# Patient Record
Sex: Male | Born: 1970 | Race: White | Hispanic: No | Marital: Single | Smoking: Never smoker
Health system: Southern US, Community
[De-identification: ages and names within clinical notes are randomized; demographics above are authoritative.]

## PROBLEM LIST (undated history)

## (undated) DIAGNOSIS — T7840XA Allergy, unspecified, initial encounter: Secondary | ICD-10-CM

## (undated) DIAGNOSIS — N200 Calculus of kidney: Secondary | ICD-10-CM

## (undated) DIAGNOSIS — I2510 Atherosclerotic heart disease of native coronary artery without angina pectoris: Secondary | ICD-10-CM

## (undated) DIAGNOSIS — F3341 Major depressive disorder, recurrent, in partial remission: Secondary | ICD-10-CM

## (undated) DIAGNOSIS — K219 Gastro-esophageal reflux disease without esophagitis: Secondary | ICD-10-CM

## (undated) DIAGNOSIS — E785 Hyperlipidemia, unspecified: Secondary | ICD-10-CM

## (undated) DIAGNOSIS — F32A Depression, unspecified: Secondary | ICD-10-CM

## (undated) DIAGNOSIS — Z973 Presence of spectacles and contact lenses: Secondary | ICD-10-CM

## (undated) DIAGNOSIS — I480 Paroxysmal atrial fibrillation: Secondary | ICD-10-CM

## (undated) DIAGNOSIS — M1990 Unspecified osteoarthritis, unspecified site: Secondary | ICD-10-CM

## (undated) DIAGNOSIS — G4730 Sleep apnea, unspecified: Secondary | ICD-10-CM

## (undated) DIAGNOSIS — I219 Acute myocardial infarction, unspecified: Secondary | ICD-10-CM

## (undated) DIAGNOSIS — F329 Major depressive disorder, single episode, unspecified: Secondary | ICD-10-CM

## (undated) HISTORY — DX: Hyperlipidemia, unspecified: E78.5

## (undated) HISTORY — DX: Gastro-esophageal reflux disease without esophagitis: K21.9

## (undated) HISTORY — DX: Sleep apnea, unspecified: G47.30

## (undated) HISTORY — DX: Calculus of kidney: N20.0

## (undated) HISTORY — DX: Major depressive disorder, single episode, unspecified: F32.9

## (undated) HISTORY — PX: CORONARY ANGIOPLASTY WITH STENT PLACEMENT: SHX49

## (undated) HISTORY — DX: Atherosclerotic heart disease of native coronary artery without angina pectoris: I25.10

## (undated) HISTORY — PX: OTHER SURGICAL HISTORY: SHX169

## (undated) HISTORY — DX: Allergy, unspecified, initial encounter: T78.40XA

## (undated) HISTORY — DX: Unspecified osteoarthritis, unspecified site: M19.90

## (undated) HISTORY — DX: Acute myocardial infarction, unspecified: I21.9

## (undated) HISTORY — DX: Depression, unspecified: F32.A

## (undated) HISTORY — DX: Major depressive disorder, recurrent, in partial remission: F33.41

## (undated) HISTORY — PX: TYMPANOSTOMY TUBE PLACEMENT: SHX32

## (undated) HISTORY — PX: SPINE SURGERY: SHX786

---

## 2009-07-30 ENCOUNTER — Emergency Department: Payer: Self-pay | Admitting: Emergency Medicine

## 2012-12-03 ENCOUNTER — Emergency Department: Payer: Self-pay | Admitting: Emergency Medicine

## 2014-05-19 DIAGNOSIS — I219 Acute myocardial infarction, unspecified: Secondary | ICD-10-CM

## 2014-05-19 HISTORY — DX: Acute myocardial infarction, unspecified: I21.9

## 2014-05-20 DIAGNOSIS — I4891 Unspecified atrial fibrillation: Secondary | ICD-10-CM | POA: Insufficient documentation

## 2014-06-16 DIAGNOSIS — I480 Paroxysmal atrial fibrillation: Secondary | ICD-10-CM | POA: Insufficient documentation

## 2014-06-16 DIAGNOSIS — I2510 Atherosclerotic heart disease of native coronary artery without angina pectoris: Secondary | ICD-10-CM | POA: Insufficient documentation

## 2014-06-16 DIAGNOSIS — E785 Hyperlipidemia, unspecified: Secondary | ICD-10-CM | POA: Insufficient documentation

## 2014-06-24 ENCOUNTER — Telehealth: Payer: Self-pay

## 2014-06-24 NOTE — Telephone Encounter (Signed)
Today @ 9:44am  Patient was asked what type of a reaction he was having and to what medication. Patient stated he was having some issues with a rash and that we would not have this medication on our list because we did prescribe it to him. He stated it was a generic to Plavix. After consulting with Dr. Edwena Felty, I then advise him that he should contact the doctor's office that prescribed it to him since this occurred after the input of his cardiac stents and starting this new medication. Patient said, "fine."  [place]

## 2014-07-09 ENCOUNTER — Ambulatory Visit
Admission: RE | Admit: 2014-07-09 | Discharge: 2014-07-09 | Disposition: A | Discharge status: Home / Self Care | Payer: BC Managed Care – PPO | Source: Ambulatory Visit | Attending: Family Medicine | Admitting: Family Medicine

## 2014-07-09 ENCOUNTER — Ambulatory Visit (INDEPENDENT_AMBULATORY_CARE_PROVIDER_SITE_OTHER): Payer: BC Managed Care – PPO | Admitting: Family Medicine

## 2014-07-09 ENCOUNTER — Encounter: Payer: Self-pay | Admitting: Family Medicine

## 2014-07-09 ENCOUNTER — Encounter (INDEPENDENT_AMBULATORY_CARE_PROVIDER_SITE_OTHER): Payer: Self-pay

## 2014-07-09 VITALS — BP 102/64 | HR 53 | Temp 98.7°F | Resp 14 | Ht 70.0 in | Wt 232.5 lb

## 2014-07-09 DIAGNOSIS — F988 Other specified behavioral and emotional disorders with onset usually occurring in childhood and adolescence: Secondary | ICD-10-CM | POA: Insufficient documentation

## 2014-07-09 DIAGNOSIS — M5441 Lumbago with sciatica, right side: Secondary | ICD-10-CM

## 2014-07-09 DIAGNOSIS — M1A4790 Other secondary chronic gout, unspecified ankle and foot, without tophus (tophi): Secondary | ICD-10-CM | POA: Insufficient documentation

## 2014-07-09 DIAGNOSIS — M5440 Lumbago with sciatica, unspecified side: Secondary | ICD-10-CM | POA: Diagnosis not present

## 2014-07-09 DIAGNOSIS — F3341 Major depressive disorder, recurrent, in partial remission: Secondary | ICD-10-CM

## 2014-07-09 DIAGNOSIS — G4733 Obstructive sleep apnea (adult) (pediatric): Secondary | ICD-10-CM | POA: Insufficient documentation

## 2014-07-09 DIAGNOSIS — N200 Calculus of kidney: Secondary | ICD-10-CM | POA: Insufficient documentation

## 2014-07-09 DIAGNOSIS — M489 Spondylopathy, unspecified: Secondary | ICD-10-CM | POA: Insufficient documentation

## 2014-07-09 DIAGNOSIS — R42 Dizziness and giddiness: Secondary | ICD-10-CM | POA: Diagnosis not present

## 2014-07-09 DIAGNOSIS — J45909 Unspecified asthma, uncomplicated: Secondary | ICD-10-CM | POA: Diagnosis not present

## 2014-07-09 DIAGNOSIS — E785 Hyperlipidemia, unspecified: Secondary | ICD-10-CM | POA: Insufficient documentation

## 2014-07-09 DIAGNOSIS — M545 Low back pain: Secondary | ICD-10-CM | POA: Diagnosis present

## 2014-07-09 DIAGNOSIS — I2510 Atherosclerotic heart disease of native coronary artery without angina pectoris: Secondary | ICD-10-CM | POA: Insufficient documentation

## 2014-07-09 DIAGNOSIS — IMO0001 Reserved for inherently not codable concepts without codable children: Secondary | ICD-10-CM | POA: Insufficient documentation

## 2014-07-09 DIAGNOSIS — I252 Old myocardial infarction: Secondary | ICD-10-CM | POA: Insufficient documentation

## 2014-07-09 DIAGNOSIS — R21 Rash and other nonspecific skin eruption: Secondary | ICD-10-CM | POA: Diagnosis not present

## 2014-07-09 DIAGNOSIS — G4730 Sleep apnea, unspecified: Secondary | ICD-10-CM | POA: Insufficient documentation

## 2014-07-09 DIAGNOSIS — K219 Gastro-esophageal reflux disease without esophagitis: Secondary | ICD-10-CM | POA: Insufficient documentation

## 2014-07-09 HISTORY — DX: Major depressive disorder, recurrent, in partial remission: F33.41

## 2014-07-09 IMAGING — CR DG LUMBAR SPINE COMPLETE 4+V
1 series · 5 of 5 positions shown · non-contrast
Comparison: None

CLINICAL DATA: Lower back pain for 2-3 months, asthma and playing
tennis recently, midline low back pain with RIGHT-sided sciatica

EXAM:
LUMBAR SPINE - COMPLETE 4+ VIEW

[Series 1: dg lumbar spine complete 4 +v · 0.14mm/px · 5 of 5 slices shown]
[im 1/5]
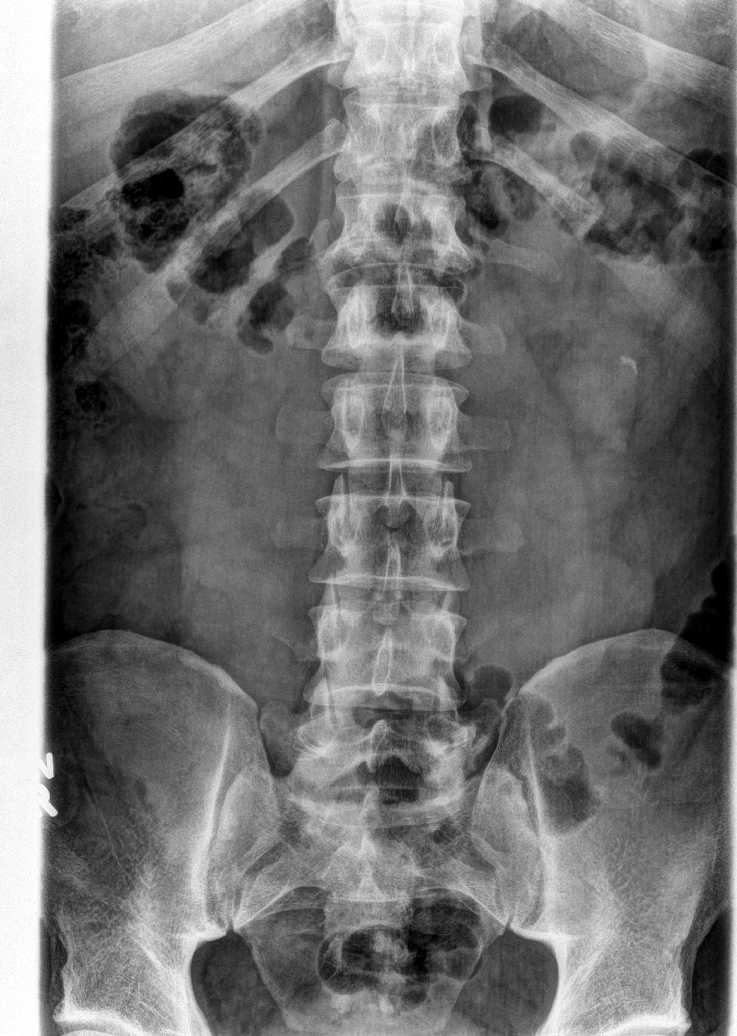
[im 2/5]
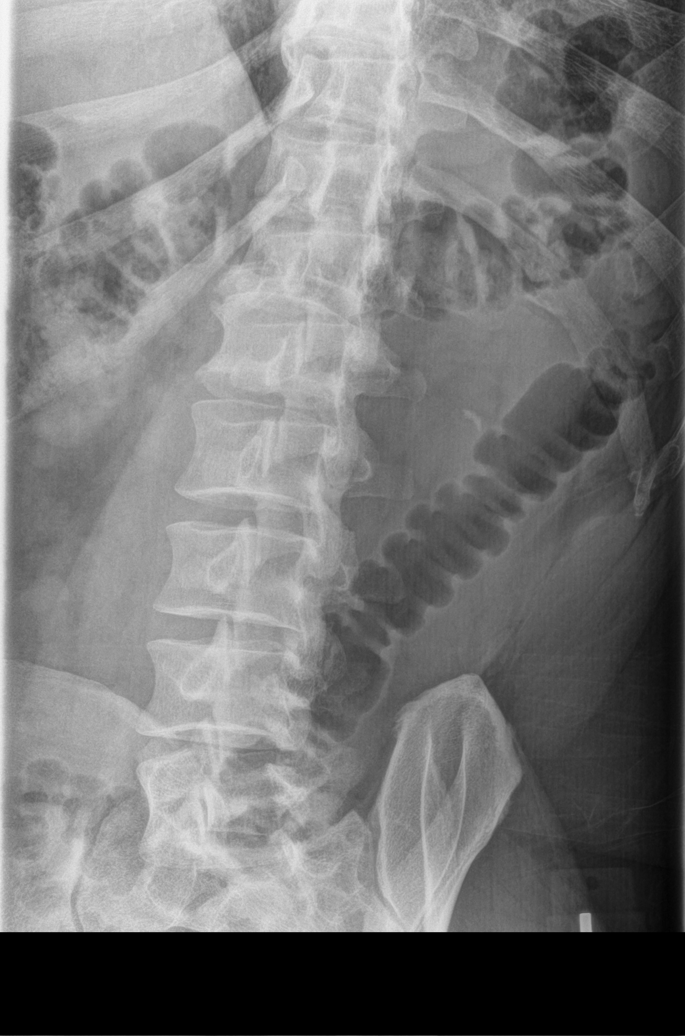
[im 3/5]
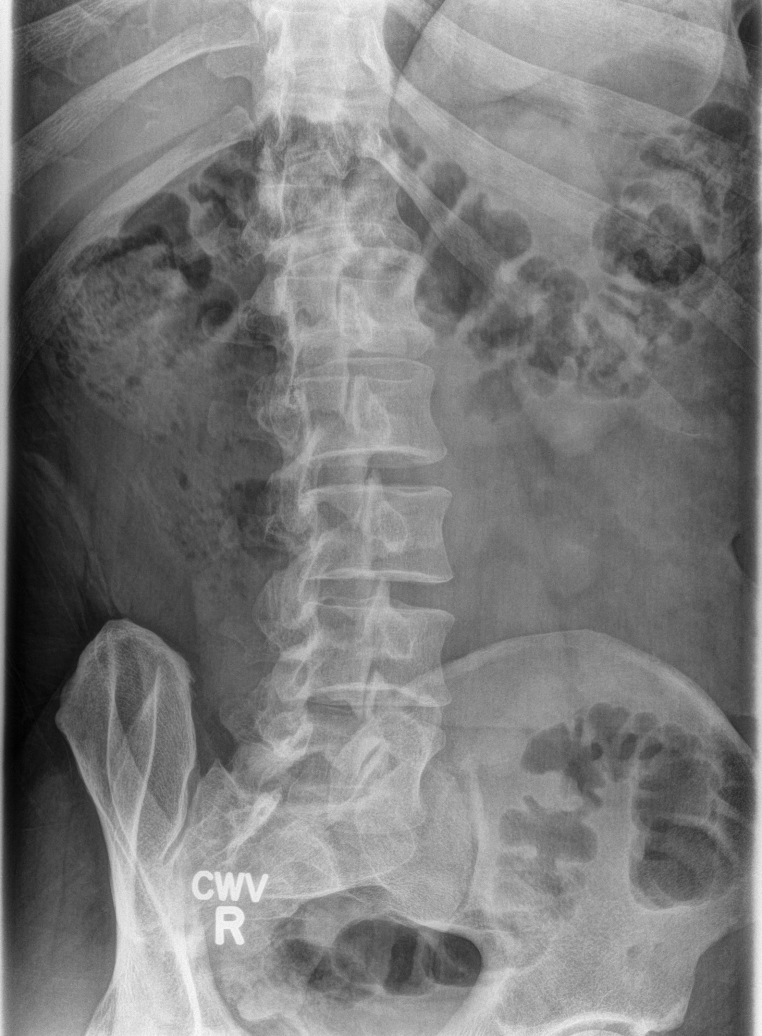
[im 4/5]
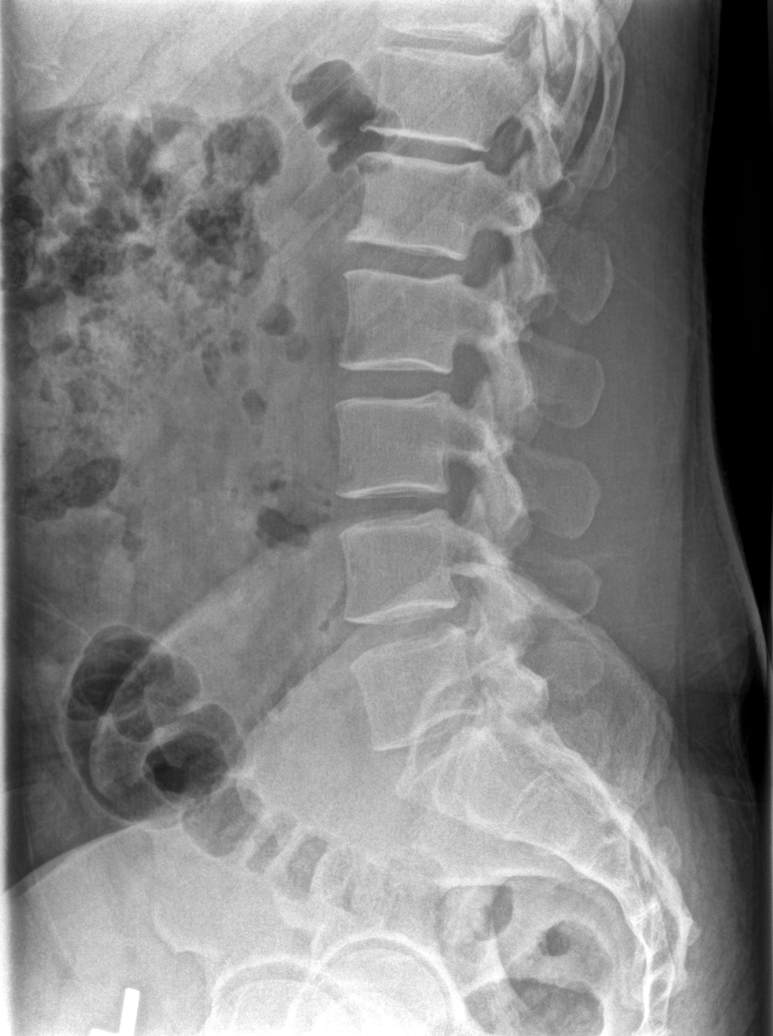
[im 5/5]
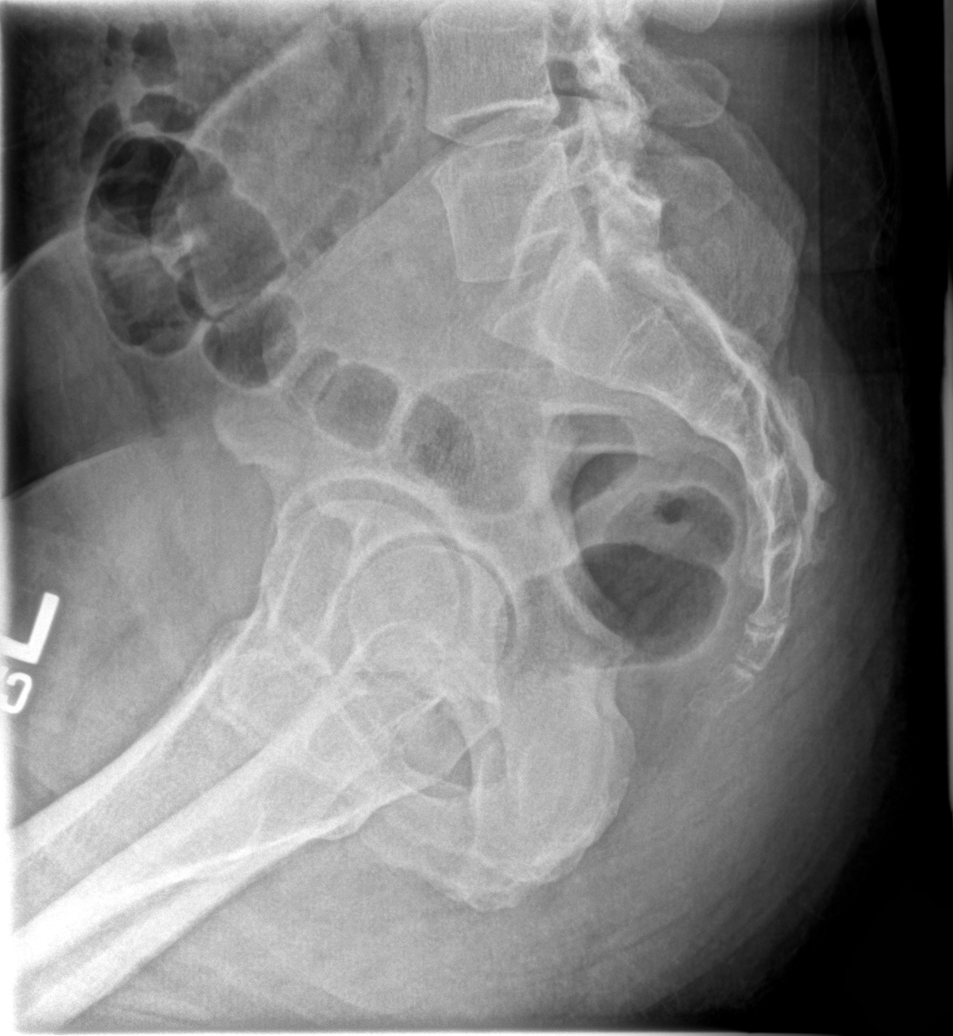

[5 of 5 positions shown; findings below may reference images not displayed]

FINDINGS: Hypoplastic last rib pair.

5 non-rib-bearing lumbar vertebra.

Vertebral body and disc space heights maintained.

Tiny inferior endplate spurs at T12 and L1.

No acute fracture, subluxation or bone destruction.

No spondylolysis.

SI joints symmetric.

10 x 3 mm calculus projects over inferior pole LEFT kidney.
IMPRESSION: No acute lumbar spine abnormalities.

10 x 3 mm LEFT renal calculus.

## 2014-07-09 NOTE — Progress Notes (Signed)
Name: Melvin Hatfield   MRN: [id number]    DOB: May 26, 1970   Date:07/09/2014       Progress Note  Subjective  Chief Complaint  Chief Complaint  Patient presents with  . Allergic Reaction    patient thinks it may be a reaction to a switch in medication.   . Dizziness    patient states he gets light-headed everytime he stands up  . Fatigue    patient states he has no energy    HPI  Rash: Patient complains of rash involving the abdomen, back, generalized, bilateral lower leg, torso and trunk. Rash started 3 weeks ago. Appearance of rash at onset: Color of lesion(s): pink. Rash has changed over time Initial distribution: less involved on bilateral arms.  Discomfort associated with rash: causes no discomfort.  Associated symptoms: none. Denies: abdominal pain, arthralgia, congestion, cough, crankiness, decrease in appetite, decrease in energy level, fever, headache, irritability, myalgia, nausea, sore throat and vomiting. Patient has had previous evaluation of rash. Patient has not had previous treatment.  Patient has not had contacts with similar rash. Patient has not identified precipitant. Patient has had new exposures: medications.  Joint/Muscle Pain: Patient complains of arthralgias for which has been present for several months. Pain is located in lower back, is described as aching, throbbing and tight band, and is intermittent .  Associated symptoms include: decreased range of motion.  The patient has planned to see Chiropracter.  Related to injury:   Playing table tennis agressively.  Patient Active Problem List   Diagnosis Date Noted  . GERD without esophagitis 07/09/2014  . Sleep apnea in adult 07/09/2014  . 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 Obesity, Class II, BMI 35-39.9, with comorbidity 07/09/2014  . Hyperlipidemia LDL goal <70 07/09/2014  . History of myocardial infarct at age less than 60 years 07/09/2014  .  Coronary artery disease involving native coronary artery without angina pectoris 07/09/2014  . 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 Depression 07/09/2014  . CAD in native artery 06/16/2014  . Dyslipidemia 06/16/2014  . A-fib 06/16/2014  . Atrial fibrillation with rapid ventricular response 05/20/2014    History  Substance Use Topics  . Smoking status: Never Smoker   . Smokeless tobacco: Not on file  . Alcohol Use: No     Current outpatient prescriptions:  .  amiodarone (PACERONE) 200 MG tablet, Take 200 mg by mouth., Disp: , Rfl:  .  aspirin 81 MG chewable tablet, Chew 81 mg by mouth., Disp: , Rfl:  .  atorvastatin (LIPITOR) 20 MG tablet, Take 20 mg by mouth., Disp: , Rfl:  .  clopidogrel (PLAVIX) 75 MG tablet, Take 75 mg by mouth., Disp: , Rfl:  .  sertraline (ZOLOFT) 100 MG tablet, Take 50 mg by mouth., Disp: , Rfl:   Past Surgical History  Procedure Laterality Date  . Coronary angioplasty with stent placement    . Spine surgery    . Reconstructed ear drum    . Tympanostomy tube placement      patient states he has had 6    Family History  Problem Relation Age of Onset  . Cancer Mother   . Depression Mother     No Known Allergies   Review of Systems  Ten systems reviewed and is negative except as mentioned in HPI.    Objective  BP 102/64 mmHg  Pulse 53  Temp(Src) 98.7 F (37.1 C) (Oral)  Resp 14  Ht  (1.778 m)  Wt 232 lb 8 oz (105.461 kg)  BMI 33.36 kg/m2  SpO2 96%  Physical Exam  Constitutional: Patient appears well-developed and well-nourished. In no distress.  Neck: Normal range of motion. Neck supple. No JVD present. No thyromegaly present.  Cardiovascular: Normal rate, regular rhythm and normal heart sounds.  No murmur heard.  Pulmonary/Chest: Effort normal and breath sounds normal. No respiratory distress. Musculoskeletal: Normal range of motion bilateral UE and LE, no joint effusions. Lumbar spine no step  off, right paraspinal muscle spasm and tenderness noted. Normal truncal movements. Peripheral vascular: Bilateral LE no edema. Neurological: CN II-XII grossly intact with no focal deficits. Alert and oriented to person, place, and time. Coordination, balance, strength, speech and gait are normal.  Skin: Skin is warm and dry. Pink fine macular rash distributed on bilateral legs, abdomen and back. No warmth, scaly patches, ulcers, herald patches.  Psychiatric: Patient has a normal mood and affect. Behavior is normal in office today. Judgment and thought content normal in office today.   Assessment & Plan  1. Rash and nonspecific skin eruption Very mild, may be due to new medications but difficult to pinpoint which of his medications it could be. I do not recommend stopping any of his medications due to recent MI history. Continue with medications and monitor his rash as it is mild and may resolve on it's own. I do not feel that there is a systemic internal involvement at this time.  2. Midline low back pain with right-sided sciatica X-ray of lumbar spine ordered.   - DG Lumbar Spine Complete; Future  3. Dizziness Likely due to bradycardia, recent MI, amiodarone. Be mindful of position changes, keep well hydrated.

## 2014-07-09 NOTE — Patient Instructions (Signed)
Back Exercises  Back exercises help treat and prevent back injuries. The goal of back exercises is to increase the strength of your abdominal and back muscles and the flexibility of your back. These exercises should be started when you no longer have back pain. Back exercises include:  · Pelvic Tilt. Lie on your back with your knees bent. Tilt your pelvis until the lower part of your back is against the floor. Hold this position 5 to 10 sec and repeat 5 to 10 times.  · Knee to Chest. Pull first 1 knee up against your chest and hold for 20 to 30 seconds, repeat this with the other knee, and then both knees. This may be done with the other leg straight or bent, whichever feels better.  · Sit-Ups or Curl-Ups. Bend your knees 90 degrees. Start with tilting your pelvis, and do a partial, slow sit-up, lifting your trunk only 30 to 45 degrees off the floor. Take at least 2 to 3 seconds for each sit-up. Do not do sit-ups with your knees out straight. If partial sit-ups are difficult, simply do the above but with only tightening your abdominal muscles and holding it as directed.  · Hip-Lift. Lie on your back with your knees flexed 90 degrees. Push down with your feet and shoulders as you raise your hips a couple inches off the floor; hold for 10 seconds, repeat 5 to 10 times.  · Back arches. Lie on your stomach, propping yourself up on bent elbows. Slowly press on your hands, causing an arch in your low back. Repeat 3 to 5 times. Any initial stiffness and discomfort should lessen with repetition over time.  · Shoulder-Lifts. Lie face down with arms beside your body. Keep hips and torso pressed to floor as you slowly lift your head and shoulders off the floor.  Do not overdo your exercises, especially in the beginning. Exercises may cause you some mild back discomfort which lasts for a few minutes; however, if the pain is more severe, or lasts for more than 15 minutes, do not continue exercises until you see your caregiver.  Improvement with exercise therapy for back problems is slow.   See your caregivers for assistance with developing a proper back exercise program.  Document Released: 02/17/2004 Document Revised: 04/03/2011 Document Reviewed: 11/10/2010  ExitCare® Patient Information ©2015 [employer]. This information is not intended to replace advice given to you by your health care provider. Make sure you discuss any questions you have with your health care provider.    Chronic Back Pain   When back pain lasts longer than 3 months, it is called chronic back pain. People with chronic back pain often go through certain periods that are more intense (flare-ups).   CAUSES  Chronic back pain can be caused by wear and tear (degeneration) on different structures in your back. These structures include:  · The bones of your spine (vertebrae) and the joints surrounding your spinal cord and nerve roots (facets).  · The strong, fibrous tissues that connect your vertebrae (ligaments).  Degeneration of these structures may result in pressure on your nerves. This can lead to constant pain.  HOME CARE INSTRUCTIONS  · Avoid bending, heavy lifting, prolonged sitting, and activities which make the problem worse.  · Take brief periods of rest throughout the day to reduce your pain. Lying down or standing usually is better than sitting while you are resting.  · Take over-the-counter or prescription medicines only as directed by your caregiver.  SEEK   IMMEDIATE MEDICAL CARE IF:   · You have weakness or numbness in one of your legs or feet.  · You have trouble controlling your bladder or bowels.  · You have nausea, vomiting, abdominal pain, shortness of breath, or fainting.  Document Released: 02/17/2004 Document Revised: 04/03/2011 Document Reviewed: 12/24/2010  ExitCare® Patient Information ©2015 [employer]. This information is not intended to replace advice given to you by your health care provider. Make sure you discuss any questions you have  with your health care provider.

## 2014-08-03 ENCOUNTER — Other Ambulatory Visit: Payer: Self-pay

## 2014-08-03 ENCOUNTER — Telehealth: Payer: Self-pay | Admitting: Family Medicine

## 2014-08-03 NOTE — Telephone Encounter (Signed)
Patient would like to know what side was the kidney stone found on because he has been having pressure for about 6 months on the left side. Please advise. It is okay to leave a detailed message if he does not answer.

## 2014-08-03 NOTE — Telephone Encounter (Signed)
Contacted patient to inform him that the renal stone was on the left side. Patient then stated that he has been having a lot of symptoms so I asked if he could come in for am office visit. Per his request patient was not scheduled until 08/17/14 at 8:15am.

## 2014-08-17 ENCOUNTER — Ambulatory Visit (INDEPENDENT_AMBULATORY_CARE_PROVIDER_SITE_OTHER): Payer: BC Managed Care – PPO | Admitting: Family Medicine

## 2014-08-17 ENCOUNTER — Encounter: Payer: Self-pay | Admitting: Family Medicine

## 2014-08-17 VITALS — BP 106/70 | HR 47 | Temp 97.5°F | Resp 14 | Ht 70.0 in | Wt 231.4 lb

## 2014-08-17 DIAGNOSIS — L270 Generalized skin eruption due to drugs and medicaments taken internally: Secondary | ICD-10-CM | POA: Diagnosis not present

## 2014-08-17 DIAGNOSIS — N200 Calculus of kidney: Secondary | ICD-10-CM | POA: Insufficient documentation

## 2014-08-17 HISTORY — DX: Calculus of kidney: N20.0

## 2014-08-17 NOTE — Patient Instructions (Signed)

## 2014-08-17 NOTE — Progress Notes (Signed)
Name: Melvin Hatfield   MRN: [id number]    DOB: [birth year]   Date:08/17/2014       Progress Note  Subjective  Chief Complaint  Chief Complaint  Patient presents with  . Nephrolithiasis    patient is here because his symptoms persists from 07/10/14, when a kidney stone was found.    HPI  Melvin Hatfield presents with ongoing back and flank pain and he is concerned it may be related to incidental calculus found on left kidney measuring 10x68mm. He notes not passing anything.  Pain is located in left sides middle to lower back, is described as aching, throbbing and tight band, and is intermittent . Associated symptoms include: decreased range of motion. The patient has planned to see Chiropracter at our last visit but decided against this as Lumbar xray was not significant for spine abnormalities. Playing table tennis aggressively as usual. No unusual urinary complaints. Symptoms are ongoing for 6months now.  Patient Active Problem List   Diagnosis Date Noted  . Left nephrolithiasis 08/17/2014  . Drug-induced skin rash 08/17/2014  . GERD without esophagitis 07/09/2014  . Sleep apnea in adult 07/09/2014  . 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 Obesity, Class II, BMI 35-39.9, with comorbidity 07/09/2014  . Hyperlipidemia LDL goal <70 07/09/2014  . History of myocardial infarct at age less than 60 years 07/09/2014  . Coronary artery disease involving native coronary artery without angina pectoris 07/09/2014  . 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 Depression 07/09/2014  . CAD in native artery 06/16/2014  . Dyslipidemia 06/16/2014  . A-fib 06/16/2014  . Atrial fibrillation with rapid ventricular response 05/20/2014    History  Substance Use Topics  . Smoking status: Never Smoker   . Smokeless tobacco: Not on file  . Alcohol Use: No     Current outpatient prescriptions:  .  amiodarone  (PACERONE) 200 MG tablet, Take 200 mg by mouth., Disp: , Rfl:  .  aspirin 81 MG chewable tablet, Chew 81 mg by mouth., Disp: , Rfl:  .  atorvastatin (LIPITOR) 20 MG tablet, Take 20 mg by mouth., Disp: , Rfl:  .  clopidogrel (PLAVIX) 75 MG tablet, , Disp: , Rfl:  .  sertraline (ZOLOFT) 100 MG tablet, Take 50 mg by mouth., Disp: , Rfl:   Past Surgical History  Procedure Laterality Date  . Coronary angioplasty with stent placement    . Spine surgery    . Reconstructed ear drum    . Tympanostomy tube placement      patient states he has had 6    Family History  Problem Relation Age of Onset  . Cancer Mother   . Depression Mother     No Known Allergies   Review of Systems  CONSTITUTIONAL: No significant weight changes, fever, chills, weakness or fatigue.  HEENT:  - Eyes: No visual changes.  - Ears: No auditory changes. No pain.  - Nose: No sneezing, congestion, runny nose. - Throat: No sore throat. No changes in swallowing. SKIN: Continued rash CARDIOVASCULAR: No chest pain, chest pressure or chest discomfort. No palpitations or edema.  RESPIRATORY: No shortness of breath, cough or sputum.  GASTROINTESTINAL: No anorexia, nausea, vomiting. No changes in bowel habits. No abdominal pain or blood.  GENITOURINARY: No dysuria. No frequency. No discharge.  NEUROLOGICAL: No headache, dizziness, syncope, paralysis, ataxia, numbness or tingling in the extremities. No memory changes. No change in bowel or bladder  control.  MUSCULOSKELETAL: Left flank pain. HEMATOLOGIC: No anemia, bleeding or bruising.  LYMPHATICS: No enlarged lymph nodes.  PSYCHIATRIC: No change in mood. No change in sleep pattern.  ENDOCRINOLOGIC: No reports of sweating, cold or heat intolerance. No polyuria or polydipsia.      Objective  BP 106/70 mmHg  Pulse 47  Temp(Src) 97.5 F (36.4 C) (Oral)  Resp 14  Ht 5\' 10"  (1.778 m)  Wt 231 lb 6.4 oz (104.962 kg)  BMI 33.20 kg/m2  SpO2 95% Body mass index is 33.2  kg/(m^2).  Physical Exam  Constitutional: Patient appears well-developed and well-nourished. In no distress.  Neck: Normal range of motion. Neck supple. No JVD present. No thyromegaly present.  Cardiovascular: Normal rate, regular rhythm and normal heart sounds. No murmur heard.  Pulmonary/Chest: Effort normal and breath sounds normal. No respiratory distress. Musculoskeletal: Normal range of motion bilateral UE and LE, no joint effusions. Lumbar spine no step off, right paraspinal muscle spasm and tenderness noted. Normal truncal movements. Some mild vague pain reproduced upon pounding over left flank. Peripheral vascular: Bilateral LE no edema. Neurological: CN II-XII grossly intact with no focal deficits. Alert and oriented to person, place, and time. Coordination, balance, strength, speech and gait are normal.  Skin: Skin is warm and dry. Pink fine macular rash distributed on bilateral legs, abdomen and back. No warmth, scaly patches, ulcers, herald patches.  Psychiatric: Patient has a normal mood and affect. Behavior is normal in office today. Judgment and thought content normal in office today.  Assessment & Plan  1. Left nephrolithiasis Refer to Urology to address 10x20mm left kidney stone.  - Ambulatory referral to Urology  2. Drug-induced skin rash Stable

## 2014-08-18 ENCOUNTER — Telehealth: Payer: Self-pay | Admitting: Family Medicine

## 2014-08-18 NOTE — Telephone Encounter (Signed)
Pt called in stating that he did not come by to check out yesterday, but wanted to know if we knew that you where referring him out. When I told him that I see that his provider had put in a order for him to go to urology, he then states that it was suppose to nephrology due to a kidney stone. He wanted me to confirm with you which specialist you are wanting him to see.

## 2014-08-18 NOTE — Telephone Encounter (Signed)
Contacted this patient to inform him that he was being referred to the appropriate specialist and that someone from that office will give him a call to set an appt.

## 2014-08-18 NOTE — Telephone Encounter (Signed)
Please clarify to Melvin Hatfield that he needs to see a UROLOGIST for kidney stones and Nephrologists are for poor kidney functioning. He has been referred to [facility] appropriately.

## 2014-09-09 ENCOUNTER — Telehealth: Payer: Self-pay | Admitting: Family Medicine

## 2014-09-09 ENCOUNTER — Other Ambulatory Visit: Payer: Self-pay | Admitting: Family Medicine

## 2014-09-09 DIAGNOSIS — N200 Calculus of kidney: Secondary | ICD-10-CM

## 2014-09-09 MED ORDER — OXYCODONE-ACETAMINOPHEN 5-325 MG PO TABS: 1.0000 | ORAL_TABLET | Freq: Four times a day (QID) | ORAL | Status: DC | PRN

## 2014-09-09 NOTE — Telephone Encounter (Signed)
Patient was informed that rx is ready for pick up.

## 2014-09-09 NOTE — Telephone Encounter (Signed)
Please let the patient know I have printed out Rx for something stronger than Ibuprofen, called Percocet 5-325 mg one every 6 hrs as needed for pain, but the prescription has to be picked up it can't be sent to the pharmacy directly.

## 2014-09-09 NOTE — Telephone Encounter (Signed)
Patient is about to pass a kidney stone and is requesting medication to help relieve the pain. He is already taking ibuprofen but would like something else. Please send to [email or web address]

## 2014-09-22 ENCOUNTER — Ambulatory Visit (INDEPENDENT_AMBULATORY_CARE_PROVIDER_SITE_OTHER): Payer: BC Managed Care – PPO | Admitting: Urology

## 2014-09-22 ENCOUNTER — Encounter: Payer: Self-pay | Admitting: Urology

## 2014-09-22 VITALS — BP 118/78 | HR 50 | Ht 70.0 in | Wt 230.3 lb

## 2014-09-22 DIAGNOSIS — N200 Calculus of kidney: Secondary | ICD-10-CM

## 2014-09-22 LAB — MICROSCOPIC EXAMINATION
Bacteria, UA: NONE SEEN
Epithelial Cells (non renal): NONE SEEN /hpf (ref 0–10)
WBC UA: NONE SEEN /HPF (ref 0–?)

## 2014-09-22 LAB — URINALYSIS, COMPLETE
BILIRUBIN UA: NEGATIVE
Glucose, UA: NEGATIVE
Ketones, UA: NEGATIVE
Leukocytes, UA: NEGATIVE
NITRITE UA: NEGATIVE
PH UA: 6 (ref 5.0–7.5)
PROTEIN UA: NEGATIVE
Specific Gravity, UA: 1.02 (ref 1.005–1.030)
UUROB: 0.2 mg/dL (ref 0.2–1.0)

## 2014-09-22 NOTE — Progress Notes (Signed)
09/22/2014 9:38 PM   Domingo Madeira 07/21/1970 [id number]  Referring provider: Edwena Felty, MD [address] [place], [place] [address]  Chief Complaint  Patient presents with  . Nephrolithiasis    New Patient    HPI:  Patient is a 44 year old white male with a history of a 10 mm left lower pole renal stone that was found incidentally on back x-rays on 07/09/2014.  He states he's been having back pain on and off for the last several months. This is what prompted the lumbar spine x-rays. Over the last 10 days, he's been experiencing pain in the left groin and radiated to the penis. He is not having any flank pain. He is having intermittent urine stream, but he denies any dysuria, gross hematuria or urinary urgency.  He does have a prior history of nephrolithiasis. He states he passed a stone spontaneously about 5 years ago.  He currently is not having any fever, chills, nausea or vomiting.   PMH: Past Medical History  Diagnosis Date  . Allergy   . Depression   . Sleep apnea   . CHF (congestive heart failure)     stents in placed  . GERD (gastroesophageal reflux disease)   . Hyperlipidemia   . Arthritis   . Left nephrolithiasis 08/17/2014    Surgical History: Past Surgical History  Procedure Laterality Date  . Coronary angioplasty with stent placement    . Spine surgery    . Reconstructed ear drum    . Tympanostomy tube placement      patient states he has had 6    Home Medications:    Medication List       This list is accurate as of: 09/22/14  9:38 PM.  Always use your most recent med list.               amiodarone 200 MG tablet  Commonly known as:  PACERONE  Take 200 mg by mouth.     aspirin 81 MG chewable tablet  Chew 81 mg by mouth.     atorvastatin 20 MG tablet  Commonly known as:  LIPITOR  Take 20 mg by mouth.     clopidogrel 75 MG tablet  Commonly known as:  PLAVIX     oxyCODONE-acetaminophen 5-325 MG per tablet  Commonly known  as:  ROXICET  Take 1 tablet by mouth every 6 (six) hours as needed for severe pain.     sertraline 100 MG tablet  Commonly known as:  ZOLOFT  Take 50 mg by mouth.        Allergies: No Known Allergies  Family History: Family History  Problem Relation Age of Onset  . Cancer Mother   . Depression Mother     Social History:  reports that he has never smoked. He does not have any smokeless tobacco history on file. He reports that he does not drink alcohol or use illicit drugs.  ROS: UROLOGY Frequent Urination?: No Hard to postpone urination?: No Burning/pain with urination?: No Get up at night to urinate?: No Leakage of urine?: No Urine stream starts and stops?: Yes Trouble starting stream?: No Do you have to strain to urinate?: No Blood in urine?: No Urinary tract infection?: No Sexually transmitted disease?: No Injury to kidneys or bladder?: No Painful intercourse?: No Weak stream?: No Erection problems?: No Penile pain?: No  Gastrointestinal Nausea?: No Vomiting?: No Indigestion/heartburn?: No Diarrhea?: No Constipation?: No  Constitutional Fever: No Night sweats?: No Weight loss?: No Fatigue?:  No  Skin Skin rash/lesions?: No Itching?: No  Eyes Blurred vision?: No Double vision?: No  Ears/Nose/Throat Sore throat?: No Sinus problems?: No  Hematologic/Lymphatic Swollen glands?: No Easy bruising?: No  Cardiovascular Leg swelling?: No Chest pain?: No  Respiratory Cough?: No Shortness of breath?: No  Endocrine Excessive thirst?: No  Musculoskeletal Back pain?: No Joint pain?: No  Neurological Headaches?: No Dizziness?: No  Psychologic Depression?: No Anxiety?: No  Physical Exam: BP 118/78 mmHg  Pulse 50  Ht 5\' 10"  (1.778 m)  Wt 230 lb 4.8 oz (104.463 kg)  BMI 33.04 kg/m2  Constitutional:  Alert and oriented, No acute distress. HEENT: [place] AT, moist mucus membranes.  Trachea midline, no masses. Cardiovascular: No clubbing,  cyanosis, or edema. Respiratory: Normal respiratory effort, no increased work of breathing. GI: Abdomen is soft, nontender, nondistended, no abdominal masses GU: No CVA tenderness. GU: Patient with circumcised phallus.  Urethral meatus is patent.  No penile discharge. No penile lesions or rashes. Scrotum without lesions, cysts, rashes and/or edema.  Testicles are located scrotally bilaterally. No masses are appreciated in the testicles. Left and right epididymis are normal. Rectal: Patient with  normal sphincter tone. Perineum without scarring or rashes. No rectal masses are appreciated. Prostate is approximately 40 grams, no nodules are appreciated. Seminal vesicles are normal. Skin: No rashes, bruises or suspicious lesions. Lymph: No cervical or inguinal adenopathy. Neurologic: Grossly intact, no focal deficits, moving all 4 extremities. Psychiatric: Normal mood and affect.  Laboratory Data: Results for orders placed or performed in visit on 09/22/14  Microscopic Examination  Result Value Ref Range   WBC, UA None seen 0 -  5 /hpf   RBC, UA 0-2 0 -  2 /hpf   Epithelial Cells (non renal) None seen 0 - 10 /hpf   Mucus, UA Present (A) Not Estab.   Bacteria, UA None seen None seen/Few  Urinalysis, Complete  Result Value Ref Range   Specific Gravity, UA 1.020 1.005 - 1.030   pH, UA 6.0 5.0 - 7.5   Color, UA Yellow Yellow   Appearance Ur Clear Clear   Leukocytes, UA Negative Negative   Protein, UA Negative Negative/Trace   Glucose, UA Negative Negative   Ketones, UA Negative Negative   RBC, UA 1+ (A) Negative   Bilirubin, UA Negative Negative   Urobilinogen, Ur 0.2 0.2 - 1.0 mg/dL   Nitrite, UA Negative Negative   Microscopic Examination See below:      Pertinent Imaging: CLINICAL DATA: Lower back pain for 2-3 months, asthma and playing tennis recently, midline low back pain with RIGHT-sided sciatica  EXAM: LUMBAR SPINE - COMPLETE 4+ VIEW  COMPARISON:  None  FINDINGS: Hypoplastic last rib pair.  5 non-rib-bearing lumbar vertebra.  Vertebral body and disc space heights maintained.  Tiny inferior endplate spurs at H96 and L1.  No acute fracture, subluxation or bone destruction.  No spondylolysis.  SI joints symmetric.  10 x 3 mm calculus projects over inferior pole LEFT kidney.  IMPRESSION: No acute lumbar spine abnormalities.  10 x 3 mm LEFT renal calculus.   Electronically Signed  By: Ulyses Southward M.D.  On: 07/10/2014 08:58  Assessment & Plan:    1. Left nephrolithiasis:   Patient with findings of a 10 x 3 mm left renal calculus now having symptomatology resembling more of a ureteral stone.  We'll obtain a renal stone protocol CT for further evaluation.  Patient is to contact the office if he should develop intractable pain or vomiting, fevers or  chills or gross hematuria.  - Urinalysis, Complete   Return for CT Urogram report.  Michiel Cowboy, PA-C  [facility] [address], [address] [place], [place] [address] [phone]

## 2014-10-02 ENCOUNTER — Ambulatory Visit
Admission: RE | Admit: 2014-10-02 | Discharge: 2014-10-02 | Disposition: A | Discharge status: Home / Self Care | Payer: BC Managed Care – PPO | Source: Ambulatory Visit | Attending: Urology | Admitting: Urology

## 2014-10-02 DIAGNOSIS — N200 Calculus of kidney: Secondary | ICD-10-CM | POA: Insufficient documentation

## 2014-10-02 IMAGING — CT CT RENAL STONE PROTOCOL
1 of 2 series · 15 of 32 positions shown, 19 images · non-contrast
Comparison: Lumbar spine radiographs [DATE]. No prior exam for
comparison otherwise.

CLINICAL DATA: Left back and left upper quadrant abdominal pain for
6 weeks. Remote history of renal calculi.

EXAM:
CT ABDOMEN AND PELVIS WITHOUT CONTRAST
TECHNIQUE: Multidetector CT imaging of the abdomen and pelvis was performed
following the standard protocol without IV contrast.

[Series 2: soft tissue · axial · 0.71mm/px · z∈[-851,-401]mm · 15 of 100 slices shown, 19 images]
[im 5/100  soft-tissue]
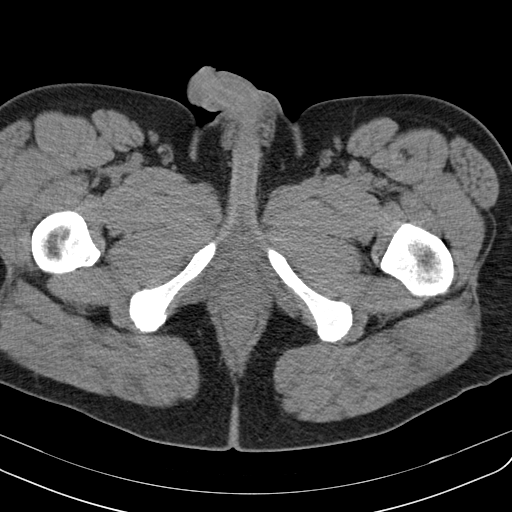
[im 5/100  bone]
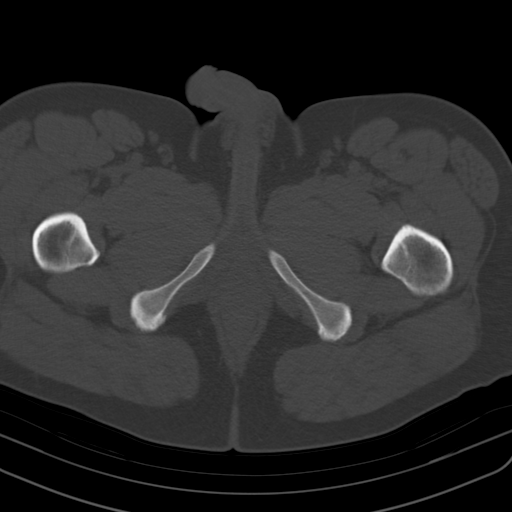
[im 13/100  soft-tissue]
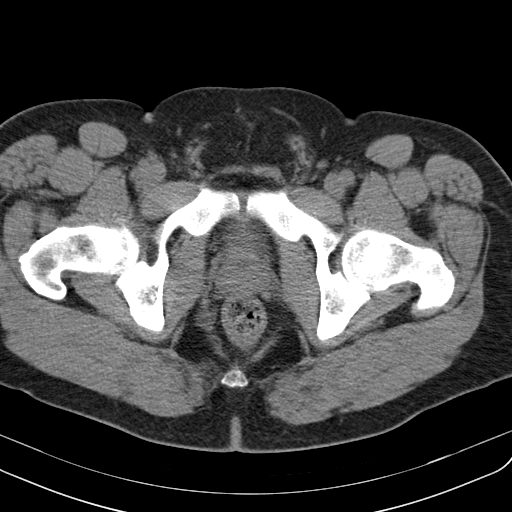
[im 21/100  soft-tissue]
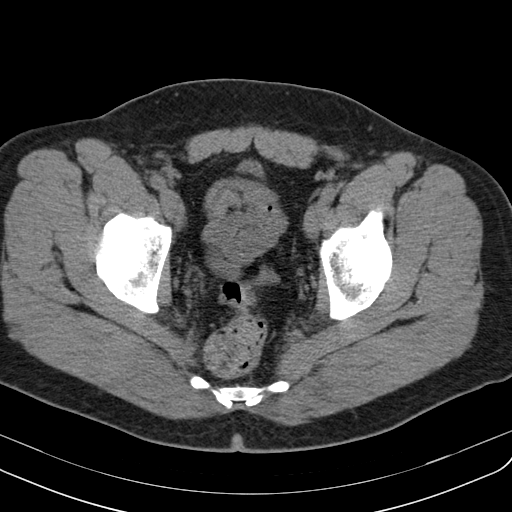
[im 29/100  soft-tissue]
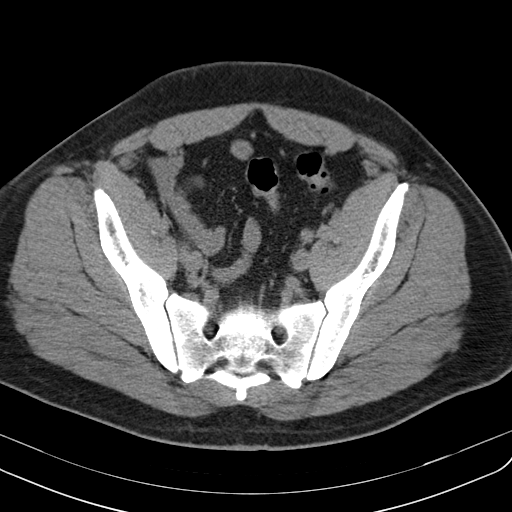
[im 34/100  soft-tissue]
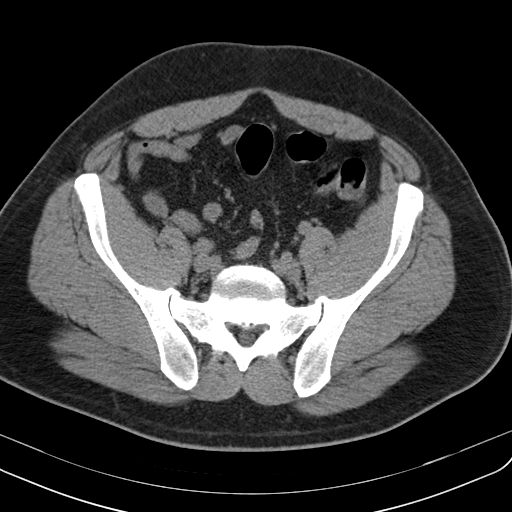
[im 42/100  soft-tissue]
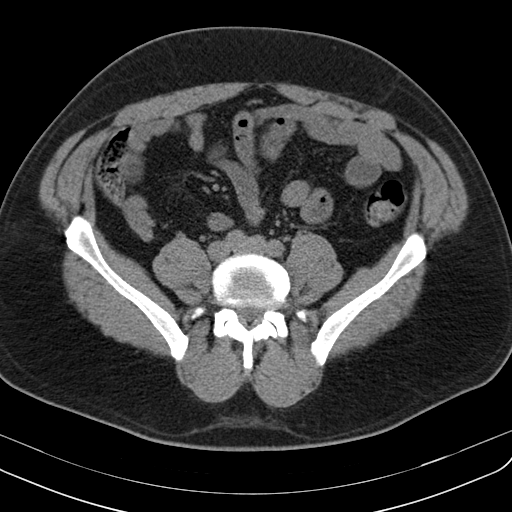
[im 50/100  soft-tissue]
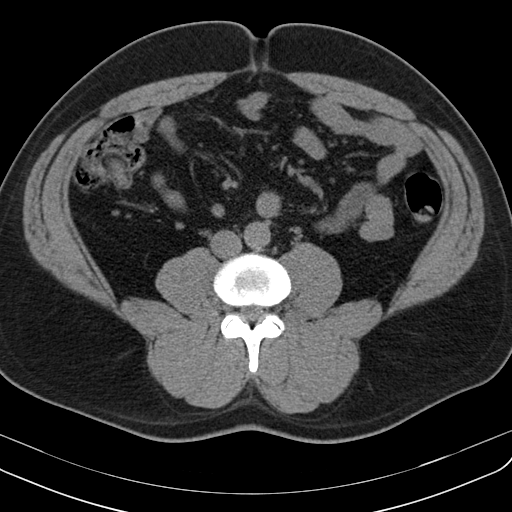
[im 58/100  soft-tissue]
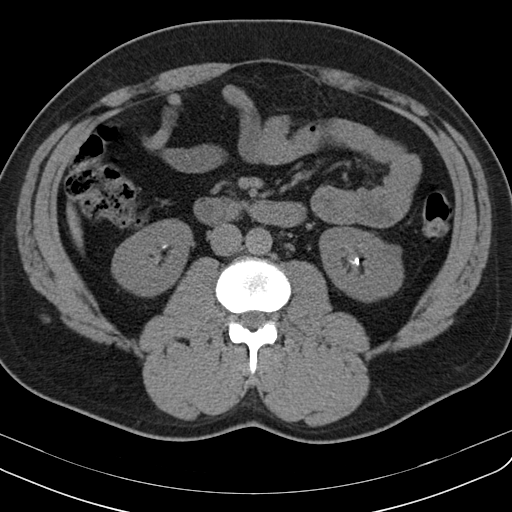
[im 67/100  soft-tissue]
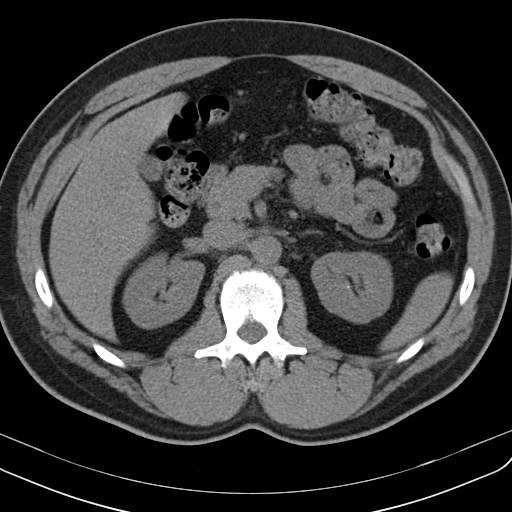
[im 67/100  bone]
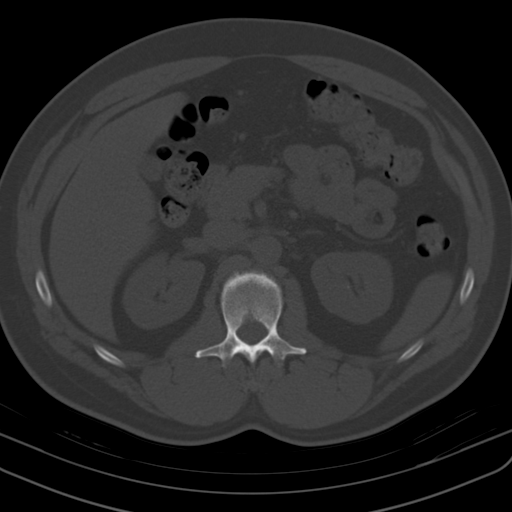
[im 71/100  soft-tissue]
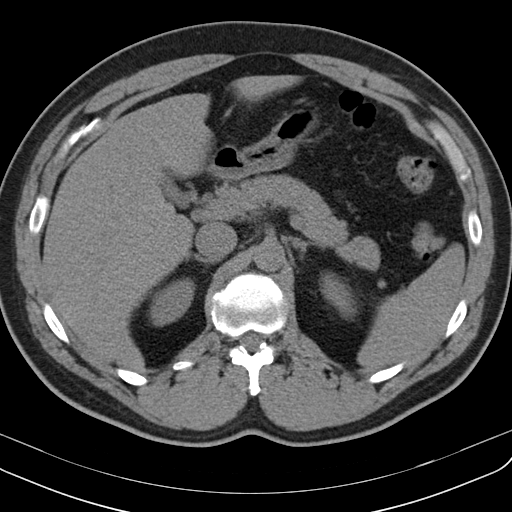
[im 79/100  soft-tissue]
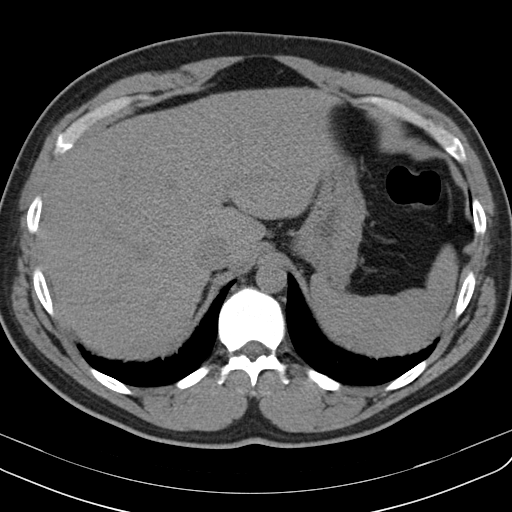
[im 83/100  lung]
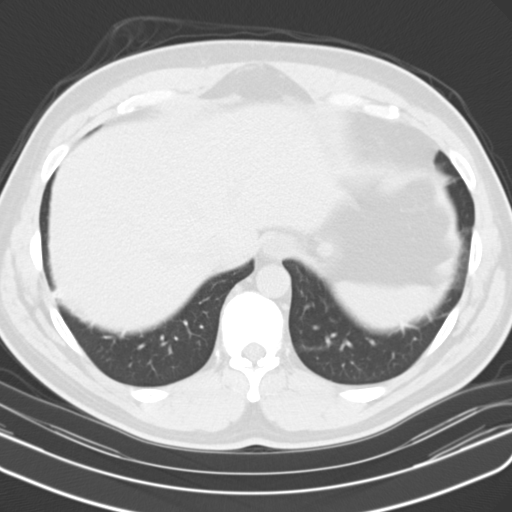
[im 87/100  soft-tissue]
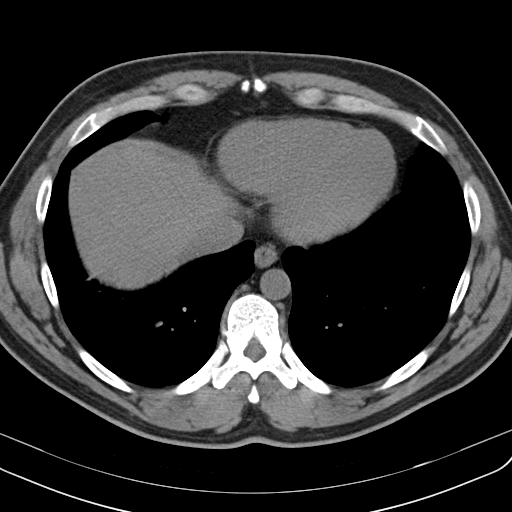
[im 87/100  lung]
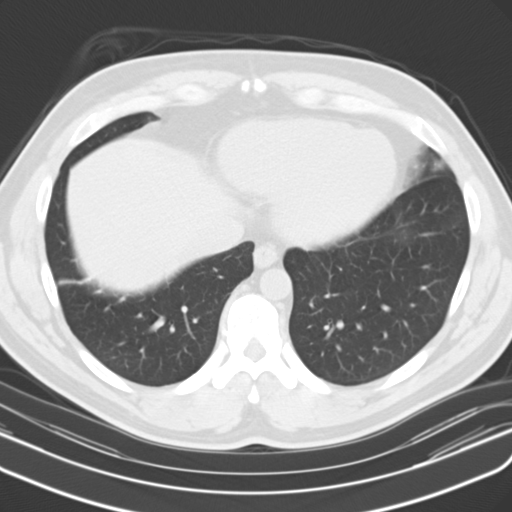
[im 91/100  lung]
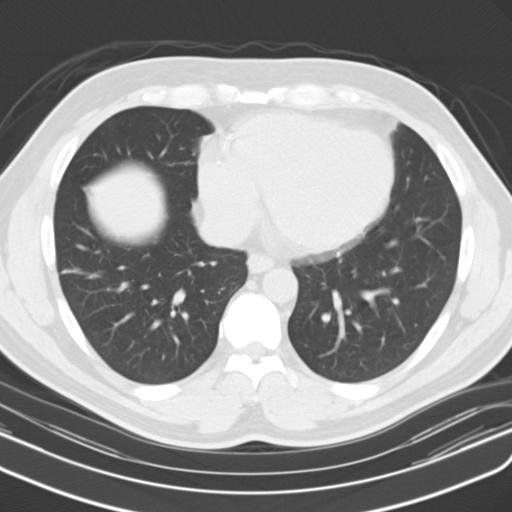
[im 95/100  soft-tissue]
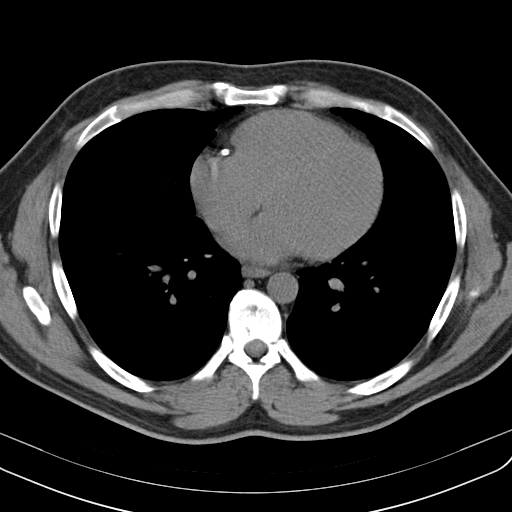
[im 95/100  lung]
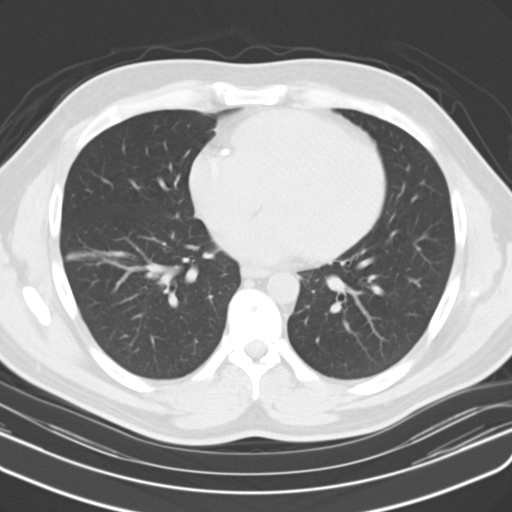

[15 of 32 positions shown; findings below may reference images not displayed]

FINDINGS: Lower chest: Curvilinear bibasilar scarring or atelectasis noted. No
nodule, mass, or consolidation. No pleural effusion. Presumed
coronary arterial stents partly visualized. Heart size normal in
visualized aspects.

Hepatobiliary: Too small to characterize 1 cm hypodense lesion at
the dome of the lateral segment left hepatic lobe image 20.
Gallbladder is normal.

Pancreas: Normal

Spleen: Normal

Adrenals/Urinary Tract: Adrenal glands appear normal. Bilateral
radiopaque renal calculi are identified, largest left lower renal
pole measuring 6 mm. No hydroureteronephrosis. 9 mm too small to
characterize left lower renal pole cortical hypodense lesion
identified image 43. No radiopaque ureteral or bladder calculus.

Stomach/Bowel: Normal appendix. No bowel wall thickening or focal
segmental dilatation is identified.

Vascular/Lymphatic: No aortic aneurysm.  No lymphadenopathy.

Other: No free air or fluid.

Musculoskeletal: No acute osseous abnormality.
IMPRESSION: No acute intra-abdominal or pelvic pathology.

Nonobstructing bilateral renal calculi.

## 2014-10-04 DIAGNOSIS — R079 Chest pain, unspecified: Secondary | ICD-10-CM | POA: Insufficient documentation

## 2014-10-06 ENCOUNTER — Encounter: Payer: Self-pay | Admitting: Urology

## 2014-10-06 ENCOUNTER — Ambulatory Visit (INDEPENDENT_AMBULATORY_CARE_PROVIDER_SITE_OTHER): Payer: BC Managed Care – PPO | Admitting: Urology

## 2014-10-06 VITALS — BP 108/66 | HR 50 | Ht 70.0 in | Wt 231.4 lb

## 2014-10-06 DIAGNOSIS — N2889 Other specified disorders of kidney and ureter: Secondary | ICD-10-CM

## 2014-10-06 DIAGNOSIS — N200 Calculus of kidney: Secondary | ICD-10-CM | POA: Insufficient documentation

## 2014-10-06 NOTE — Progress Notes (Signed)
10/06/2014 9:55 AM   Melvin Hatfield 1970-08-25 [id number]  Referring provider: Edwena Felty, MD [address] [place], [place] [address]  Chief Complaint  Patient presents with  . Results    CT for Left Nephrolithiasis    HPI: Patient is a 44 year old white male who presents today to discuss his CT renal stone study.  CT scan was performed due to the findings of an incidental 10 mm left lower pole renal stone discovered on lumbar spine x-rays.    Patient had been experiencing pain in his left groin which radiated into his penis when he presented to me 2 weeks ago. He was having associated intermittent urine stream and no flank pain. I was fearful that the left lower pole stone had left kidney and was traveling down his left ureter.  He did pass a small fragment a few days ago, but he did not capture it.  Today, he is not feeling any discomfort. He is not having any urinary symptoms. He specifically denies gross hematuria, flank pain, fever, chills, nausea and vomiting.    CT renal stone study completed on 10/02/2014 noted bilateral nephrolithiasis and a 9 mm indeterminate left renal mass. I have reviewed the films with the patient.  PMH: Past Medical History  Diagnosis Date  . Allergy   . Depression   . Sleep apnea   . CHF (congestive heart failure)     stents in placed  . GERD (gastroesophageal reflux disease)   . Hyperlipidemia   . Arthritis   . Left nephrolithiasis 08/17/2014    Surgical History: Past Surgical History  Procedure Laterality Date  . Coronary angioplasty with stent placement    . Spine surgery    . Reconstructed ear drum    . Tympanostomy tube placement      patient states he has had 6    Home Medications:    Medication List       This list is accurate as of: 10/06/14  9:55 AM.  Always use your most recent med list.               amiodarone 200 MG tablet  Commonly known as:  PACERONE  Take 200 mg by mouth.     aspirin 81 MG  chewable tablet  Chew 81 mg by mouth.     atorvastatin 20 MG tablet  Commonly known as:  LIPITOR  Take 20 mg by mouth.     BRILINTA 90 MG Tabs tablet  Generic drug:  ticagrelor  Take 90 mg by mouth.     clopidogrel 75 MG tablet  Commonly known as:  PLAVIX     oxyCODONE-acetaminophen 5-325 MG per tablet  Commonly known as:  ROXICET  Take 1 tablet by mouth every 6 (six) hours as needed for severe pain.     sertraline 100 MG tablet  Commonly known as:  ZOLOFT  Take 50 mg by mouth.        Allergies: No Known Allergies  Family History: Family History  Problem Relation Age of Onset  . Cancer Mother   . Depression Mother   . Kidney disease Neg Hx   . Prostate cancer Neg Hx     Social History:  reports that he has never smoked. He does not have any smokeless tobacco history on file. He reports that he does not drink alcohol or use illicit drugs.  ROS: UROLOGY Frequent Urination?: No Hard to postpone urination?: No Burning/pain with urination?: No Get up  at night to urinate?: No Leakage of urine?: No Urine stream starts and stops?: Yes Trouble starting stream?: No Do you have to strain to urinate?: No Blood in urine?: No Urinary tract infection?: No Sexually transmitted disease?: No Injury to kidneys or bladder?: No Painful intercourse?: No Weak stream?: No Erection problems?: No Penile pain?: No  Gastrointestinal Nausea?: No Vomiting?: No Indigestion/heartburn?: No Diarrhea?: No Constipation?: No  Constitutional Fever: No Night sweats?: No Weight loss?: No Fatigue?: No  Skin Skin rash/lesions?: No Itching?: No  Eyes Blurred vision?: No Double vision?: No  Ears/Nose/Throat Sore throat?: No Sinus problems?: No  Hematologic/Lymphatic Swollen glands?: No Easy bruising?: No  Cardiovascular Leg swelling?: No Chest pain?: No  Respiratory Cough?: No Shortness of breath?: No  Endocrine Excessive thirst?: No  Musculoskeletal Back pain?:  No Joint pain?: No  Neurological Headaches?: No Dizziness?: No  Psychologic Depression?: No Anxiety?: No  Physical Exam: Blood pressure 108/66, pulse 50, height 5\' 10"  (1.778 m), weight 231 lb 6.4 oz (104.962 kg).  Laboratory Data:  Urinalysis    Component Value Date/Time   GLUCOSEU Negative 09/22/2014 1121   BILIRUBINUR Negative 09/22/2014 1121   NITRITE Negative 09/22/2014 1121   LEUKOCYTESUR Negative 09/22/2014 1121    Pertinent Imaging: CLINICAL DATA: Left back and left upper quadrant abdominal pain for 6 weeks. Remote history of renal calculi.  EXAM: CT ABDOMEN AND PELVIS WITHOUT CONTRAST  TECHNIQUE: Multidetector CT imaging of the abdomen and pelvis was performed following the standard protocol without IV contrast.  COMPARISON: Lumbar spine radiographs 07/08/2013. No prior exam for comparison otherwise.  FINDINGS: Lower chest: Curvilinear bibasilar scarring or atelectasis noted. No nodule, mass, or consolidation. No pleural effusion. Presumed coronary arterial stents partly visualized. Heart size normal in visualized aspects.  Hepatobiliary: Too small to characterize 1 cm hypodense lesion at the dome of the lateral segment left hepatic lobe image 20. Gallbladder is normal.  Pancreas: Normal  Spleen: Normal  Adrenals/Urinary Tract: Adrenal glands appear normal. Bilateral radiopaque renal calculi are identified, largest left lower renal pole measuring 6 mm. No hydroureteronephrosis. 9 mm too small to characterize left lower renal pole cortical hypodense lesion identified image 43. No radiopaque ureteral or bladder calculus.  Stomach/Bowel: Normal appendix. No bowel wall thickening or focal segmental dilatation is identified.  Vascular/Lymphatic: No aortic aneurysm. No lymphadenopathy.  Other: No free air or fluid.  Musculoskeletal: No acute osseous abnormality.  IMPRESSION: No acute intra-abdominal or pelvic  pathology.  Nonobstructing bilateral renal calculi.   Electronically Signed  By: Christiana Pellant M.D.  On: 10/02/2014 17:17  Assessment & Plan:    1. Bilateral nephrolithiasis:   Patient has a 2.8 mm right renal stone and a 6 mm left lower renal pole stone.  We discussed URS/LL/stent placement vs ESWL vs continued observation as options for addressing his stones.  He is having financial issues at this time and would like to return in 6 months and readdress the stone at that time.  He would like to have a 24 hour urine test at this time.  I have contacted [facility] and they will contact the patient to set up the test.  He will contact us or go to the ED if he experienced intractable pain or vomiting. He will also contact us or go to the ED if he should develop fevers, chills or gross hematuria.  2. Indeterminate left renal mass:    Patient has a 9 mm too small to characterize left lower renal pole cortical hypodense lesion.  I  explained to the patient that renal masses less than 7 cm have a 20% of having a potentially aggressive histological features and that it is reasonable to follow up studies in 6 months.  We will schedule a RUS in 6 months.  He will contact the office if he develops gross hematuria in the interim.     There are no diagnoses linked to this encounter.  Return in about 6 months (around 04/05/2015) for RUS report.  Michiel Cowboy, PA-C  [facility] [address] [place], [place] [address] [phone]

## 2014-10-09 ENCOUNTER — Other Ambulatory Visit: Payer: Self-pay | Admitting: Family Medicine

## 2014-11-17 ENCOUNTER — Other Ambulatory Visit: Payer: BC Managed Care – PPO

## 2014-11-23 ENCOUNTER — Other Ambulatory Visit: Payer: Self-pay | Admitting: Urology

## 2014-12-09 ENCOUNTER — Telehealth: Payer: Self-pay | Admitting: Urology

## 2014-12-09 NOTE — Telephone Encounter (Signed)
LMOM

## 2014-12-09 NOTE — Telephone Encounter (Signed)
Patient's 24 hour study indicates that he needs to have an intake of sodium to 2300 to 3500 mg/day, calcium to [id number] mg/day from food and 0.8 to 1.3 gm/kg/day of protein a day.  Suggest to repeat the study in 6 weeks.

## 2014-12-11 NOTE — Telephone Encounter (Signed)
Information provided to the patient.  He will adjust his diet and repeat the study in 6 weeks.

## 2015-01-05 ENCOUNTER — Other Ambulatory Visit: Payer: Self-pay | Admitting: Family Medicine

## 2015-01-05 ENCOUNTER — Telehealth: Payer: Self-pay | Admitting: Family Medicine

## 2015-01-05 MED ORDER — TRAZODONE HCL 50 MG PO TABS: 25.0000 mg | ORAL_TABLET | Freq: Every evening | ORAL | Status: DC | PRN

## 2015-01-05 NOTE — Telephone Encounter (Signed)
Pt scheduled appointment for next week to see you pertaining to not sleeping well. He is requesting a prescription for Trizadone. Please send to [facility] [address]

## 2015-01-05 NOTE — Telephone Encounter (Signed)
RX sent to pharmacy  

## 2015-01-12 ENCOUNTER — Encounter: Payer: Self-pay | Admitting: Family Medicine

## 2015-01-12 ENCOUNTER — Ambulatory Visit (INDEPENDENT_AMBULATORY_CARE_PROVIDER_SITE_OTHER): Payer: BC Managed Care – PPO | Admitting: Family Medicine

## 2015-01-12 VITALS — BP 108/63 | HR 59 | Temp 98.0°F | Resp 17 | Ht 70.0 in | Wt 240.3 lb

## 2015-01-12 DIAGNOSIS — G4730 Sleep apnea, unspecified: Secondary | ICD-10-CM

## 2015-01-12 DIAGNOSIS — K2100 Gastro-esophageal reflux disease with esophagitis, without bleeding: Secondary | ICD-10-CM

## 2015-01-12 DIAGNOSIS — S90221A Contusion of right lesser toe(s) with damage to nail, initial encounter: Secondary | ICD-10-CM

## 2015-01-12 DIAGNOSIS — S90229A Contusion of unspecified lesser toe(s) with damage to nail, initial encounter: Secondary | ICD-10-CM | POA: Insufficient documentation

## 2015-01-12 DIAGNOSIS — G4700 Insomnia, unspecified: Secondary | ICD-10-CM

## 2015-01-12 DIAGNOSIS — R0781 Pleurodynia: Secondary | ICD-10-CM

## 2015-01-12 DIAGNOSIS — L989 Disorder of the skin and subcutaneous tissue, unspecified: Secondary | ICD-10-CM | POA: Diagnosis not present

## 2015-01-12 DIAGNOSIS — G4733 Obstructive sleep apnea (adult) (pediatric): Secondary | ICD-10-CM | POA: Diagnosis not present

## 2015-01-12 DIAGNOSIS — R0789 Other chest pain: Secondary | ICD-10-CM

## 2015-01-12 DIAGNOSIS — F3342 Major depressive disorder, recurrent, in full remission: Secondary | ICD-10-CM | POA: Diagnosis not present

## 2015-01-12 MED ORDER — TRAZODONE HCL 100 MG PO TABS: 100.0000 mg | ORAL_TABLET | Freq: Every evening | ORAL | Status: DC | PRN

## 2015-01-12 MED ORDER — SERTRALINE HCL 100 MG PO TABS: 100.0000 mg | ORAL_TABLET | Freq: Every day | ORAL | Status: DC

## 2015-01-12 MED ORDER — OMEPRAZOLE 20 MG PO CPDR: 20.0000 mg | DELAYED_RELEASE_CAPSULE | Freq: Every day | ORAL | Status: DC

## 2015-01-12 NOTE — Progress Notes (Signed)
Name: Melvin Hatfield   MRN: [id number]    DOB: [birth year]   Date:01/12/2015       Progress Note  Subjective  Chief Complaint  Chief Complaint  Patient presents with  . Apnea    trouble sleeping  . Hyperlipidemia    HPI  Melvin Hatfield is a 44 year old male with a history of CAD, history of coronary stent placement after MI, depression, Kidney lesion (q6 month imaging planned), Nephrolithiasis, sleep apnea here today to discuss his ongoing poor sleep. He has been started on Trazadone 50 mg, taking  knocks him out. Is on Brilinta 90 mg a day for anticoagulation, as well as atorvastatin 20 mg a day.   Has a 2nd job now which keeps him up at late certain nights of the week, [employer]. Would like to increase Trazadone to  dose to use PRN. Trying to use CPAP machine consistently. Stomach sleeper. Has had neck fusion as well which makes it hard for him to be comfortable.   Depression is well controled on Zoloft generic 100 mg one a day, needs refill.    Having re-emergence of swallowing issues. Not painful or choking. About 2 years ago had an EGD as he was having difficulty swallowing, found to have inflammation in the esophagus, placed on diet modifications as the inflammation may have been due to food allergies. Not on prescription PPI. Has bought Prilosec/Prevacid and is planning to restart. Can he use along with his blood thinner? Also would like re testing of food allergies.   Has a skin lesion on right hand, sometimes pruritic, slowly growing in size, dry.   Also has dark pigment on right foot 2nd digit near nail bed. Not painful. First noticed a few weeks ago.   He also complains of left lower rib wall pain, sort of anterolateral. Occurs occasionally with movement. When he stretches and he feels it, it is a sharper pain, otherwise it can be a burning type of sensation. Denies chest pain, radiation to left arm, jaw pain.   Asks how to know if he has cancer or not. Denies night  sweats, unexpected weight loss,   Past Medical History  Diagnosis Date  . Allergy   . Depression   . Sleep apnea   . GERD (gastroesophageal reflux disease)   . Hyperlipidemia   . Arthritis   . Left nephrolithiasis 08/17/2014  . Myocardial infarction (HCC)   . Coronary artery disease involving native coronary artery without angina pectoris     Patient Active Problem List   Diagnosis Date Noted  . Left renal mass 10/06/2014  . Nephrolithiasis 10/06/2014  . Left nephrolithiasis 08/17/2014  . Drug-induced skin rash 08/17/2014  . GERD without esophagitis 07/09/2014  . Sleep apnea in adult 07/09/2014  . 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 Obesity, Class II, BMI 35-39.9, with comorbidity (HCC) 07/09/2014  . Hyperlipidemia LDL goal <70 07/09/2014  . History of myocardial infarct at age less than 60 years 07/09/2014  . Coronary artery disease involving native coronary artery without angina pectoris 07/09/2014  . 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 Depression 07/09/2014  . CAD in native artery 06/16/2014  . Dyslipidemia 06/16/2014  . A-fib (HCC) 06/16/2014  . Atrial fibrillation with rapid ventricular response (HCC) 05/20/2014    Social History  Substance Use Topics  . Smoking status: Never Smoker   . Smokeless tobacco: Not on file  . Alcohol  Use: No     Current outpatient prescriptions:  .  amiodarone (PACERONE) 200 MG tablet, Take 200 mg by mouth., Disp: , Rfl:  .  aspirin 81 MG chewable tablet, Chew 81 mg by mouth., Disp: , Rfl:  .  atorvastatin (LIPITOR) 20 MG tablet, Take 20 mg by mouth., Disp: , Rfl:  .  sertraline (ZOLOFT) 100 MG tablet, TAKE ONE TABLET BY MOUTH ONCE DAILY, Disp: 30 tablet, Rfl: 5 .  ticagrelor (BRILINTA) 90 MG TABS tablet, Take 90 mg by mouth., Disp: , Rfl:  .  traZODone (DESYREL) 50 MG tablet, Take 0.5-1 tablets (25-50 mg total) by mouth at bedtime as  needed for sleep., Disp: 14 tablet, Rfl: 0 .  clopidogrel (PLAVIX) 75 MG tablet, Reported on 01/12/2015, Disp: , Rfl:  (DISCONTINUED)  Past Surgical History  Procedure Laterality Date  . Coronary angioplasty with stent placement    . Spine surgery    . Reconstructed ear drum    . Tympanostomy tube placement      patient states he has had 6    Family History  Problem Relation Age of Onset  . Cancer Mother   . Depression Mother   . Kidney disease Neg Hx   . Prostate cancer Neg Hx     No Known Allergies   Review of Systems  CONSTITUTIONAL: No significant weight changes, fever, chills, weakness. Chronic fatigue.  HEENT:  - Eyes: No visual changes.  - Ears: No auditory changes. No pain.  - Nose: No sneezing, congestion, runny nose. - Throat: No sore throat. Yes changes in swallowing. SKIN: Yes skin and nail lesion.  CARDIOVASCULAR: No chest pain, chest pressure or chest discomfort. No palpitations or edema.  RESPIRATORY: No shortness of breath, cough or sputum.  GASTROINTESTINAL: No anorexia, nausea, vomiting. No changes in bowel habits. No abdominal pain or blood.  GENITOURINARY: No dysuria. No frequency. No discharge. NEUROLOGICAL: No headache, dizziness, syncope, paralysis, ataxia, numbness or tingling in the extremities. No memory changes. No change in bowel or bladder control.  MUSCULOSKELETAL: yes joint pain. No muscle pain. HEMATOLOGIC: No anemia, bleeding or bruising.  LYMPHATICS: No enlarged lymph nodes.  PSYCHIATRIC: No change in mood. yes change in sleep pattern.  ENDOCRINOLOGIC: No reports of sweating, cold or heat intolerance. No polyuria or polydipsia.     Objective  BP 108/63 mmHg  Pulse 59  Temp(Src) 98 F (36.7 C) (Oral)  Resp 17  Ht  (1.778 m)  Wt 240 lb 4.8 oz (108.999 kg)  BMI 34.48 kg/m2  SpO2 95% Body mass index is 34.48 kg/(m^2).  Physical Exam  Constitutional: Patient is obese and well-nourished. In no distress.  Cardiovascular: No  reproducible chest wall pain. Normal rate, regular rhythm and normal heart sounds.  No murmur heard.  Pulmonary/Chest: Effort normal and breath sounds normal. No respiratory distress. Abdomen: Soft, non tender, no guarding, no HSM, normal bowel sounds. Musculoskeletal: Normal range of motion bilateral UE and LE, no joint effusions. Neurological: CN II-XII grossly intact with no focal deficits. Alert and oriented to person, place, and time. Coordination, balance, strength, speech and gait are normal.  Skin: Skin is warm and dry. Right hand over Metacarpal bone 4th digit raised dry skin lesion. Psychiatric: Patient has a stable mood and affect. Behavior is normal in office today. Judgment and thought content normal in office today.   Assessment & Plan  1. Skin lesion of right upper extremity  - Ambulatory referral to Dermatology  2. Contusion of toenail, right, initial  encounter Rule out melanoma.  - Ambulatory referral to Dermatology  3. Sleep apnea in adult Continue CPAP machine use, weight loss recommended.   4. Insomnia Increased Trazodone dose. Working well PRN use.  - traZODone (DESYREL) 100 MG tablet; Take 1 tablet (100 mg total) by mouth at bedtime as needed for sleep.  Dispense: 30 tablet; Refill: 5  5. Major depressive disorder, recurrent episode, in full remission (HCC) Stable. Refilled.  - sertraline (ZOLOFT) 100 MG tablet; Take 1 tablet (100 mg total) by mouth daily.  Dispense: 90 tablet; Refill: 2  6. Esophagitis, reflux Re emergence of symptoms. Instructed patient to start Pepcid/Prevacid OTC if symptoms do not improve pick up RX Omeprazole. May reduce anti-coagulant medication effectiveness, take at separate times, patient understands risks. Food allergy testing.   If symptoms persist despite all this will refer to GI for repeat EGD.   - omeprazole (PRILOSEC) 20 MG capsule; Take 1 capsule (20 mg total) by mouth daily.  Dispense: 30 capsule; Refill: 3 - Ambulatory  referral to Allergy  7. Rib pain on left side Less likely to be cardiac at this time but we did discuss this. Reassurance provided regarding cancer. Discussed all normal routine cancer screening protocols, if he continues to have pain we can certainly move forward with imaging. He does have renal scan coming up to surveillance lesion.

## 2015-03-30 ENCOUNTER — Ambulatory Visit
Admission: RE | Admit: 2015-03-30 | Discharge: 2015-03-30 | Disposition: A | Discharge status: Home / Self Care | Payer: BC Managed Care – PPO | Source: Ambulatory Visit | Attending: Urology | Admitting: Urology

## 2015-03-30 DIAGNOSIS — N200 Calculus of kidney: Secondary | ICD-10-CM | POA: Diagnosis not present

## 2015-03-30 DIAGNOSIS — N2889 Other specified disorders of kidney and ureter: Secondary | ICD-10-CM | POA: Insufficient documentation

## 2015-04-07 ENCOUNTER — Encounter: Payer: Self-pay | Admitting: Urology

## 2015-04-07 ENCOUNTER — Other Ambulatory Visit: Payer: Self-pay | Admitting: Urology

## 2015-04-07 ENCOUNTER — Telehealth: Payer: Self-pay | Admitting: Urology

## 2015-04-07 ENCOUNTER — Ambulatory Visit (INDEPENDENT_AMBULATORY_CARE_PROVIDER_SITE_OTHER): Payer: BC Managed Care – PPO | Admitting: Urology

## 2015-04-07 VITALS — BP 119/77 | HR 51 | Ht 70.0 in | Wt 223.3 lb

## 2015-04-07 DIAGNOSIS — R3129 Other microscopic hematuria: Secondary | ICD-10-CM

## 2015-04-07 DIAGNOSIS — N2889 Other specified disorders of kidney and ureter: Secondary | ICD-10-CM

## 2015-04-07 DIAGNOSIS — N200 Calculus of kidney: Secondary | ICD-10-CM | POA: Diagnosis not present

## 2015-04-07 LAB — URINALYSIS, COMPLETE
BILIRUBIN UA: NEGATIVE
GLUCOSE, UA: NEGATIVE
KETONES UA: NEGATIVE
LEUKOCYTES UA: NEGATIVE
NITRITE UA: NEGATIVE
Protein, UA: NEGATIVE
SPEC GRAV UA: 1.025 (ref 1.005–1.030)
Urobilinogen, Ur: 0.2 mg/dL (ref 0.2–1.0)
pH, UA: 6 (ref 5.0–7.5)

## 2015-04-07 LAB — MICROSCOPIC EXAMINATION: Bacteria, UA: NONE SEEN

## 2015-04-07 NOTE — Progress Notes (Signed)
8:42 AM   Melvin Hatfield [birth year] [id number]  Referring provider: Bobetta Lime, MD [address] [place], [place] [address]  Chief Complaint  Patient presents with  . Results    RUS  . Follow-up    HPI: Patient is a 45 year old Caucasian male who presents today to discuss his RUS results.  Previous history CT renal stone study completed on 10/02/2014 noted bilateral nephrolithiasis and a 9 mm indeterminate left renal mass.    Today, he is not feeling any discomfort. He is not having any urinary symptoms. He specifically denies gross hematuria, flank pain, fever, chills, nausea and vomiting.    RUS performed on 03/30/2015 noted the small exophytic left kidney lower pole lesion measures 7 mm in diameter. Because of its small size, this lesion is difficult to characterize, for example on image 56 and has similar echogenicity to the adjacent renal parenchyma, and is not definitely characterized as a simple cyst. The small size also makes it difficult to assess for enhance through transmission. The modality most likely to be definitive in assessing this type of small lesion but is renal protocol MRI with and without contrast. However, given the very small size and the statistically high likelihood that this is simply a tiny cyst, sonographic follow up to assess for change in 6 months time may be reasonable. Left nephrolithiasis. Prior tiny right kidney lower pole calculus seen on CT is not readily apparent today.  I have reviewed the films with the patient.       PMH: Past Medical History  Diagnosis Date  . Allergy   . Depression   . Sleep apnea   . GERD (gastroesophageal reflux disease)   . Hyperlipidemia   . Arthritis   . Left nephrolithiasis 08/17/2014  . Myocardial infarction ([place])   . 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 Coronary  angioplasty with stent placement    . Spine surgery    . Reconstructed ear drum    . Tympanostomy tube placement      patient states he has had 6    Home Medications:    Medication List       This list is accurate as of: 04/07/15  8:42 AM.  Always use your most recent med list.               amiodarone 200 MG tablet  Commonly known as:  PACERONE  Take 200 mg by mouth.     aspirin 81 MG chewable tablet  Chew 81 mg by mouth.     atorvastatin 20 MG tablet  Commonly known as:  LIPITOR  Take 20 mg by mouth.     BRILINTA 90 MG Tabs tablet  Generic drug:  ticagrelor  Take 90 mg by mouth.     omeprazole 20 MG capsule  Commonly known as:  PRILOSEC  Take 1 capsule (20 mg total) by mouth daily.     sertraline 100 MG tablet  Commonly known as:  ZOLOFT  Take 1 tablet (100 mg total) by mouth daily.     traZODone 100 MG tablet  Commonly known as:  DESYREL  Take 1 tablet (100 mg total) by mouth at bedtime as needed for sleep.        Allergies: No Known Allergies  Family History: Family History  Problem Relation Age of Onset  . Cancer Mother   .  Depression Mother   . Kidney disease Neg Hx   . Prostate cancer Neg Hx     Social History:  reports that he has never smoked. He does not have any smokeless tobacco history on file. He reports that he does not drink alcohol or use illicit drugs.  ROS: UROLOGY Frequent Urination?: No Hard to postpone urination?: No Burning/pain with urination?: No Get up at night to urinate?: No Leakage of urine?: No Urine stream starts and stops?: Yes Trouble starting stream?: No Do you have to strain to urinate?: No Blood in urine?: No Urinary tract infection?: No Sexually transmitted disease?: No Injury to kidneys or bladder?: No Painful intercourse?: No Weak stream?: No Erection problems?: No Penile pain?: No  Gastrointestinal Nausea?: No Vomiting?: No Indigestion/heartburn?: No Diarrhea?: No Constipation?:  No  Constitutional Fever: No Night sweats?: No Weight loss?: No Fatigue?: No  Skin Skin rash/lesions?: No Itching?: No  Eyes Blurred vision?: No Double vision?: No  Ears/Nose/Throat Sore throat?: No Sinus problems?: No  Hematologic/Lymphatic Swollen glands?: No Easy bruising?: No  Cardiovascular Leg swelling?: No Chest pain?: No  Respiratory Cough?: No Shortness of breath?: No  Endocrine Excessive thirst?: No  Musculoskeletal Back pain?: No Joint pain?: No  Neurological Headaches?: No Dizziness?: No  Psychologic Depression?: No Anxiety?: No  Physical Exam: Blood pressure 108/66, pulse 50, height '5\' 10"'  (1.778 m), weight 231 lb 6.4 oz (104.962 kg).  Laboratory Data:  Urinalysis    Component Value Date/Time   GLUCOSEU Negative 09/22/2014 1121   BILIRUBINUR Negative 09/22/2014 1121   NITRITE Negative 09/22/2014 1121   LEUKOCYTESUR Negative 09/22/2014 1121    Pertinent Imaging: CLINICAL DATA: Left renal mass seen on CT from 10/02/2014  EXAM: RENAL / URINARY TRACT ULTRASOUND COMPLETE  COMPARISON: 10/02/2014  FINDINGS: Right Kidney:  Length: 11.4 cm. Echogenicity within normal limits. No mass or hydronephrosis visualized. The tiny punctate right kidney lower pole calculus visible on prior CT scan is not readily apparent on today's ultrasound.  Left Kidney:  Length: 10.2 cm. Echogenicity within normal limits. Left kidney lower pole slightly exophytic hypoechoic lesion 0.7 cm on image 52. The small size of this lesion makes it difficult to characterize.  Clustered left kidney lower pole calculi, with the cluster measuring 1.0 by 1.1 by 0.3 cm. This is similar to the appearance shown on recent CT.  Bladder:  Appears normal for degree of bladder distention. Bilateral ureteral jets observed.  IMPRESSION: 1. The small exophytic left kidney lower pole lesion measures 7 mm in diameter. Because of its small size, this lesion  is difficult to characterize, for example on image 56 and has similar echogenicity to the adjacent renal parenchyma, and is not definitely characterized as a simple cyst. The small size also makes it difficult to assess for enhance through transmission. The modality most likely to be definitive in assessing this type of small lesion but is renal protocol MRI with and without contrast. However, given the very small size and the statistically high likelihood that this is simply a tiny cyst, sonographic follow up to assess for change in 6 months time may be reasonable. 2. Left nephrolithiasis. Prior tiny right kidney lower pole calculus seen on CT is not readily apparent today.   Electronically Signed  By: Van Clines M.D.  On: 03/30/2015 14:36  Assessment & Plan:    1. Bilateral nephrolithiasis:   Patient has a 2.8 mm right renal stone and a 6 mm left lower renal pole stone.  We discussed URS/LL/stent placement  vs ESWL vs continued observation as options for addressing his stones at his previous visit.  He has still not made his deductible.  He does not want any intervention concerning his stones at this time.  He did undergo a 24 hour urine study and has made dietary changes.  He will contact us or go to the ED if he experienced intractable pain or vomiting. He will also contact us or go to the ED if he should develop fevers, chills or gross hematuria.  2. Indeterminate left renal mass:    Patient has a 9 mm too small to characterize left lower renal pole cortical hypodense lesion on previous CT and it is still too small to characterize on the RUS.  I did offer MRI, but the patient has not met his deductible and does not want to incur a medical expense at this time.    I reminded the patient that renal masses less than 7 cm have a 20% of having a potentially aggressive histological features and that it would be reasonable to do follow up studies in 6 months.  We will schedule a RUS  in 6 months.  He will contact the office if he develops gross hematuria in the interim.    Return in about 6 months (around 10/08/2015) for RUS report and UA.  Zara Council, [facility] [address], [place], [place] [address] [phone]

## 2015-04-07 NOTE — Telephone Encounter (Signed)
done

## 2015-04-07 NOTE — Telephone Encounter (Signed)
Patient was found to have microscopic hematuria on his UA today.  His UA was not available at the time of his appointment, so I contacted him by phone.  He will need a CT Urogram report and cystoscopy scheduled with one of our doctors.  I have placed the orders for the CT Urogram.

## 2015-04-12 ENCOUNTER — Ambulatory Visit
Admission: RE | Admit: 2015-04-12 | Discharge: 2015-04-12 | Disposition: A | Discharge status: Home / Self Care | Payer: BC Managed Care – PPO | Source: Ambulatory Visit | Attending: Urology | Admitting: Urology

## 2015-04-12 ENCOUNTER — Ambulatory Visit: Payer: BC Managed Care – PPO

## 2015-04-12 DIAGNOSIS — R3129 Other microscopic hematuria: Secondary | ICD-10-CM | POA: Diagnosis present

## 2015-04-12 DIAGNOSIS — N281 Cyst of kidney, acquired: Secondary | ICD-10-CM | POA: Insufficient documentation

## 2015-04-12 DIAGNOSIS — N200 Calculus of kidney: Secondary | ICD-10-CM | POA: Insufficient documentation

## 2015-04-12 IMAGING — CT CT ABD-PEL WO/W CM
1 of 2 series · 13 of 32 positions shown, 18 images · IV contrast (APPLIED)
Comparison: Ultrasound [DATE] and CT scan [DATE]

CLINICAL DATA: History of microscopic hematuria and left lower lobe
renal lesion.

EXAM:
CT ABDOMEN AND PELVIS WITHOUT AND WITH CONTRAST
TECHNIQUE: Multidetector CT imaging of the abdomen and pelvis was performed
following the standard protocol before and following the bolus
administration of intravenous contrast.
CONTRAST:  150mL OMNIPAQUE IOHEXOL 350 MG/ML SOLN

[Series 7: axial delay · axial · delayed · 0.76mm/px · z∈[-921,-476]mm · 13 of 103 slices shown, 18 images]
[im 7/103  soft-tissue]
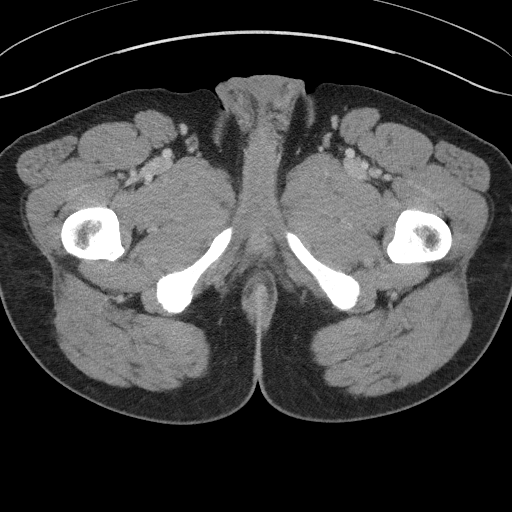
[im 7/103  bone]
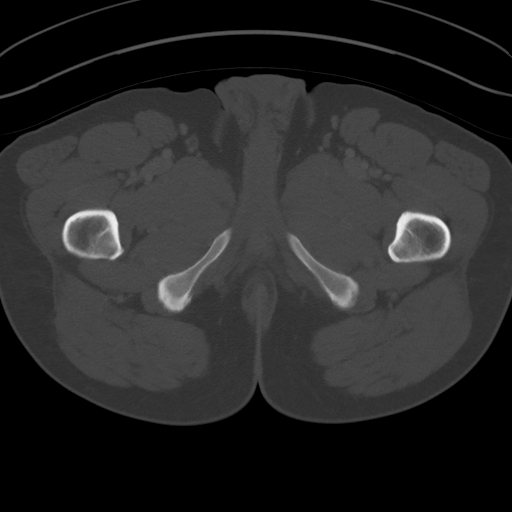
[im 14/103  soft-tissue]
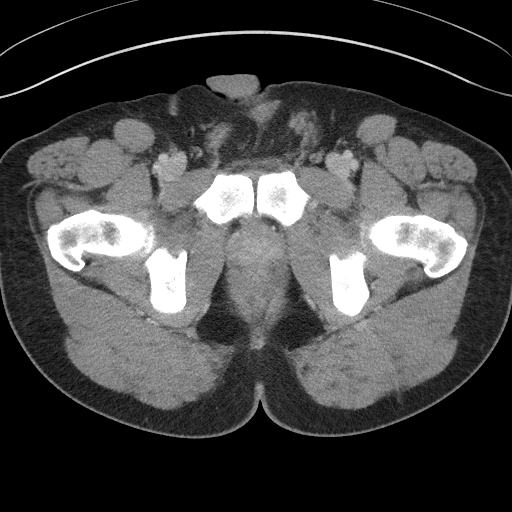
[im 21/103  soft-tissue]
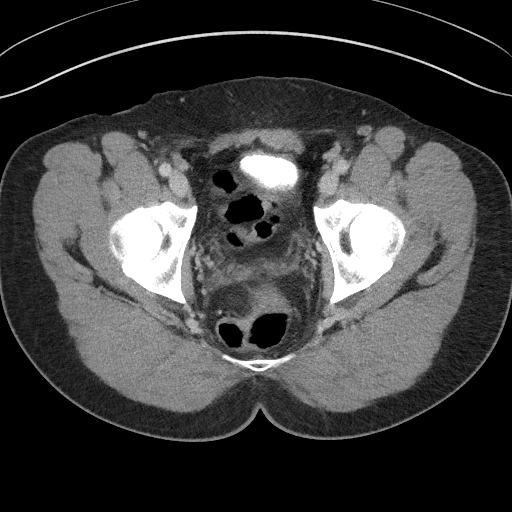
[im 35/103  soft-tissue]
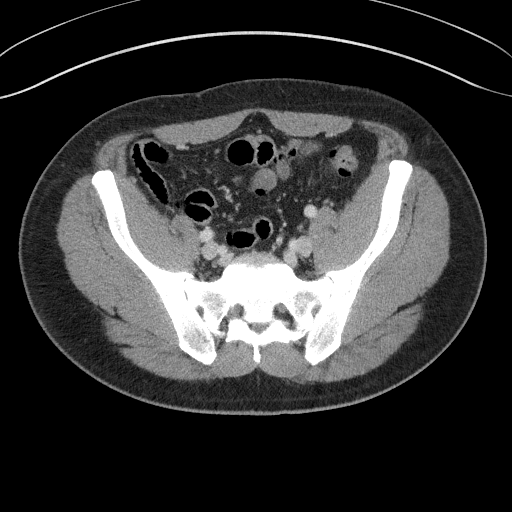
[im 41/103  soft-tissue]
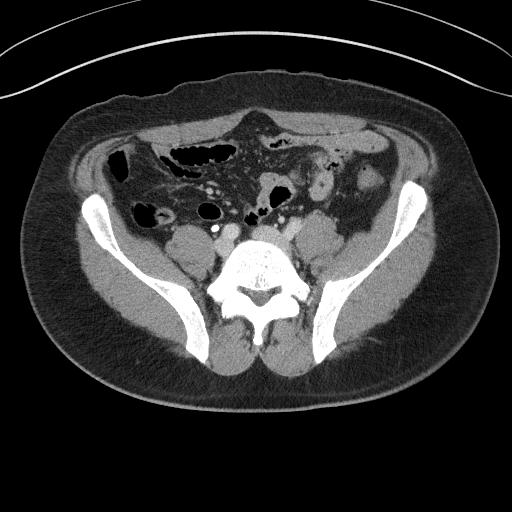
[im 48/103  soft-tissue]
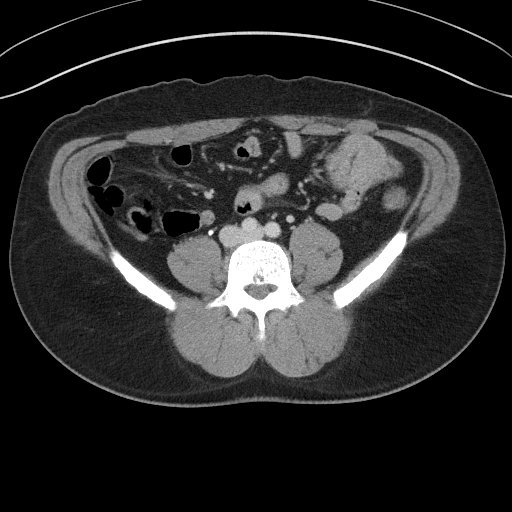
[im 55/103  soft-tissue]
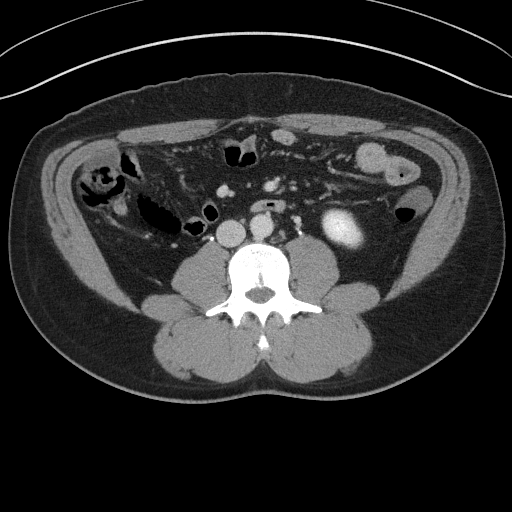
[im 62/103  soft-tissue]
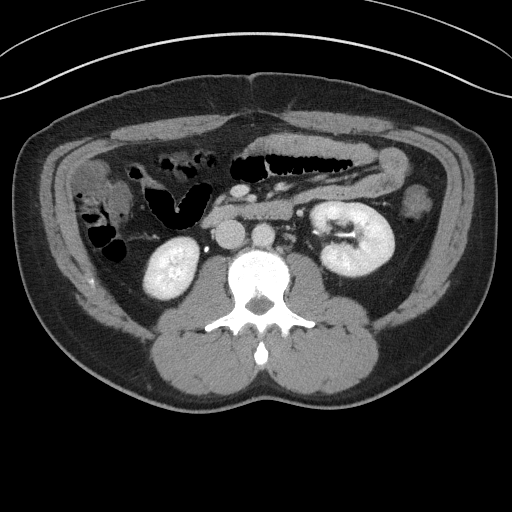
[im 69/103  soft-tissue]
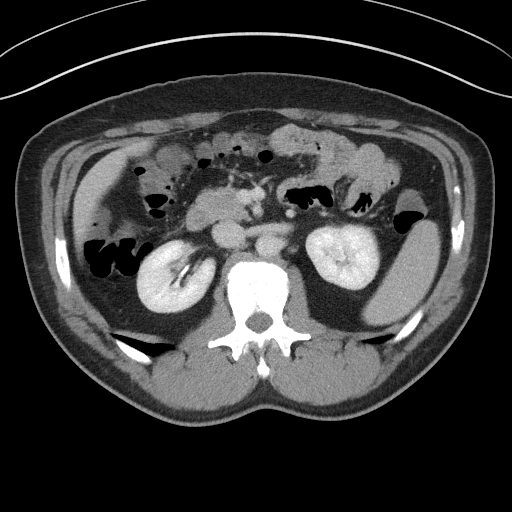
[im 69/103  bone]
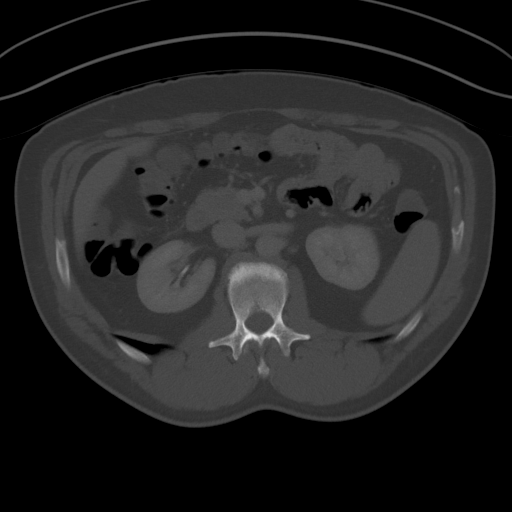
[im 75/103  lung]
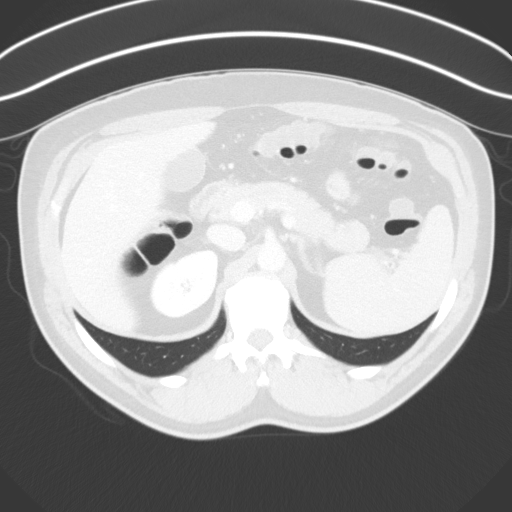
[im 82/103  soft-tissue]
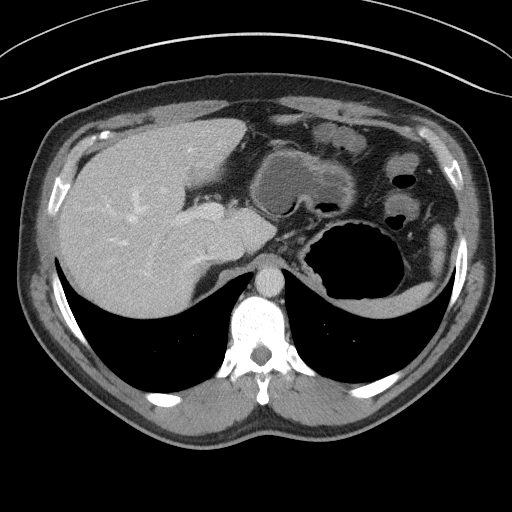
[im 82/103  lung]
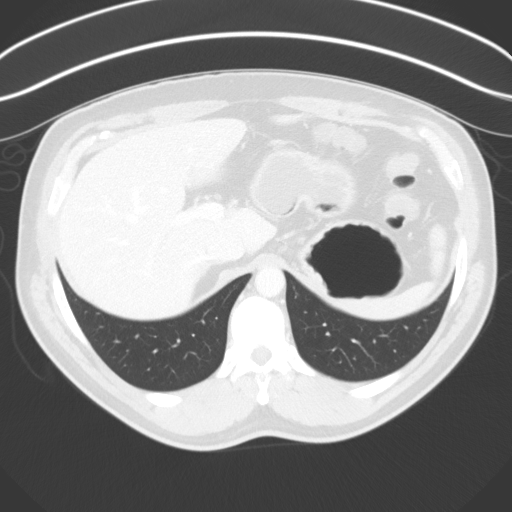
[im 89/103  soft-tissue]
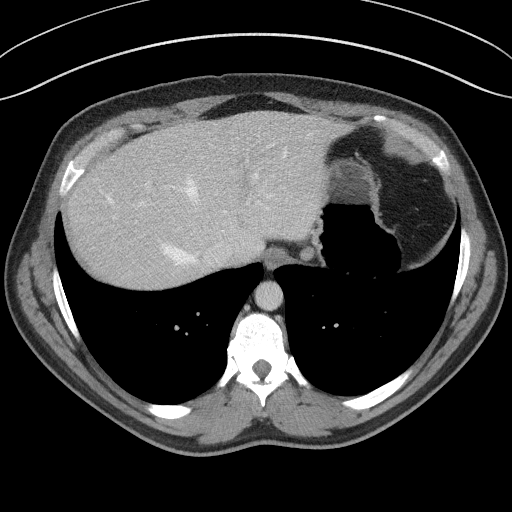
[im 89/103  lung]
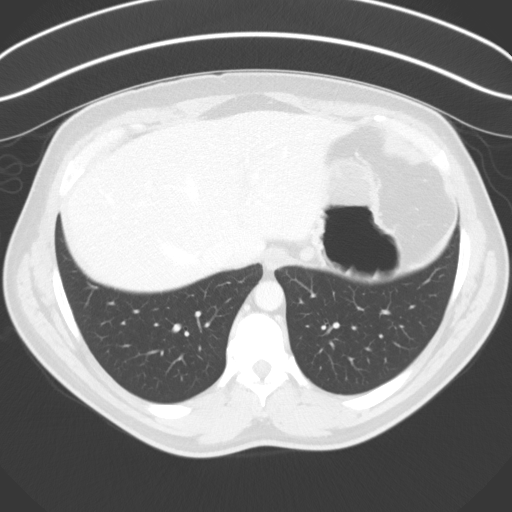
[im 96/103  soft-tissue]
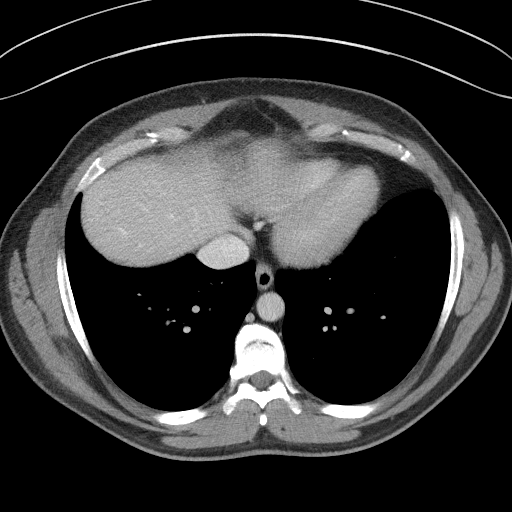
[im 96/103  lung]
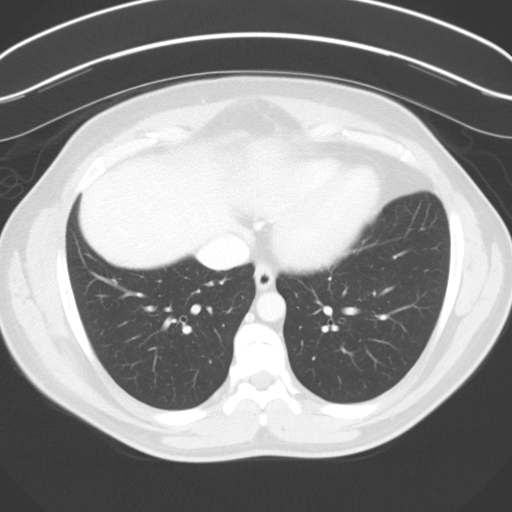

[13 of 32 positions shown; findings below may reference images not displayed]

FINDINGS: Lower chest: The lung bases are clear of acute process. No pleural
effusion or pulmonary lesions. The heart is normal in size. No
pericardial effusion. The distal esophagus and aorta are
unremarkable.

Hepatobiliary: Stable small low-attenuation liver lesion in segment
2 consistent with benign cysts. No worrisome hepatic lesions or
intrahepatic biliary dilatation. Gallbladder is normal. No common
bile duct dilatation.

Pancreas: No mass, inflammation or ductal dilatation.

Spleen: Normal size.  No focal lesions.

Adrenals/Urinary Tract: The adrenal glands are normal and stable.

There are bilateral lower pole renal calculi. This certainly could
account for patient's microhematuria. A few tiny left-sided mid pole
calculi noted also. No obstructing ureteral calculi or bladder
calculi.

Both kidneys demonstrate normal enhancement/ perfusion. There is a
small stable lower pole left renal cyst.

Stomach/Bowel: The stomach, duodenum, small bowel and colon are
grossly normal without oral contrast. No inflammatory changes, mass
lesions or obstructive findings. The terminal ileum and appendix are
normal.

Vascular/Lymphatic: The aorta is normal in caliber. No dissection.
The branch vessels are patent. The major venous structures are
patent. No mesenteric or retroperitoneal mass or adenopathy. Small
scattered lymph nodes are noted.

Reproductive: The bladder, prostate gland and seminal vesicles are
unremarkable.

Other: No ascites.  No abdominal wall hernia.  No inguinal hernia.

Musculoskeletal: No significant findings.
IMPRESSION: 1. Bilateral renal calculi likely accounting for the patient's
microhematuria. No obstructing ureteral calculi or bladder calculi.
2. Lower pole left renal cyst. No worrisome renal lesions and no
bladder abnormality.
3. No other significant abdominal/pelvic findings.

## 2015-04-12 MED ORDER — IOHEXOL 350 MG/ML SOLN
150.0000 mL | Freq: Once | INTRAVENOUS | Status: AC | PRN
  Administered 2015-04-12: 150 mL via INTRAVENOUS

## 2015-04-19 IMAGING — US US RENAL
1 series · 13 of 25 positions shown · non-contrast
Comparison: [DATE]

CLINICAL DATA: Left renal mass seen on CT from [DATE]

EXAM:
RENAL / URINARY TRACT ULTRASOUND COMPLETE

[Series 1: us renal · 0.25mm/px · 13 of 60 slices shown]
[im 1/60]
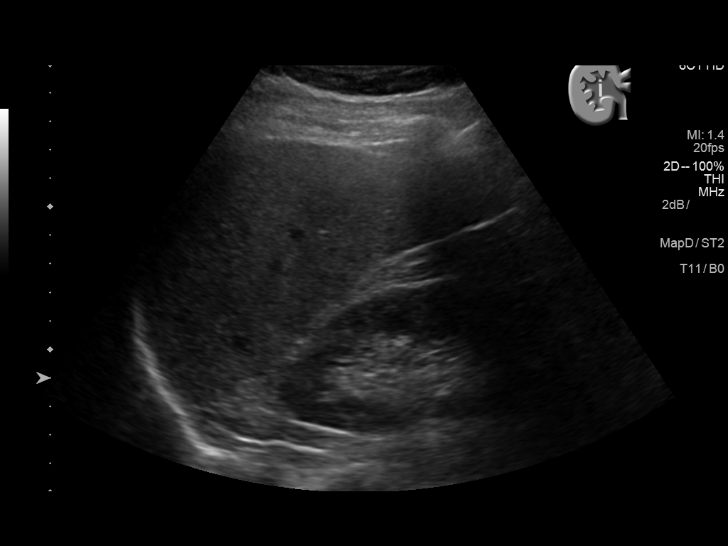
[im 5/60]
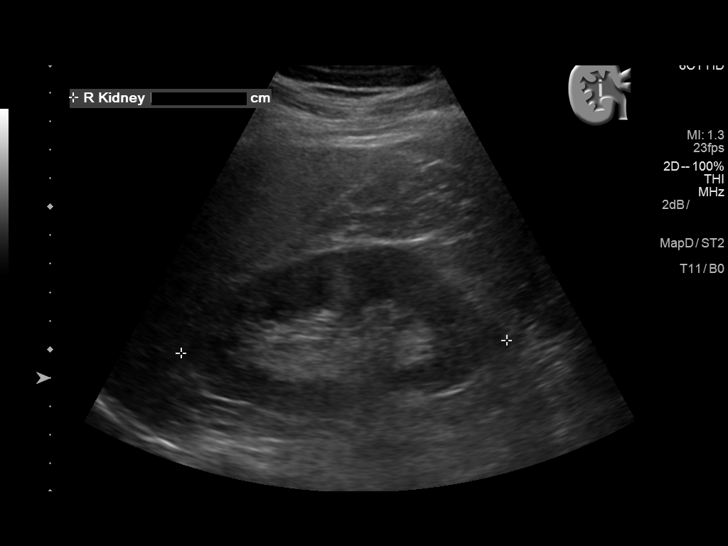
[im 10/60]
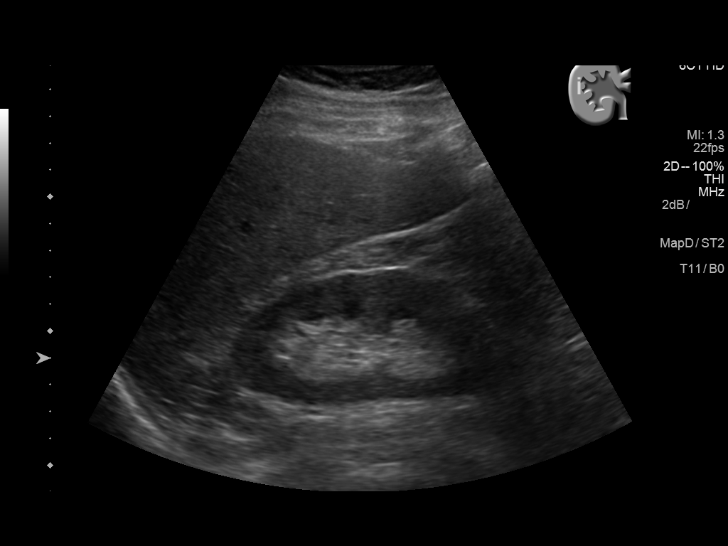
[im 15/60]
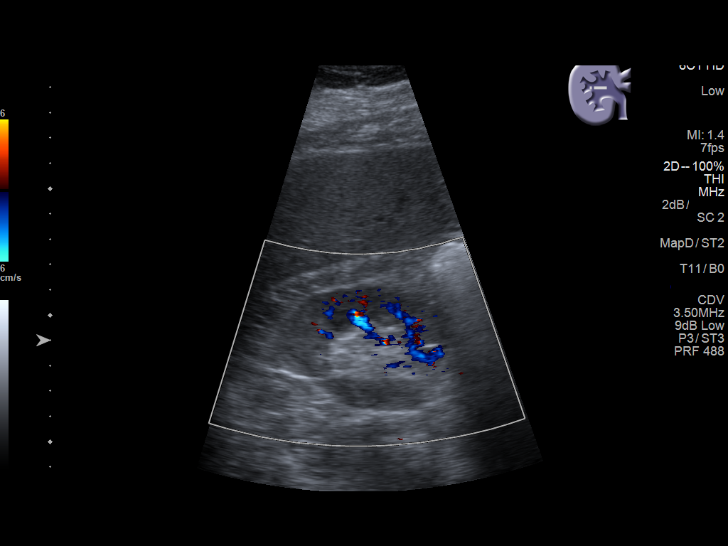
[im 20/60]
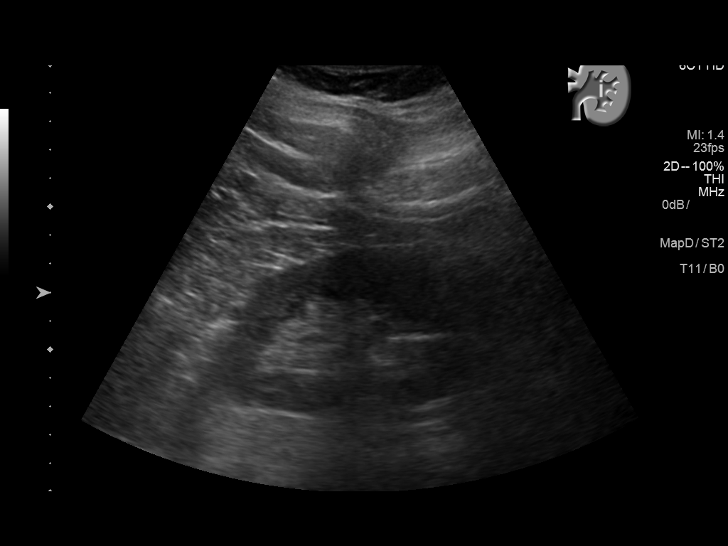
[im 25/60]
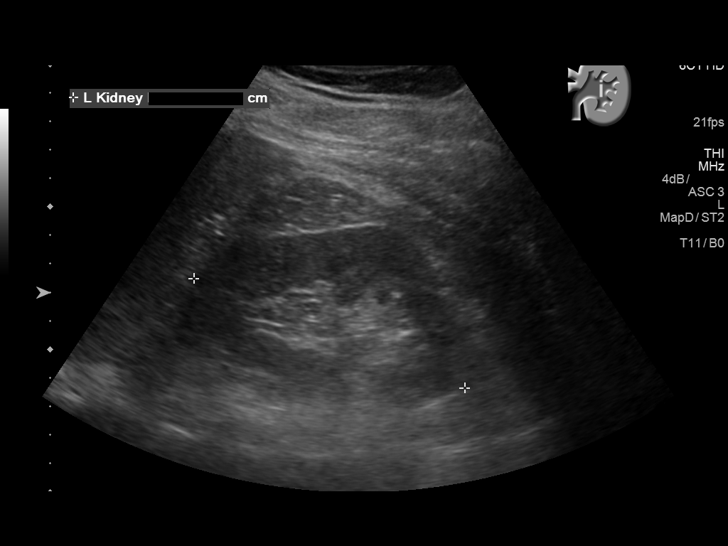
[im 30/60]
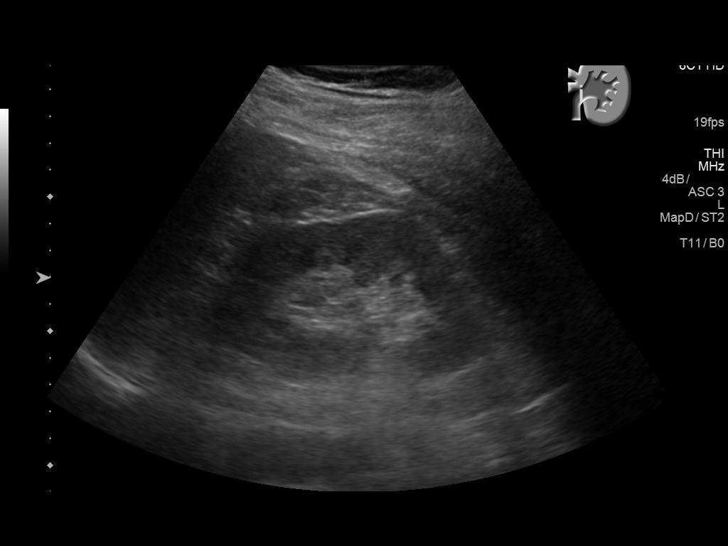
[im 35/60]
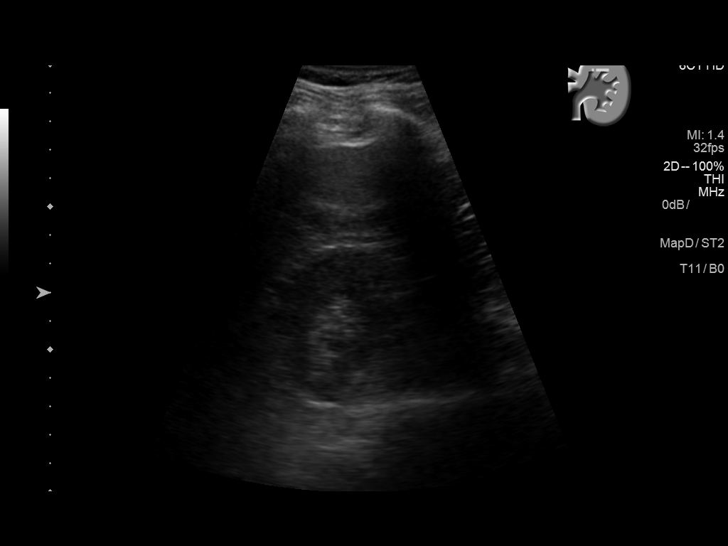
[im 40/60]
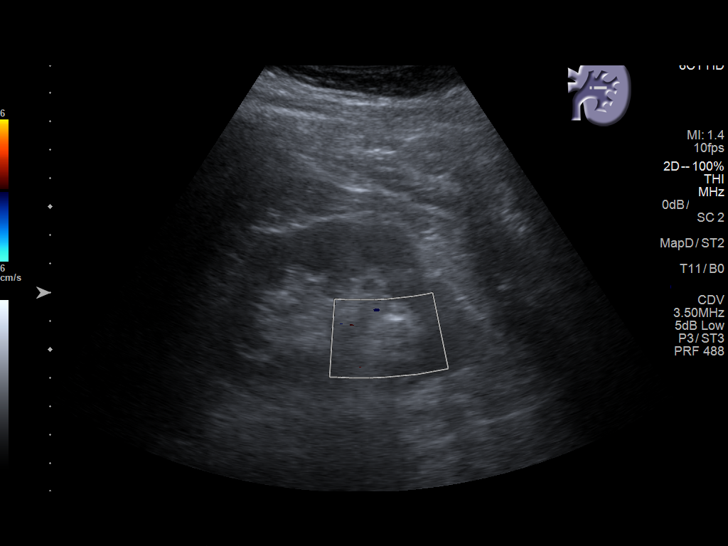
[im 45/60]
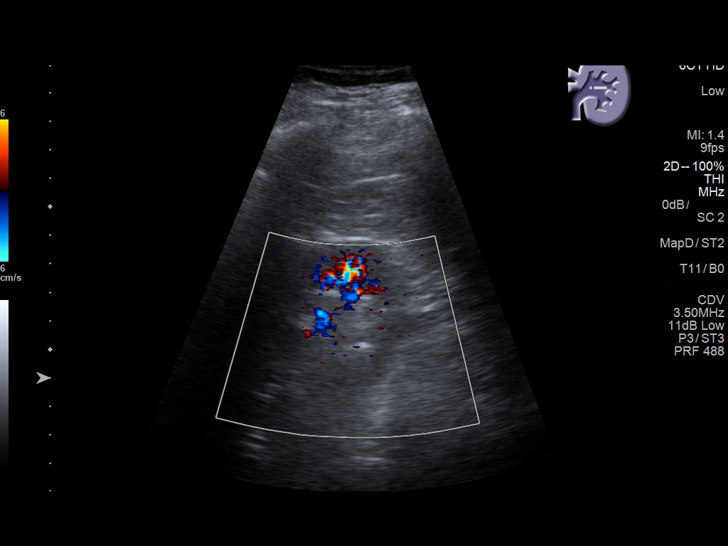
[im 50/60]
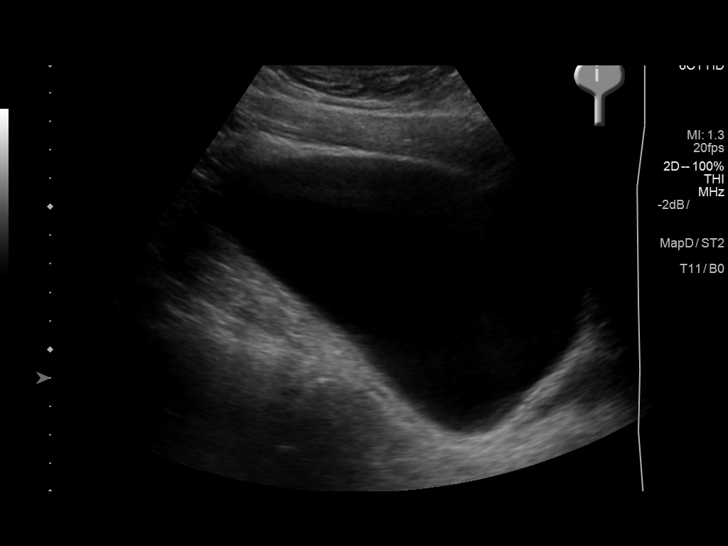
[im 55/60]
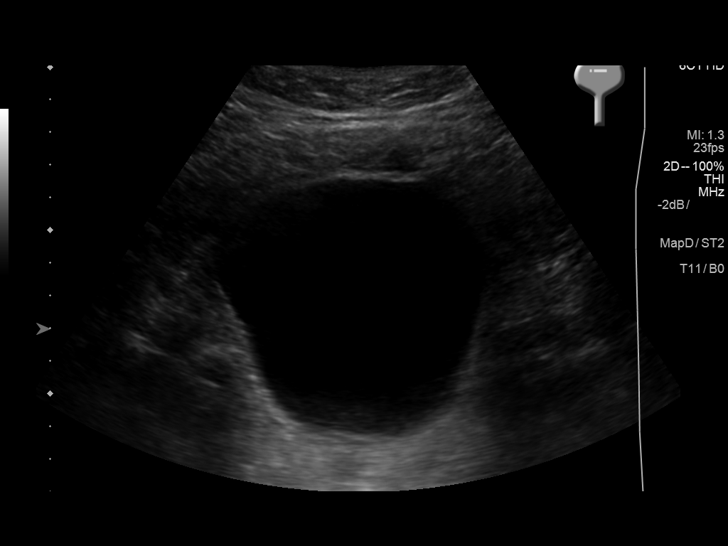
[im 60/60]
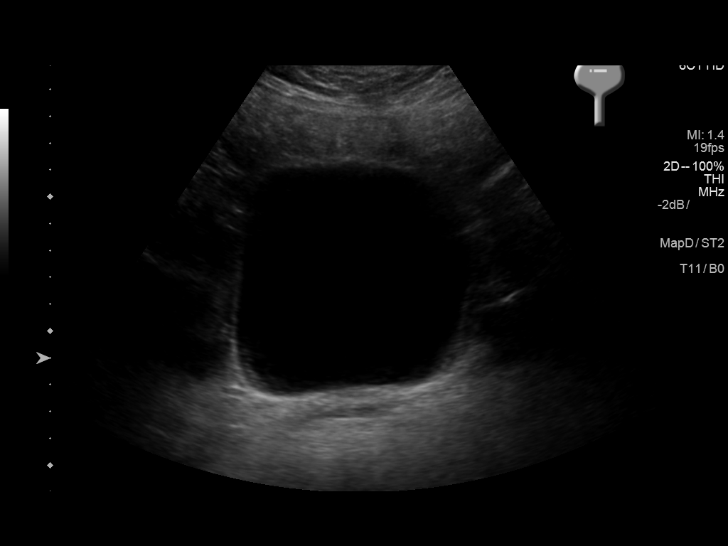

[13 of 25 positions shown; findings below may reference images not displayed]

FINDINGS: Right Kidney:

Length: 11.4 cm. Echogenicity within normal limits. No mass or
hydronephrosis visualized. The tiny punctate right kidney lower pole
calculus visible on prior CT scan is not readily apparent on today's
ultrasound.

Left Kidney:

Length: 10.2 cm. Echogenicity within normal limits. Left kidney
lower pole slightly exophytic hypoechoic lesion 0.7 cm on image 52.
The small size of this lesion makes it difficult to characterize.

Clustered left kidney lower pole calculi, with the cluster measuring
1.0 by 1.1 by 0.3 cm. This is similar to the appearance shown on
recent CT.

Bladder:

Appears normal for degree of bladder distention. Bilateral ureteral
jets observed.
IMPRESSION: 1. The small exophytic left kidney lower pole lesion measures 7 mm
in diameter. Because of its small size, this lesion is difficult to
characterize, for example on image 56 and has similar echogenicity
to the adjacent renal parenchyma, and is not definitely
characterized as a simple cyst. The small size also makes it
difficult to assess for enhance through transmission. The modality
most likely to be definitive in assessing this type of small lesion
but is renal protocol MRI with and without contrast. However, given
the very small size and the statistically high likelihood that this
is simply a tiny cyst, sonographic follow up to assess for change in
6 months time may be reasonable.
2. Left nephrolithiasis. Prior tiny right kidney lower pole calculus
seen on CT is not readily apparent today.

## 2015-04-23 ENCOUNTER — Encounter: Payer: Self-pay | Admitting: Urology

## 2015-04-23 ENCOUNTER — Ambulatory Visit (INDEPENDENT_AMBULATORY_CARE_PROVIDER_SITE_OTHER): Payer: BC Managed Care – PPO | Admitting: Urology

## 2015-04-23 VITALS — BP 141/77 | HR 49 | Ht 70.0 in | Wt 221.2 lb

## 2015-04-23 DIAGNOSIS — Q6100 Congenital renal cyst, unspecified: Secondary | ICD-10-CM

## 2015-04-23 DIAGNOSIS — N281 Cyst of kidney, acquired: Secondary | ICD-10-CM

## 2015-04-23 DIAGNOSIS — N200 Calculus of kidney: Secondary | ICD-10-CM | POA: Diagnosis not present

## 2015-04-23 DIAGNOSIS — Z125 Encounter for screening for malignant neoplasm of prostate: Secondary | ICD-10-CM

## 2015-04-23 DIAGNOSIS — R3129 Other microscopic hematuria: Secondary | ICD-10-CM | POA: Diagnosis not present

## 2015-04-23 LAB — MICROSCOPIC EXAMINATION

## 2015-04-23 LAB — URINALYSIS, COMPLETE
BILIRUBIN UA: NEGATIVE
GLUCOSE, UA: NEGATIVE
KETONES UA: NEGATIVE
Leukocytes, UA: NEGATIVE
NITRITE UA: NEGATIVE
PROTEIN UA: NEGATIVE
SPEC GRAV UA: 1.015 (ref 1.005–1.030)
UUROB: 0.2 mg/dL (ref 0.2–1.0)
pH, UA: 8.5 — ABNORMAL HIGH (ref 5.0–7.5)

## 2015-04-23 MED ORDER — LIDOCAINE HCL 2 % EX GEL
1.0000 "application " | Freq: Once | CUTANEOUS | Status: AC
  Administered 2015-04-23: 1 via URETHRAL

## 2015-04-23 MED ORDER — CIPROFLOXACIN HCL 500 MG PO TABS
500.0000 mg | ORAL_TABLET | Freq: Once | ORAL | Status: AC
  Administered 2015-04-23: 500 mg via ORAL

## 2015-04-23 NOTE — Progress Notes (Signed)
8:49 AM  04/23/15  Melvin Hatfield 03-21-1970 [id number]  Referring provider: Edwena Felty, MD No address on file  Chief Complaint  Patient presents with  . Cysto    HPI: 45 year old male with history of nephrolithiasis and back scope hematuria who presents today to complete his hematuria workup.  Microscopic hematuria Incidental and UA. No gross hematuria or flank pain.  CT urogram on 04/12/2015 reviewed personally. This reveals bilateral small nonobstructing calculi. He does also have a known left lower pole renal cyst which was previously incompletely characterized, but CT urogram shows definitively that this is not a worrisome or enhancing lesion rather benign cyst.  Left lower pole renal cyst  As above.   PMH: Past Medical History  Diagnosis Date  . Allergy   . Depression   . Sleep apnea   . GERD (gastroesophageal reflux disease)   . Hyperlipidemia   . Arthritis   . Left nephrolithiasis 08/17/2014  . Myocardial infarction (HCC)   . 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 Coronary angioplasty with stent placement    . Spine surgery    . Reconstructed ear drum    . Tympanostomy tube placement      patient states he has had 6    Home Medications:    Medication List       This list is accurate as of: 04/23/15 11:59 PM.  Always use your most recent med list.               amiodarone 200 MG tablet  Commonly known as:  PACERONE  Take 200 mg by mouth.     aspirin 81 MG chewable tablet  Chew 81 mg by mouth.     atorvastatin 20 MG tablet  Commonly known as:  LIPITOR  Take 20 mg by mouth.     BRILINTA 90 MG Tabs tablet  Generic drug:  ticagrelor  Take 90 mg by mouth.     omeprazole 20 MG capsule  Commonly known as:  PRILOSEC  Take 1 capsule (20 mg total) by mouth daily.     sertraline 100 MG tablet  Commonly known as:  ZOLOFT    Take 1 tablet (100 mg total) by mouth daily.     traZODone 100 MG tablet  Commonly known as:  DESYREL  Take 1 tablet (100 mg total) by mouth at bedtime as needed for sleep.        Allergies: No Known Allergies  Family History: Family History  Problem Relation Age of Onset  . Cancer Mother   . Depression Mother   . Kidney disease Neg Hx   . Prostate cancer Neg Hx     Social History:  reports that he has never smoked. He does not have any smokeless tobacco history on file. He reports that he does not drink alcohol or use illicit drugs.  Physical Exam: Blood pressure 108/66, pulse 50, height 5\' 10"  (1.778 m), weight 231 lb 6.4 oz (104.962 kg). NAD.  A&o x 3.  Abd soft, NT GU normal circumcised phallus, orthotopic meatus. Bilateral descended testicles.  Laboratory Data:  Urinalysis Results for orders placed or performed in visit on 04/23/15  Microscopic Examination  Result Value Ref Range   WBC, UA 0-5 0 -  5 /hpf   RBC, UA 3-10 (A) 0 -  2 /hpf   Epithelial Cells (non renal) 0-10 0 - 10 /  hpf   Mucus, UA Present (A) Not Estab.   Bacteria, UA Few (A) None seen/Few  Urinalysis, Complete  Result Value Ref Range   Specific Gravity, UA 1.015 1.005 - 1.030   pH, UA 8.5 (H) 5.0 - 7.5   Color, UA Yellow Yellow   Appearance Ur Clear Clear   Leukocytes, UA Negative Negative   Protein, UA Negative Negative/Trace   Glucose, UA Negative Negative   Ketones, UA Negative Negative   RBC, UA Trace (A) Negative   Bilirubin, UA Negative Negative   Urobilinogen, Ur 0.2 0.2 - 1.0 mg/dL   Nitrite, UA Negative Negative   Microscopic Examination See below:     Pertinent Imaging: Study Result     CLINICAL DATA: History of microscopic hematuria and left lower lobe renal lesion.  EXAM: CT ABDOMEN AND PELVIS WITHOUT AND WITH CONTRAST  TECHNIQUE: Multidetector CT imaging of the abdomen and pelvis was performed following the standard protocol before and following the  bolus administration of intravenous contrast.  CONTRAST: OMNIPAQUE IOHEXOL 350 MG/ML SOLN  COMPARISON: Ultrasound 03/30/2015 and CT scan 10/02/2014  FINDINGS: Lower chest: The lung bases are clear of acute process. No pleural effusion or pulmonary lesions. The heart is normal in size. No pericardial effusion. The distal esophagus and aorta are unremarkable.  Hepatobiliary: Stable small low-attenuation liver lesion in segment 2 consistent with benign cysts. No worrisome hepatic lesions or intrahepatic biliary dilatation. Gallbladder is normal. No common bile duct dilatation.  Pancreas: No mass, inflammation or ductal dilatation.  Spleen: Normal size. No focal lesions.  Adrenals/Urinary Tract: The adrenal glands are normal and stable.  There are bilateral lower pole renal calculi. This certainly could account for patient's microhematuria. A few tiny left-sided mid pole calculi noted also. No obstructing ureteral calculi or bladder calculi.  Both kidneys demonstrate normal enhancement/ perfusion. There is a small stable lower pole left renal cyst.  Stomach/Bowel: The stomach, duodenum, small bowel and colon are grossly normal without oral contrast. No inflammatory changes, mass lesions or obstructive findings. The terminal ileum and appendix are normal.  Vascular/Lymphatic: The aorta is normal in caliber. No dissection. The branch vessels are patent. The major venous structures are patent. No mesenteric or retroperitoneal mass or adenopathy. Small scattered lymph nodes are noted.  Reproductive: The bladder, prostate gland and seminal vesicles are unremarkable.  Other: No ascites. No abdominal wall hernia. No inguinal hernia.  Musculoskeletal: No significant findings.  IMPRESSION: 1. Bilateral renal calculi likely accounting for the patient's microhematuria. No obstructing ureteral calculi or bladder calculi. 2. Lower pole left renal cyst. No  worrisome renal lesions and no bladder abnormality. 3. No other significant abdominal/pelvic findings.   Electronically Signed  By: Rudie Meyer M.D.  On: 04/12/2015 16:06       Cystoscopy Procedure Note  Patient identification was confirmed, informed consent was obtained, and patient was prepped using Betadine solution.  Lidocaine jelly was administered per urethral meatus.    Preoperative abx where received prior to procedure.     Pre-Procedure: - Inspection reveals a normal caliber ureteral meatus.  Procedure: The flexible cystoscope was introduced without difficulty - No urethral strictures/lesions are present. - Enlarged prostate (4.5 cm length) - Elevated bladder neck (very mild) - Bilateral ureteral orifices identified - Bladder mucosa  reveals no ulcers, tumors, or lesions - No bladder stones - Mild trabeculation  Retroflexion shows Slight mass effect of the prostate to the bladder, subtle   Post-Procedure: - Patient tolerated the procedure well  Assessment & Plan:    1. Microscopic hematuria Status post CT urogram and cystoscopy Slightly enlarged prostate noted with mild trabeculation of bladder, otherwise asymptomatic CT urogram as above - Urinalysis, Complete - ciprofloxacin (CIPRO) tablet 500 mg; Take 1 tablet (500 mg total) by mouth once. - lidocaine (XYLOCAINE) 2 % jelly 1 application; Place 1 application into the urethra once. - DG Abd 1 View; Future  2. Nephrolithiasis Bilateral nonobstructing calculi. Patient is not interested in intervention at this time. Plan follow-up in 6 months KUB.  3. Renal cyst, left The patient be consistent with benign cysts. No further workup  or surveillance recommended.  4. Screening PSA (prostate specific antigen) Patient expressed his interested in initiating PSA screening. Family history unknown. He would like to begin surveillance, we can draw PSA at the next appointment.   Return in about 6  months (around 10/23/2015) for KUB (shannon).  Vanna Scotland, MD  [facility] [address] [place], [place] [address] [phone]

## 2015-04-26 ENCOUNTER — Encounter: Payer: Self-pay | Admitting: Urology

## 2015-05-20 HISTORY — PX: CORONARY ANGIOPLASTY WITH STENT PLACEMENT: SHX49

## 2015-09-09 DIAGNOSIS — R42 Dizziness and giddiness: Secondary | ICD-10-CM | POA: Insufficient documentation

## 2015-10-01 ENCOUNTER — Other Ambulatory Visit: Payer: Self-pay

## 2015-10-01 DIAGNOSIS — Z125 Encounter for screening for malignant neoplasm of prostate: Secondary | ICD-10-CM

## 2015-10-06 ENCOUNTER — Other Ambulatory Visit: Payer: BC Managed Care – PPO

## 2015-10-06 DIAGNOSIS — Z125 Encounter for screening for malignant neoplasm of prostate: Secondary | ICD-10-CM

## 2015-10-07 LAB — PSA: PROSTATE SPECIFIC AG, SERUM: 0.7 ng/mL (ref 0.0–4.0)

## 2015-10-08 ENCOUNTER — Ambulatory Visit
Admission: RE | Admit: 2015-10-08 | Discharge: 2015-10-08 | Disposition: A | Discharge status: Home / Self Care | Payer: BC Managed Care – PPO | Source: Ambulatory Visit | Attending: Urology | Admitting: Urology

## 2015-10-08 ENCOUNTER — Ambulatory Visit (INDEPENDENT_AMBULATORY_CARE_PROVIDER_SITE_OTHER): Payer: BC Managed Care – PPO | Admitting: Urology

## 2015-10-08 VITALS — BP 129/82 | HR 54 | Ht 70.0 in | Wt 236.2 lb

## 2015-10-08 DIAGNOSIS — R3129 Other microscopic hematuria: Secondary | ICD-10-CM

## 2015-10-08 DIAGNOSIS — N200 Calculus of kidney: Secondary | ICD-10-CM

## 2015-10-08 DIAGNOSIS — N138 Other obstructive and reflux uropathy: Secondary | ICD-10-CM

## 2015-10-08 DIAGNOSIS — N401 Enlarged prostate with lower urinary tract symptoms: Secondary | ICD-10-CM

## 2015-10-08 DIAGNOSIS — I878 Other specified disorders of veins: Secondary | ICD-10-CM | POA: Insufficient documentation

## 2015-10-08 LAB — URINALYSIS, COMPLETE
BILIRUBIN UA: NEGATIVE
GLUCOSE, UA: NEGATIVE
Ketones, UA: NEGATIVE
LEUKOCYTES UA: NEGATIVE
NITRITE UA: NEGATIVE
Protein, UA: NEGATIVE
SPEC GRAV UA: 1.02 (ref 1.005–1.030)
Urobilinogen, Ur: 0.2 mg/dL (ref 0.2–1.0)
pH, UA: 6 (ref 5.0–7.5)

## 2015-10-08 LAB — MICROSCOPIC EXAMINATION
BACTERIA UA: NONE SEEN
EPITHELIAL CELLS (NON RENAL): NONE SEEN /HPF (ref 0–10)

## 2015-10-08 LAB — BLADDER SCAN AMB NON-IMAGING: SCAN RESULT: 54

## 2015-10-08 NOTE — Progress Notes (Signed)
9:44 AM   Domingo Madeira Aug 25, 1970 [id number]  Referring provider: Edwena Felty, MD [address] [place], [place] [address]  Chief Complaint  Patient presents with  . Nephrolithiasis    follow up    HPI: Patient is a 45 year old Caucasian male who presents today for a 6 month follow up for BPH and LUTS, nephrolithiasis and a history of hematuria.    BPH WITH LUTS His IPSS score today is 11, which is moderate lower urinary tract symptomatology. He is mixed with his quality life due to his urinary symptoms. His PVR is 54 mL.  His major complaint today is urinary frequency, urgency and intermittency.  He has had these symptoms for over one year.  He denies any dysuria, hematuria or suprapubic pain.   He also denies any recent fevers, chills, nausea or vomiting.  He does not have a family history of PCa.      IPSS    Row Name 10/08/15 0800         International Prostate Symptom Score   How often have you had the sensation of not emptying your bladder? Less than 1 in 5     How often have you had to urinate less than every two hours? About half the time     How often have you found you stopped and started again several times when you urinated? More than half the time     How often have you found it difficult to postpone urination? Less than half the time     How often have you had a weak urinary stream? Less than 1 in 5 times     How often have you had to strain to start urination? Not at All     How many times did you typically get up at night to urinate? None     Total IPSS Score 11       Quality of Life due to urinary symptoms   If you were to spend the rest of your life with your urinary condition just the way it is now how would you feel about that? Mixed        Score:  1-7 Mild 8-19 Moderate 20-35 Severe  Bilateral nephrolithiasis Patient's KUB taken today noted a left lower pole nephrolithiasis. The previous punctate right lower pole stone was unable  to be visualized.  He has not had any flank pain or gross hematuria.  History of hematuria Incidental on UA. No gross hematuria or flank pain.  CT urogram on 04/12/2015 reviewed personally. This reveals bilateral small nonobstructing calculi. He does also have a known left lower pole renal cyst which was previously incompletely characterized, but CT urogram shows definitively that this is not a worrisome or enhancing lesion rather benign cyst.  Cystoscopy performed on 04/23/2015 with Dr. Apolinar Junes noted mild trabeculation of the bladder with a slightly enlarged prostate.   UA today is positive for 3-10 RBCs.   PMH: Past Medical History:  Diagnosis Date  . Allergy   . Arthritis   . Coronary artery disease involving native coronary artery without angina pectoris   . Depression   . GERD (gastroesophageal reflux disease)   . Hyperlipidemia   . Left nephrolithiasis 08/17/2014  . Myocardial infarction (HCC)   . Sleep apnea     Surgical History: Past Surgical History:  Procedure Laterality Date  . CORONARY ANGIOPLASTY WITH STENT PLACEMENT    . reconstructed ear drum    . SPINE SURGERY    .  TYMPANOSTOMY TUBE PLACEMENT     patient states he has had 6    Home Medications:    Medication List       Accurate as of 10/08/15  9:44 AM. Always use your most recent med list.          amiodarone 200 MG tablet Commonly known as:  PACERONE Take 200 mg by mouth.   atorvastatin 20 MG tablet Commonly known as:  LIPITOR Take 20 mg by mouth.   BRILINTA 90 MG Tabs tablet Generic drug:  ticagrelor Take 90 mg by mouth.   omeprazole 20 MG capsule Commonly known as:  PRILOSEC Take 1 capsule (20 mg total) by mouth daily.   sertraline 100 MG tablet Commonly known as:  ZOLOFT Take 1 tablet (100 mg total) by mouth daily.   traZODone 100 MG tablet Commonly known as:  DESYREL Take 1 tablet (100 mg total) by mouth at bedtime as needed for sleep.       Allergies: No Known Allergies  Family  History: Family History  Problem Relation Age of Onset  . Cancer Mother   . Depression Mother   . Kidney disease Neg Hx   . Prostate cancer Neg Hx     Social History:  reports that he has never smoked. He does not have any smokeless tobacco history on file. He reports that he does not drink alcohol or use drugs.  ROS: UROLOGY Frequent Urination?: No Hard to postpone urination?: No Burning/pain with urination?: No Get up at night to urinate?: No Leakage of urine?: No Urine stream starts and stops?: Yes Trouble starting stream?: No Do you have to strain to urinate?: No Blood in urine?: No Urinary tract infection?: No Sexually transmitted disease?: No Injury to kidneys or bladder?: No Painful intercourse?: No Weak stream?: No Erection problems?: No Penile pain?: No  Gastrointestinal Nausea?: No Vomiting?: No Indigestion/heartburn?: No Diarrhea?: No Constipation?: No  Constitutional Fever: No Night sweats?: No Weight loss?: No Fatigue?: No  Skin Skin rash/lesions?: No Itching?: No  Eyes Blurred vision?: No Double vision?: No  Ears/Nose/Throat Sore throat?: No Sinus problems?: No  Hematologic/Lymphatic Swollen glands?: No Easy bruising?: No  Cardiovascular Leg swelling?: No Chest pain?: No  Respiratory Cough?: No Shortness of breath?: No  Endocrine Excessive thirst?: No  Musculoskeletal Back pain?: No Joint pain?: No  Neurological Headaches?: No Dizziness?: No  Psychologic Depression?: No Anxiety?: No  Physical Exam: Blood pressure 129/82, pulse (!) 54, height 5\' 10"  (1.778 m), weight 236 lb 3.2 oz (107.1 kg). Constitutional: Well nourished. Alert and oriented, No acute distress. HEENT: [place] AT, moist mucus membranes. Trachea midline, no masses. Cardiovascular: No clubbing, cyanosis, or edema. Respiratory: Normal respiratory effort, no increased work of breathing. GI: Abdomen is soft, non tender, non distended, no abdominal masses.  Liver and spleen not palpable.  No hernias appreciated.  Stool sample for occult testing is not indicated.   GU: No CVA tenderness.  No bladder fullness or masses.  Patient with circumcised phallus.  Urethral meatus is patent.  No penile discharge. No penile lesions or rashes. Scrotum without lesions, cysts, rashes and/or edema.  Testicles are located scrotally bilaterally. No masses are appreciated in the testicles. Left and right epididymis are normal. Rectal: Patient with  normal sphincter tone. Anus and perineum without scarring or rashes. No rectal masses are appreciated. Prostate is approximately 55 grams, no nodules are appreciated. Seminal vesicles are normal. Skin: No rashes, bruises or suspicious lesions. Lymph: No cervical or inguinal adenopathy. Neurologic:  Grossly intact, no focal deficits, moving all 4 extremities. Psychiatric: Normal mood and affect.   Laboratory Data: PSA 0.7 ng/mL on 10/07/2015  Urinalysis  Significant for 3-10RBC's/hpf.  See EPIC.  Pertinent Imaging: CLINICAL DATA: Left renal mass seen on CT from 10/02/2014  EXAM: RENAL / URINARY TRACT ULTRASOUND COMPLETE  COMPARISON: 10/02/2014  FINDINGS: Right Kidney:  Length: 11.4 cm. Echogenicity within normal limits. No mass or hydronephrosis visualized. The tiny punctate right kidney lower pole calculus visible on prior CT scan is not readily apparent on today's ultrasound.  Left Kidney:  Length: 10.2 cm. Echogenicity within normal limits. Left kidney lower pole slightly exophytic hypoechoic lesion 0.7 cm on image 52. The small size of this lesion makes it difficult to characterize.  Clustered left kidney lower pole calculi, with the cluster measuring 1.0 by 1.1 by 0.3 cm. This is similar to the appearance shown on recent CT.  Bladder:  Appears normal for degree of bladder distention. Bilateral ureteral jets observed.  IMPRESSION: 1. The small exophytic left kidney lower pole lesion  measures 7 mm in diameter. Because of its small size, this lesion is difficult to characterize, for example on image 56 and has similar echogenicity to the adjacent renal parenchyma, and is not definitely characterized as a simple cyst. The small size also makes it difficult to assess for enhance through transmission. The modality most likely to be definitive in assessing this type of small lesion but is renal protocol MRI with and without contrast. However, given the very small size and the statistically high likelihood that this is simply a tiny cyst, sonographic follow up to assess for change in 6 months time may be reasonable. 2. Left nephrolithiasis. Prior tiny right kidney lower pole calculus seen on CT is not readily apparent today.   Electronically Signed  By: Gaylyn Rong M.D.  On: 03/30/2015 14:36  Assessment & Plan:    1. BPH with LUTS  - IPSS score is 11/3  - Continue conservative management, avoiding bladder irritants and timed voiding's  - RTC in 12 months for IPSS, PSA and exam   2. Microscopic hematuria  - UA with 3-10RBC's/hpf  - completed a hematuria work up in 03/2015  - RTC in one year for UA  - will report any gross hematuria  3. Bilateral nephrolithiasis  - RTC in one year for KUB  - not interested in intervention at this time  - Advised to contact our office or seek treatment in the ED if becomes febrile or pain/ vomiting are difficult control in order to arrange for emergent/urgent intervention  Return in about 1 year (around 10/07/2016) for KUB, UA, PSA, IPSS and exam.  Michiel Cowboy, [facility]  [facility] [address], [address] [place], [place] [address] [phone]

## 2015-10-13 ENCOUNTER — Encounter: Payer: Self-pay | Admitting: Urology

## 2015-10-28 IMAGING — CR DG ABDOMEN 1V
1 series · 1 of 1 positions shown · non-contrast
Comparison: [DATE]

CLINICAL DATA: Microscopic hematuria.

EXAM:
ABDOMEN - 1 VIEW

[dg abd 1 view]
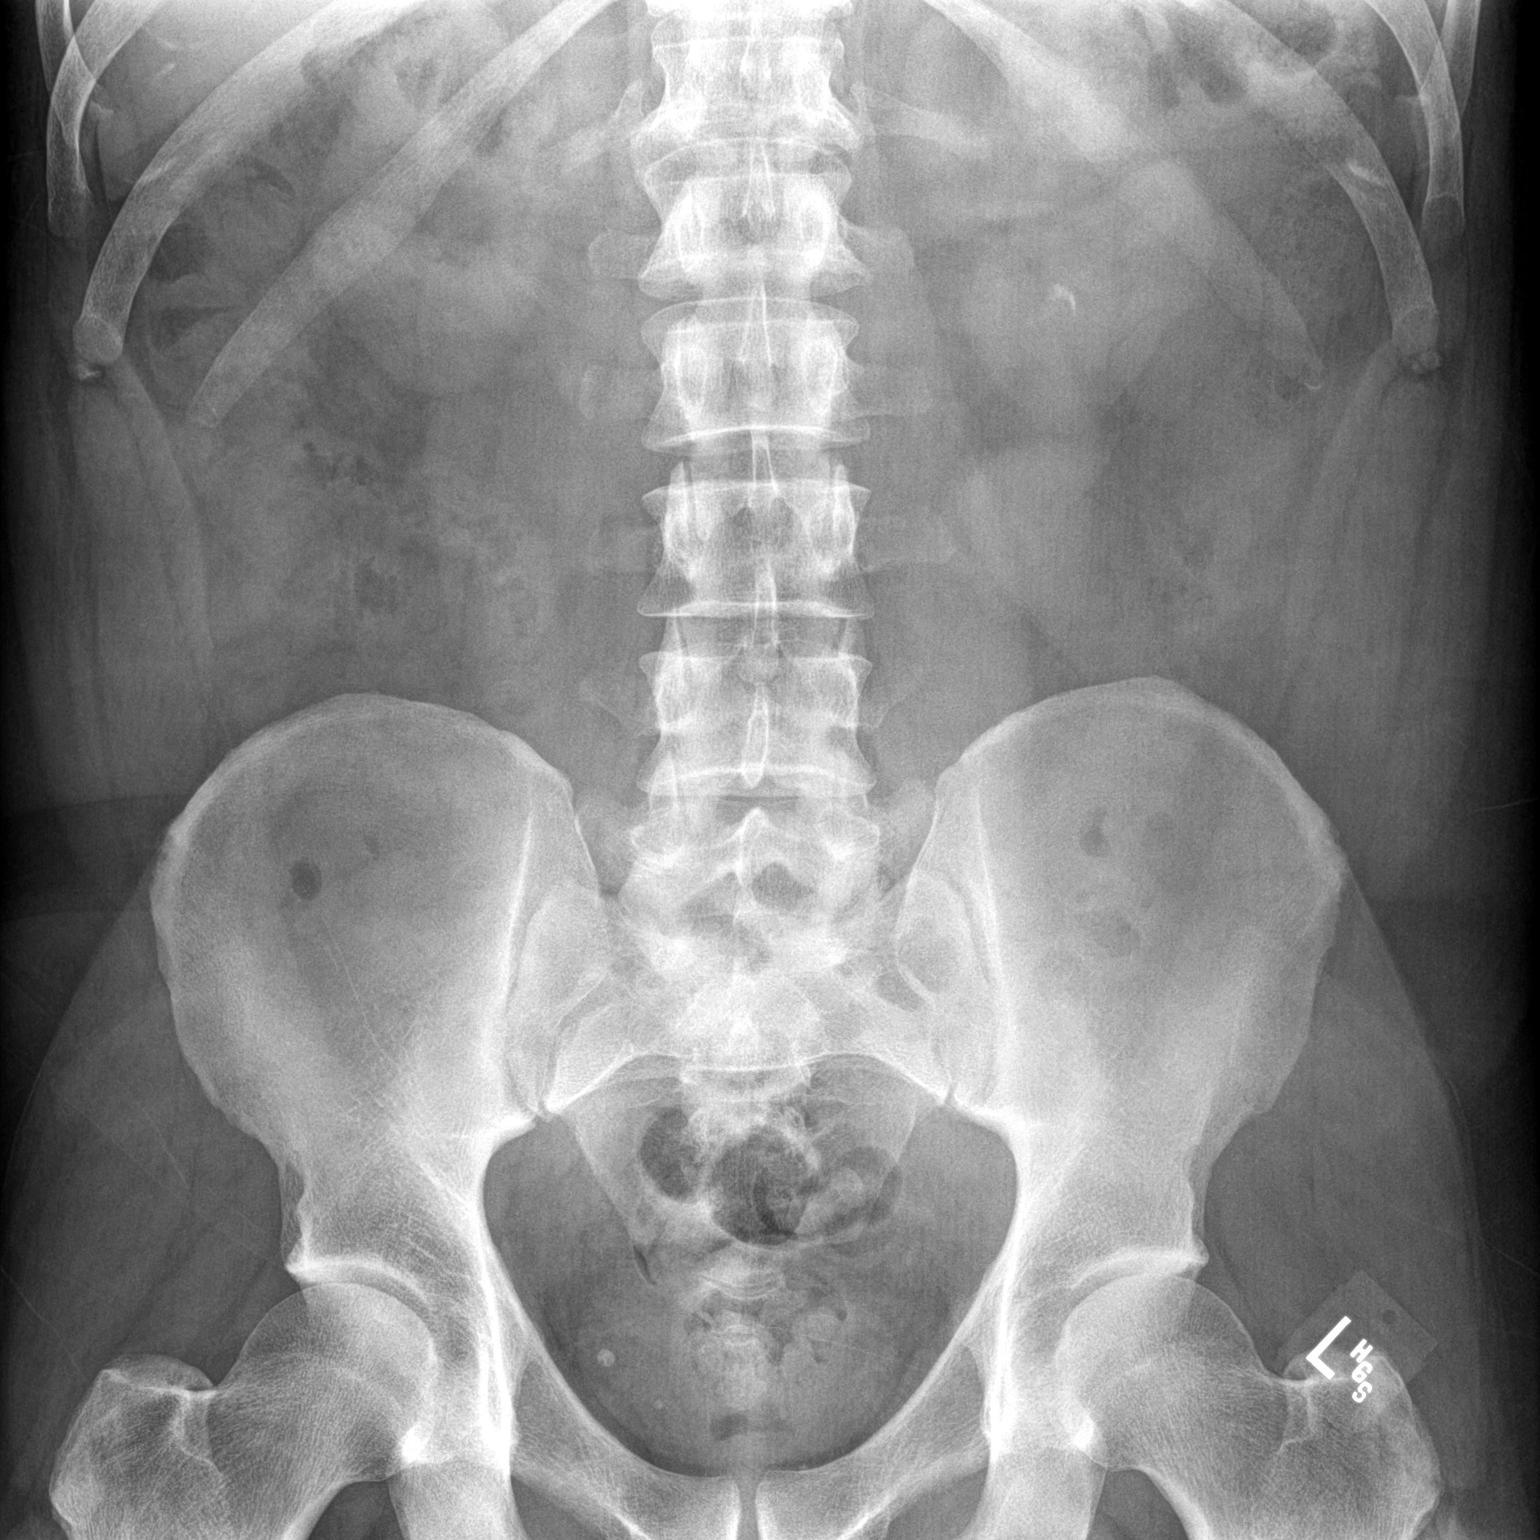

[1 of 1 positions shown; findings below may reference images not displayed]

FINDINGS: Stable left lower pole renal stone. Calcified phlebolith in the
right pelvis. No visible calcification over the right kidney. The
previously seen punctate right lower pole renal stone likely not
visible by plain film. No organomegaly. Normal bowel gas pattern.
IMPRESSION: Left lower pole nephrolithiasis. Unable to visualize the previously
seen punctate right lower pole stone.

## 2015-12-28 ENCOUNTER — Telehealth: Payer: Self-pay

## 2015-12-28 DIAGNOSIS — F3342 Major depressive disorder, recurrent, in full remission: Secondary | ICD-10-CM

## 2015-12-28 MED ORDER — SERTRALINE HCL 100 MG PO TABS: 100.0000 mg | ORAL_TABLET | Freq: Every day | ORAL | 0 refills | Status: DC

## 2015-12-28 NOTE — Telephone Encounter (Signed)
Please call patient; ask him to schedule an appointment I have never met him and his last visit was almost a year ago Please invite him for an office visit I've sent a month of sertraline so he doesn't run out Thank you

## 2015-12-28 NOTE — Telephone Encounter (Signed)
Patient informed and scheduled appointment for 01/20/16

## 2016-01-20 ENCOUNTER — Encounter: Payer: Self-pay | Admitting: Family Medicine

## 2016-01-20 ENCOUNTER — Ambulatory Visit (INDEPENDENT_AMBULATORY_CARE_PROVIDER_SITE_OTHER): Payer: BC Managed Care – PPO | Admitting: Family Medicine

## 2016-01-20 VITALS — BP 130/80 | HR 50 | Temp 97.9°F | Resp 16 | Wt 245.3 lb

## 2016-01-20 DIAGNOSIS — F3341 Major depressive disorder, recurrent, in partial remission: Secondary | ICD-10-CM | POA: Diagnosis not present

## 2016-01-20 DIAGNOSIS — F3342 Major depressive disorder, recurrent, in full remission: Secondary | ICD-10-CM | POA: Diagnosis not present

## 2016-01-20 DIAGNOSIS — I2510 Atherosclerotic heart disease of native coronary artery without angina pectoris: Secondary | ICD-10-CM | POA: Diagnosis not present

## 2016-01-20 DIAGNOSIS — E785 Hyperlipidemia, unspecified: Secondary | ICD-10-CM | POA: Diagnosis not present

## 2016-01-20 MED ORDER — SERTRALINE HCL 100 MG PO TABS: 100.0000 mg | ORAL_TABLET | Freq: Every day | ORAL | 3 refills | Status: DC

## 2016-01-20 NOTE — Patient Instructions (Addendum)
If you develop cramps, just come in for labs only and we'll check potassium and magnesium  Request last labs from cardiologist

## 2016-01-20 NOTE — Progress Notes (Signed)
BP 130/80 (BP Location: Left Arm, Patient Position: Sitting, Cuff Size: Normal)   Pulse (!) 50   Temp 97.9 F (36.6 C) (Oral)   Resp 16   Wt 245 lb 5 oz (111.3 kg)   SpO2 97%   BMI 35.20 kg/m    Subjective:    Patient ID: Melvin Hatfield, male    DOB: 02/19/1970, 45 y.o.   MRN: [id number]  HPI: Melvin Hatfield is a 45 y.o. male  Chief Complaint  Patient presents with  . Medication Refill   Patient is here for medicine refills He has had issues with depression; since age 45 Feels good on medicine, feels stable and wishes to continue same dose Maternal grandmother committed suicide; strong Regular exercise, table tennis player  He is overdue for tetanus and declines shot No flu shot desired  He was taking trazodone and it really didn't help; not taking any more  He has coronary artery disease; [place]. Sherryll BurgerShah in [place]; playing table tennis at age 45; arrhythmia, pulse 162; trop rising next morning, 75 and 85% blockages; no family hx of similar issues; he was a terrible eater; father's side missing from family hx; room for improvement with eating; taking statin; he's going to do juice diet in 2018, fresh vegetables juicing; tried it before and felt amazing, had trouble with cramping in the past; they said his potassium and magnesium were low with the stent  [facility] 01/20/2016 01/12/2015 07/09/2014  Decreased Interest 0 0 1  Down, Depressed, Hopeless 0 0 1  PHQ - 2 Score 0 0 2  Altered sleeping - - 2  Tired, decreased energy - - 3  Change in appetite - - 0  Feeling bad or failure about yourself  - - 2  Trouble concentrating - - 2  Moving slowly or fidgety/restless - - 0  Suicidal thoughts - - 0  PHQ-9 Score - - 11  Difficult doing work/chores - - Somewhat difficult   Relevant past medical, surgical, family and social history reviewed Past Medical History:  Diagnosis Date  . Allergy   . Arthritis   . Coronary artery disease involving native coronary artery  without angina pectoris   . Depression   . GERD (gastroesophageal reflux disease)   . Hyperlipidemia   . Left nephrolithiasis 08/17/2014  . Major depressive disorder, recurrent episode, in partial remission (HCC) 07/09/2014   Doing well on current dosage of Sertraline 100mg .   . Myocardial infarction   . Sleep apnea    Past Surgical History:  Procedure Laterality Date  . CORONARY ANGIOPLASTY WITH STENT PLACEMENT    . reconstructed ear drum    . SPINE SURGERY    . TYMPANOSTOMY TUBE PLACEMENT     patient states he has had 6   Family History  Problem Relation Age of Onset  . Cancer Mother   . Depression Mother   . Kidney disease Neg Hx   . Prostate cancer Neg Hx    Social History  Substance Use Topics  . Smoking status: Never Smoker  . Smokeless tobacco: Not on file  . Alcohol use No   Interim medical history since last visit reviewed. Allergies and medications reviewed  Review of Systems Per HPI unless specifically indicated above     Objective:    BP 130/80 (BP Location: Left Arm, Patient Position: Sitting, Cuff Size: Normal)   Pulse (!) 50   Temp 97.9 F (36.6 C) (Oral)   Resp 16   Wt 245  lb 5 oz (111.3 kg)   SpO2 97%   BMI 35.20 kg/m   Wt Readings from Last 3 Encounters:  01/20/16 245 lb 5 oz (111.3 kg)  10/08/15 236 lb 3.2 oz (107.1 kg)  04/23/15 221 lb 3.2 oz (100.3 kg)    Physical Exam  Constitutional: He appears well-developed and well-nourished. No distress.  obese  Eyes: No scleral icterus.  Cardiovascular: Normal rate and regular rhythm.   Pulmonary/Chest: Effort normal and breath sounds normal.  Neurological: He is alert.  Skin: No pallor.  Psychiatric: He has a normal mood and affect. His mood appears not anxious. His affect is not blunt. Cognition and memory are not impaired. He does not express impulsivity. He does not exhibit a depressed mood. He expresses no homicidal and no suicidal ideation.  Good eye contact with examiner   Results for  orders placed or performed in visit on 10/08/15  Microscopic Examination  Result Value Ref Range   WBC, UA 0-5 0 - 5 /hpf   RBC, UA 3-10 (A) 0 - 2 /hpf   Epithelial Cells (non renal) None seen 0 - 10 /hpf   Bacteria, UA None seen None seen/Few  Urinalysis, Complete  Result Value Ref Range   Specific Gravity, UA 1.020 1.005 - 1.030   pH, UA 6.0 5.0 - 7.5   Color, UA Yellow Yellow   Appearance Ur Clear Clear   Leukocytes, UA Negative Negative   Protein, UA Negative Negative/Trace   Glucose, UA Negative Negative   Ketones, UA Negative Negative   RBC, UA Trace (A) Negative   Bilirubin, UA Negative Negative   Urobilinogen, Ur 0.2 0.2 - 1.0 mg/dL   Nitrite, UA Negative Negative   Microscopic Examination See below:   Bladder Scan (Post Void Residual) in office  Result Value Ref Range   Scan Result 54       Assessment & Plan:   Problem List Items Addressed This Visit      Cardiovascular and Mediastinum   Coronary artery disease involving native coronary artery without angina pectoris (Chronic)    Managed by cardiologist; s/p cardiac cath with Xience stents, RCA x2 and Cx      Relevant Medications   aspirin 81 MG chewable tablet   atorvastatin (LIPITOR) 20 MG tablet     Other   Major depressive disorder, recurrent episode, in partial remission (HCC)    Doing well on current regimen; continue same; seek help if mood worsens      Relevant Medications   sertraline (ZOLOFT) 100 MG tablet   Hyperlipidemia LDL goal <70 (Chronic)    Managed by cardiologist      Relevant Medications   aspirin 81 MG chewable tablet   atorvastatin (LIPITOR) 20 MG tablet       Follow up plan: Return in about 1 year (around 01/19/2017) for follow-up.  An after-visit summary was printed and given to the patient at check-out.  Please see the patient instructions which may contain other information and recommendations beyond what is mentioned above in the assessment and plan.  Meds ordered this  encounter  Medications  . aspirin 81 MG chewable tablet    Sig: Chew 81 mg by mouth.  Marland Kitchen atorvastatin (LIPITOR) 20 MG tablet    Sig: Take 20 mg by mouth.  . sertraline (ZOLOFT) 100 MG tablet    Sig: Take 1 tablet (100 mg total) by mouth daily.    Dispense:  90 tablet    Refill:  3

## 2016-01-29 ENCOUNTER — Encounter: Payer: Self-pay | Admitting: Family Medicine

## 2016-01-29 NOTE — Assessment & Plan Note (Signed)
Managed by cardiologist; s/p cardiac cath with Xience stents, RCA x2 and Cx

## 2016-01-29 NOTE — Assessment & Plan Note (Signed)
Managed by cardiologist 

## 2016-01-29 NOTE — Assessment & Plan Note (Signed)
Doing well on current regimen; continue same; seek help if mood worsens

## 2016-01-31 ENCOUNTER — Encounter: Payer: Self-pay | Admitting: Family Medicine

## 2016-01-31 ENCOUNTER — Encounter: Payer: Self-pay | Admitting: Urology

## 2016-03-01 ENCOUNTER — Encounter: Payer: Self-pay | Admitting: Family Medicine

## 2016-03-01 DIAGNOSIS — R252 Cramp and spasm: Secondary | ICD-10-CM

## 2016-03-01 NOTE — Assessment & Plan Note (Signed)
Check labs 

## 2016-03-02 ENCOUNTER — Other Ambulatory Visit: Payer: Self-pay

## 2016-03-02 DIAGNOSIS — R252 Cramp and spasm: Secondary | ICD-10-CM

## 2016-03-03 LAB — MAGNESIUM: Magnesium: 2.1 mg/dL (ref 1.6–2.3)

## 2016-03-03 LAB — BASIC METABOLIC PANEL
BUN/Creatinine Ratio: 14 (ref 9–20)
BUN: 13 mg/dL (ref 6–24)
CALCIUM: 9.4 mg/dL (ref 8.7–10.2)
CO2: 23 mmol/L (ref 18–29)
CREATININE: 0.93 mg/dL (ref 0.76–1.27)
Chloride: 101 mmol/L (ref 96–106)
GFR, EST AFRICAN AMERICAN: 114 mL/min/{1.73_m2} (ref >59)
GFR, EST NON AFRICAN AMERICAN: 99 mL/min/{1.73_m2} (ref >59)
Glucose: 98 mg/dL (ref 65–99)
POTASSIUM: 4.5 mmol/L (ref 3.5–5.2)
Sodium: 142 mmol/L (ref 134–144)

## 2016-03-24 ENCOUNTER — Encounter: Payer: Self-pay | Admitting: Urology

## 2016-08-17 ENCOUNTER — Ambulatory Visit (INDEPENDENT_AMBULATORY_CARE_PROVIDER_SITE_OTHER): Payer: BC Managed Care – PPO | Admitting: Family Medicine

## 2016-08-17 ENCOUNTER — Encounter: Payer: Self-pay | Admitting: Family Medicine

## 2016-08-17 DIAGNOSIS — Z5181 Encounter for therapeutic drug level monitoring: Secondary | ICD-10-CM | POA: Diagnosis not present

## 2016-08-17 DIAGNOSIS — F3341 Major depressive disorder, recurrent, in partial remission: Secondary | ICD-10-CM

## 2016-08-17 DIAGNOSIS — F3342 Major depressive disorder, recurrent, in full remission: Secondary | ICD-10-CM

## 2016-08-17 DIAGNOSIS — N200 Calculus of kidney: Secondary | ICD-10-CM

## 2016-08-17 DIAGNOSIS — I480 Paroxysmal atrial fibrillation: Secondary | ICD-10-CM | POA: Diagnosis not present

## 2016-08-17 DIAGNOSIS — E785 Hyperlipidemia, unspecified: Secondary | ICD-10-CM

## 2016-08-17 DIAGNOSIS — R253 Fasciculation: Secondary | ICD-10-CM

## 2016-08-17 DIAGNOSIS — G4730 Sleep apnea, unspecified: Secondary | ICD-10-CM

## 2016-08-17 DIAGNOSIS — M489 Spondylopathy, unspecified: Secondary | ICD-10-CM | POA: Diagnosis not present

## 2016-08-17 DIAGNOSIS — I2510 Atherosclerotic heart disease of native coronary artery without angina pectoris: Secondary | ICD-10-CM

## 2016-08-17 LAB — COMPLETE METABOLIC PANEL WITH GFR
ALT: 23 U/L (ref 9–46)
AST: 22 U/L (ref 10–40)
Albumin: 4.4 g/dL (ref 3.6–5.1)
Alkaline Phosphatase: 70 U/L (ref 40–115)
BUN: 15 mg/dL (ref 7–25)
CALCIUM: 9.7 mg/dL (ref 8.6–10.3)
CHLORIDE: 104 mmol/L (ref 98–110)
CO2: 29 mmol/L (ref 20–31)
Creat: 1.23 mg/dL (ref 0.60–1.35)
GFR, EST AFRICAN AMERICAN: 81 mL/min (ref >=60)
GFR, EST NON AFRICAN AMERICAN: 70 mL/min (ref >=60)
Glucose, Bld: 110 mg/dL — ABNORMAL HIGH (ref 65–99)
Potassium: 5 mmol/L (ref 3.5–5.3)
Sodium: 141 mmol/L (ref 135–146)
Total Bilirubin: 0.8 mg/dL (ref 0.2–1.2)
Total Protein: 7 g/dL (ref 6.1–8.1)

## 2016-08-17 LAB — LIPID PANEL
CHOLESTEROL: 215 mg/dL — AB (ref <200)
HDL: 45 mg/dL (ref >40)
LDL Cholesterol: 122 mg/dL — ABNORMAL HIGH (ref <100)
TRIGLYCERIDES: 239 mg/dL — AB (ref <150)
Total CHOL/HDL Ratio: 4.8 Ratio (ref <5.0)
VLDL: 48 mg/dL — ABNORMAL HIGH (ref <30)

## 2016-08-17 LAB — HEMOGLOBIN: Hemoglobin: 15.5 g/dL (ref 13.2–17.1)

## 2016-08-17 MED ORDER — SERTRALINE HCL 100 MG PO TABS: 100.0000 mg | ORAL_TABLET | Freq: Every day | ORAL | 3 refills | Status: DC

## 2016-08-17 NOTE — Assessment & Plan Note (Signed)
Try to drink more water 

## 2016-08-17 NOTE — Patient Instructions (Addendum)
Caution: prolonged use of proton pump inhibitors like omeprazole (Prilosec), pantoprazole (Protonix), esomeprazole (Nexium), and others like Dexilant and Aciphex may increase your risk of pneumonia, Clostridium difficile colitis, osteoporosis, anemia and other health complications Try to limit or avoid triggers like coffee, caffeinated beverages, onions, chocolate, spicy foods, peppermint, acid foods like pizza, spaghetti sauce, and orange juice Lose weight if you are overweight or obese Try elevating the head of your bed by placing a small wedge between your mattress and box springs to keep acid in the stomach at night instead of coming up into your esophagus We'll have you see the neurologist Do try to drink 64 ounces of water a day We'll get labs If you have not heard anything from my staff in a week about any orders/referrals/studies from today, please contact us here to follow-up [phone]

## 2016-08-17 NOTE — Progress Notes (Signed)
BP 130/78   Pulse (!) 52   Temp 98 F (36.7 C) (Oral)   Resp 14   Wt 241 lb 14.4 oz (109.7 kg)   SpO2 95%   BMI 34.71 kg/m    Subjective:    Patient ID: Melvin Hatfield, male    DOB: 1970-11-14, 46 y.o.   MRN: [id number]  HPI: Melvin Hatfield is a 46 y.o. male  Chief Complaint  Patient presents with  . Muscle Pain    cramsp spasms all over    HPI Patient is here for f/u Muscle aches and can see muscles twitching Started about a year ago Stopped the atorvastatin Has some trouble swallowing Improved by the acid medicine Not losing strength No change in balance No eyelid heaviness in the afternoon; tested for MG in his 20s Had C6-C7 fusion and had some spasming on the right, triceps, muscles deteriorated, then fusion was done and  OSA, using mask No pain in the neck  Had been on cholesterol medicine for a while  Not taking trazodone, did not really help Was on effexor for years, may have contributed to gout and kidney stones  Cardiologist is Dr. Crecencio Mc in [place]; no chest pain; seeing doctor once a year; eats what he wants; eats some fast food On ticagrelor; easy bruising, no worrisome bleeding from nose, gums, urine, stool; he did have blood in urine and saw urologist last fall; had the scope  IMPRESSION: 1. Bilateral renal calculi likely accounting for the patient's microhematuria. No obstructing ureteral calculi or bladder calculi. 2. Lower pole left renal cyst. No worrisome renal lesions and no bladder abnormality. 3. No other significant abdominal/pelvic findings.   Electronically Signed   By: Rudie Meyer M.D.   On: 04/12/2015 16:06  High cholesterol Has not been checked in a while Goal LDL is less than 70  On sertraline, working well, wishes to continue; no dark thoughts  GERD; on PPI; keep taking it   Depression screen [facility] 2/9 08/17/2016 01/20/2016 01/12/2015 07/09/2014  Decreased Interest 0 0 0 1  Down, Depressed, Hopeless 0 0 0 1  PHQ - 2 Score 0 0  0 2  Altered sleeping - - - 2  Tired, decreased energy - - - 3  Change in appetite - - - 0  Feeling bad or failure about yourself  - - - 2  Trouble concentrating - - - 2  Moving slowly or fidgety/restless - - - 0  Suicidal thoughts - - - 0  PHQ-9 Score - - - 11  Difficult doing work/chores - - - Somewhat difficult    Relevant past medical, surgical, family and social history reviewed Past Medical History:  Diagnosis Date  . Allergy   . Arthritis   . Coronary artery disease involving native coronary artery without angina pectoris   . Depression   . GERD (gastroesophageal reflux disease)   . Hyperlipidemia   . Left nephrolithiasis 08/17/2014  . Major depressive disorder, recurrent episode, in partial remission (HCC) 07/09/2014   Doing well on current dosage of Sertraline 100mg .   . Myocardial infarction (HCC)   . Sleep apnea    Past Surgical History:  Procedure Laterality Date  . CORONARY ANGIOPLASTY WITH STENT PLACEMENT    . reconstructed ear drum    . SPINE SURGERY    . TYMPANOSTOMY TUBE PLACEMENT     patient states he has had 6   Family History  Problem Relation Age of Onset  . Cancer Mother   .  Depression Mother   . Stroke Maternal Grandmother   . Stroke Maternal Grandfather   . Kidney disease Neg Hx   . Prostate cancer Neg Hx    Social History   Social History  . Marital status: Married    Spouse name: N/A  . Number of children: N/A  . Years of education: N/A   Occupational History  . Not on file.   Social History Main Topics  . Smoking status: Never Smoker  . Smokeless tobacco: Never Used  . Alcohol use No  . Drug use: No  . Sexual activity: No   Other Topics Concern  . Not on file   Social History Narrative  . No narrative on file    Interim medical history since last visit reviewed. Allergies and medications reviewed  Review of Systems Per HPI unless specifically indicated above     Objective:    BP 130/78   Pulse (!) 52   Temp 98 F  (36.7 C) (Oral)   Resp 14   Wt 241 lb 14.4 oz (109.7 kg)   SpO2 95%   BMI 34.71 kg/m   Wt Readings from Last 3 Encounters:  08/17/16 241 lb 14.4 oz (109.7 kg)  01/20/16 245 lb 5 oz (111.3 kg)  10/08/15 236 lb 3.2 oz (107.1 kg)    Physical Exam  Constitutional: He appears well-developed and well-nourished. No distress.  HENT:  Head: Normocephalic and atraumatic.  Eyes: EOM are normal. No scleral icterus.  Neck: No thyromegaly present.  Cardiovascular: Regular rhythm.   No extrasystoles are present. Bradycardia present.   Pulmonary/Chest: Effort normal and breath sounds normal.  Abdominal: Soft. Bowel sounds are normal. He exhibits no distension.  Musculoskeletal: He exhibits no edema.  Neurological: Coordination normal.  Reflex Scores:      Bicep reflexes are 1+ on the right side and 1+ on the left side.      Patellar reflexes are 2+ on the right side and 2+ on the left side. No visible muscle fasciculations noted on arms  Skin: Skin is warm and dry. No pallor.  Psychiatric: He has a normal mood and affect. His behavior is normal. Judgment and thought content normal. His mood appears not anxious. He does not exhibit a depressed mood. He expresses no homicidal and no suicidal ideation.  Good eye contact with examiner      Assessment & Plan:   Problem List Items Addressed This Visit      Cardiovascular and Mediastinum   PAF (paroxysmal atrial fibrillation) (HCC)    Sinus brady today      Coronary artery disease involving native coronary artery without angina pectoris (Chronic)    Chest pain free, seeing cardiologist; goal LDL is less than 70      Relevant Orders   Lipid panel     Respiratory   Sleep apnea in adult    Using CPAP        Musculoskeletal and Integument   Cervical spine disease    Hx of fusion at C6-C7; wondering if related to muscle symptoms; refer to neurologist        Genitourinary   Left nephrolithiasis    Try to drink more water         Other   Muscle fasciculation    Not related to electrolyte abnormalities; off of statin; will refer to neurologist      Relevant Orders   Ambulatory referral to Neurology   Medication monitoring encounter    Check labs today  Relevant Orders   COMPLETE METABOLIC PANEL WITH GFR   Hemoglobin   Major depressive disorder, recurrent episode, in partial remission (HCC)    Continue medicine      Relevant Medications   sertraline (ZOLOFT) 100 MG tablet   Hyperlipidemia LDL goal <70 (Chronic)    Goal LDL elss than 70, check today      Relevant Orders   Lipid panel    Other Visit Diagnoses    Major depressive disorder, recurrent episode, in full remission (HCC)       Relevant Medications   sertraline (ZOLOFT) 100 MG tablet       Follow up plan: Return in about 6 months (around 02/17/2017) for twenty minute follow-up with fasting labs.  An after-visit summary was printed and given to the patient at check-out.  Please see the patient instructions which may contain other information and recommendations beyond what is mentioned above in the assessment and plan.  Meds ordered this encounter  Medications  . sertraline (ZOLOFT) 100 MG tablet    Sig: Take 1 tablet (100 mg total) by mouth daily.    Dispense:  90 tablet    Refill:  3    He has a few refills left; leave on file please; thank you    Orders Placed This Encounter  Procedures  . COMPLETE METABOLIC PANEL WITH GFR  . Lipid panel  . Hemoglobin  . Ambulatory referral to Neurology

## 2016-08-17 NOTE — Assessment & Plan Note (Signed)
Check labs today.

## 2016-08-17 NOTE — Assessment & Plan Note (Signed)
Chest pain free, seeing cardiologist; goal LDL is less than 70

## 2016-08-17 NOTE — Assessment & Plan Note (Signed)
Using CPAP 

## 2016-08-17 NOTE — Assessment & Plan Note (Signed)
Hx of fusion at C6-C7; wondering if related to muscle symptoms; refer to neurologist

## 2016-08-17 NOTE — Assessment & Plan Note (Signed)
Sinus brady today

## 2016-08-17 NOTE — Assessment & Plan Note (Signed)
Goal LDL elss than 70, check today

## 2016-08-17 NOTE — Assessment & Plan Note (Signed)
Not related to electrolyte abnormalities; off of statin; will refer to neurologist

## 2016-08-17 NOTE — Assessment & Plan Note (Signed)
Continue medicine 

## 2016-09-12 ENCOUNTER — Other Ambulatory Visit: Payer: Self-pay | Admitting: Family Medicine

## 2016-09-12 NOTE — Progress Notes (Signed)
Cardiology note reviewed; crestor 5 mg and fish oil started; he wants lipids rechecked in 2 months

## 2016-09-28 ENCOUNTER — Other Ambulatory Visit: Payer: Self-pay

## 2016-09-28 ENCOUNTER — Other Ambulatory Visit: Payer: BC Managed Care – PPO

## 2016-09-28 ENCOUNTER — Encounter: Payer: Self-pay | Admitting: Urology

## 2016-09-28 DIAGNOSIS — N401 Enlarged prostate with lower urinary tract symptoms: Secondary | ICD-10-CM

## 2016-10-05 ENCOUNTER — Ambulatory Visit: Payer: BC Managed Care – PPO | Admitting: Urology

## 2016-11-06 ENCOUNTER — Other Ambulatory Visit: Payer: BC Managed Care – PPO

## 2016-11-06 DIAGNOSIS — N401 Enlarged prostate with lower urinary tract symptoms: Secondary | ICD-10-CM

## 2016-11-07 LAB — PSA: Prostate Specific Ag, Serum: 0.9 ng/mL (ref 0.0–4.0)

## 2016-11-10 NOTE — Progress Notes (Signed)
9:42 AM   Domingo Madeira 05/31/1970 [id number]  Referring provider: Edwena Felty, MD [address] [place], [place] [address]  Chief Complaint  Patient presents with  . Benign Prostatic Hypertrophy    1 year follow up   . Nephrolithiasis  . Hematuria    HPI: Patient is a 46 year old Caucasian male who presents today for a 6 month follow up for BPH and LUTS, nephrolithiasis and a history of hematuria.    BPH WITH LUTS His IPSS score today is 8, which is moderate lower urinary tract symptomatology.  He is pleased with his quality life due to his urinary symptoms.   His I PSS score 11/3.   His PVR was 54 mL.  His major complaint today is leakage of urine and intermittency.  He has had these symptoms for over one year.  He denies any dysuria, hematuria or suprapubic pain.   He also denies any recent fevers, chills, nausea or vomiting.  He does not have a family history of PCa.      IPSS    Row Name 11/13/16 0900         International Prostate Symptom Score   How often have you had the sensation of not emptying your bladder? Not at All     How often have you had to urinate less than every two hours? About half the time     How often have you found you stopped and started again several times when you urinated? Less than 1 in 5 times     How often have you found it difficult to postpone urination? Not at All     How often have you had a weak urinary stream? About half the time     How often have you had to strain to start urination? Not at All     How many times did you typically get up at night to urinate? 1 Time     Total IPSS Score 8       Quality of Life due to urinary symptoms   If you were to spend the rest of your life with your urinary condition just the way it is now how would you feel about that? Pleased        Score:  1-7 Mild 8-19 Moderate 20-35 Severe  Bilateral nephrolithiasis Patient's KUB taken today noted a left lower pole 11 mm  nephrolithiasis which is stable.  The previous punctate right lower pole stone was unable to be visualized.  He has not had any flank pain or gross hematuria.    History of hematuria Incidental on UA. No gross hematuria or flank pain.  CT urogram on 04/12/2015 reviewed personally. This reveals bilateral small nonobstructing calculi. He does also have a known left lower pole renal cyst which was previously incompletely characterized, but CT urogram shows definitively that this is not a worrisome or enhancing lesion rather benign cyst.  Cystoscopy performed on 04/23/2015 with Dr. Apolinar Junes noted mild trabeculation of the bladder with a slightly enlarged prostate.   UA today is negative.   PMH: Past Medical History:  Diagnosis Date  . Allergy   . Arthritis   . Coronary artery disease involving native coronary artery without angina pectoris   . Depression   . GERD (gastroesophageal reflux disease)   . Hyperlipidemia   . Left nephrolithiasis 08/17/2014  . Major depressive disorder, recurrent episode, in partial remission (HCC) 07/09/2014   Doing well on current dosage of Sertraline  100mg .   . Myocardial infarction (HCC)   . Sleep apnea     Surgical History: Past Surgical History:  Procedure Laterality Date  . CORONARY ANGIOPLASTY WITH STENT PLACEMENT    . reconstructed ear drum    . SPINE SURGERY    . TYMPANOSTOMY TUBE PLACEMENT     patient states he has had 6    Home Medications:  Allergies as of 11/13/2016   No Known Allergies     Medication List       Accurate as of 11/13/16  9:42 AM. Always use your most recent med list.          aspirin 81 MG chewable tablet Chew 81 mg by mouth.   BRILINTA 90 MG Tabs tablet Generic drug:  ticagrelor Take 90 mg by mouth.   CRESTOR 5 MG tablet Generic drug:  rosuvastatin Take 1 tablet (5 mg total) by mouth at bedtime.   omeprazole 20 MG capsule Commonly known as:  PRILOSEC Take 1 capsule (20 mg total) by mouth daily.     sertraline 100 MG tablet Commonly known as:  ZOLOFT Take 1 tablet (100 mg total) by mouth daily.       Allergies: No Known Allergies  Family History: Family History  Problem Relation Age of Onset  . Cancer Mother   . Depression Mother   . Stroke Maternal Grandmother   . Stroke Maternal Grandfather   . Kidney disease Neg Hx   . Prostate cancer Neg Hx     Social History:  reports that he has never smoked. He has never used smokeless tobacco. He reports that he does not drink alcohol or use drugs.  ROS: UROLOGY Frequent Urination?: No Hard to postpone urination?: No Burning/pain with urination?: No Get up at night to urinate?: No Leakage of urine?: No Urine stream starts and stops?: Yes Trouble starting stream?: No Do you have to strain to urinate?: No Blood in urine?: No Urinary tract infection?: No Sexually transmitted disease?: No Injury to kidneys or bladder?: No Painful intercourse?: No Weak stream?: No Erection problems?: No Penile pain?: No  Gastrointestinal Nausea?: No Vomiting?: No Indigestion/heartburn?: No Diarrhea?: No Constipation?: No  Constitutional Fever: No Night sweats?: No Weight loss?: No Fatigue?: No  Skin Skin rash/lesions?: No Itching?: No  Eyes Blurred vision?: No Double vision?: No  Ears/Nose/Throat Sore throat?: No Sinus problems?: No  Hematologic/Lymphatic Swollen glands?: No Easy bruising?: No  Cardiovascular Leg swelling?: No Chest pain?: No  Respiratory Cough?: No Shortness of breath?: No  Endocrine Excessive thirst?: No  Musculoskeletal Back pain?: No Joint pain?: No  Neurological Headaches?: No Dizziness?: No  Psychologic Depression?: No Anxiety?: No  Physical Exam: Blood pressure 126/75, pulse (!) 49, height 5\' 10"  (1.778 m), weight 242 lb 9.6 oz (110 kg). Constitutional: Well nourished. Alert and oriented, No acute distress. HEENT: [place] AT, moist mucus membranes. Trachea midline, no  masses. Cardiovascular: No clubbing, cyanosis, or edema. Respiratory: Normal respiratory effort, no increased work of breathing. GI: Abdomen is soft, non tender, non distended, no abdominal masses. Liver and spleen not palpable.  No hernias appreciated.  Stool sample for occult testing is not indicated.   GU: No CVA tenderness.  No bladder fullness or masses.  Patient with circumcised phallus.  Urethral meatus is patent.  No penile discharge. No penile lesions or rashes. Scrotum without lesions, cysts, rashes and/or edema.  Testicles are located scrotally bilaterally. No masses are appreciated in the testicles. Left and right epididymis are normal. Rectal: Patient  with  normal sphincter tone. Anus and perineum without scarring or rashes. No rectal masses are appreciated. Prostate is approximately 55 grams, no nodules are appreciated. Seminal vesicles are normal. Skin: No rashes, bruises or suspicious lesions. Lymph: No cervical or inguinal adenopathy. Neurologic: Grossly intact, no focal deficits, moving all 4 extremities. Psychiatric: Normal mood and affect.   Laboratory Data: PSA 0.7 ng/mL on 10/07/2015 PSA 0.9 ng/mL on 11/06/2016  Urinalysis  Negative.  See EPIC.    Pertinent Imaging: CLINICAL DATA:  History of kidney stones.  EXAM: ABDOMEN - 1 VIEW  COMPARISON:  Radiographs dated 10/08/2015 and 07/09/2014 and CT scan dated 04/12/2015  FINDINGS: There is a crescentic 11 mm stone in the lower pole of the left kidney, unchanged. No appreciable right renal calculi.  The gas pattern is normal. Phleboliths in the pelvis. Bones are normal.  IMPRESSION: Stable stone in the lower pole of the left kidney. Otherwise benign appearing abdomen.   Electronically Signed   By: Francene Boyers M.D.   On: 11/13/2016 09:10  I have independently reviewed the films  Assessment & Plan:    1. BPH with LUTS  - IPSS score is 8/1, it is improving  - Continue conservative management,  avoiding bladder irritants and timed voiding's  - RTC in 12 months for IPSS, PSA and exam   2. History of hematuria  - UA is negative  - completed a hematuria work up in 03/2015  - RTC in one year for UA  - will report any gross hematuria  3. Bilateral nephrolithiasis  - stable left renal stone - no right renal stones seen on KUB  - RTC in one year for KUB  - not interested in intervention at this time  - Advised to contact our office or seek treatment in the ED if becomes febrile or pain/ vomiting are difficult control in order to arrange for emergent/urgent intervention  Return in about 1 year (around 11/13/2017) for KUB, UA, PSA, exam and I PSS.  Michiel Cowboy, PA-C  [facility] [address], [address] [place], [place] [address] [phone]

## 2016-11-13 ENCOUNTER — Encounter: Payer: Self-pay | Admitting: Urology

## 2016-11-13 ENCOUNTER — Other Ambulatory Visit: Payer: Self-pay | Admitting: Urology

## 2016-11-13 ENCOUNTER — Ambulatory Visit
Admission: RE | Admit: 2016-11-13 | Discharge: 2016-11-13 | Disposition: A | Discharge status: Home / Self Care | Payer: BC Managed Care – PPO | Source: Ambulatory Visit | Attending: Urology | Admitting: Urology

## 2016-11-13 ENCOUNTER — Ambulatory Visit (INDEPENDENT_AMBULATORY_CARE_PROVIDER_SITE_OTHER): Payer: BC Managed Care – PPO | Admitting: Urology

## 2016-11-13 VITALS — BP 126/75 | HR 49 | Ht 70.0 in | Wt 242.6 lb

## 2016-11-13 DIAGNOSIS — Z87442 Personal history of urinary calculi: Secondary | ICD-10-CM

## 2016-11-13 DIAGNOSIS — Z87448 Personal history of other diseases of urinary system: Secondary | ICD-10-CM

## 2016-11-13 DIAGNOSIS — N138 Other obstructive and reflux uropathy: Secondary | ICD-10-CM | POA: Diagnosis not present

## 2016-11-13 DIAGNOSIS — N200 Calculus of kidney: Secondary | ICD-10-CM | POA: Insufficient documentation

## 2016-11-13 DIAGNOSIS — N401 Enlarged prostate with lower urinary tract symptoms: Secondary | ICD-10-CM | POA: Diagnosis not present

## 2016-11-13 LAB — URINALYSIS, COMPLETE
BILIRUBIN UA: NEGATIVE
GLUCOSE, UA: NEGATIVE
Ketones, UA: NEGATIVE
LEUKOCYTES UA: NEGATIVE
Nitrite, UA: NEGATIVE
PH UA: 5.5 (ref 5.0–7.5)
PROTEIN UA: NEGATIVE
SPEC GRAV UA: 1.02 (ref 1.005–1.030)
Urobilinogen, Ur: 0.2 mg/dL (ref 0.2–1.0)

## 2016-11-13 IMAGING — CR DG ABDOMEN 1V
1 series · 2 of 2 positions shown · non-contrast
Comparison: Radiographs dated [DATE] and [DATE] and CT scan
dated [DATE]

CLINICAL DATA: History of kidney stones.

EXAM:
ABDOMEN - 1 VIEW

[Series 1: dg abd 1 view · 0.14mm/px · 2 of 2 slices shown]
[im 1/2]
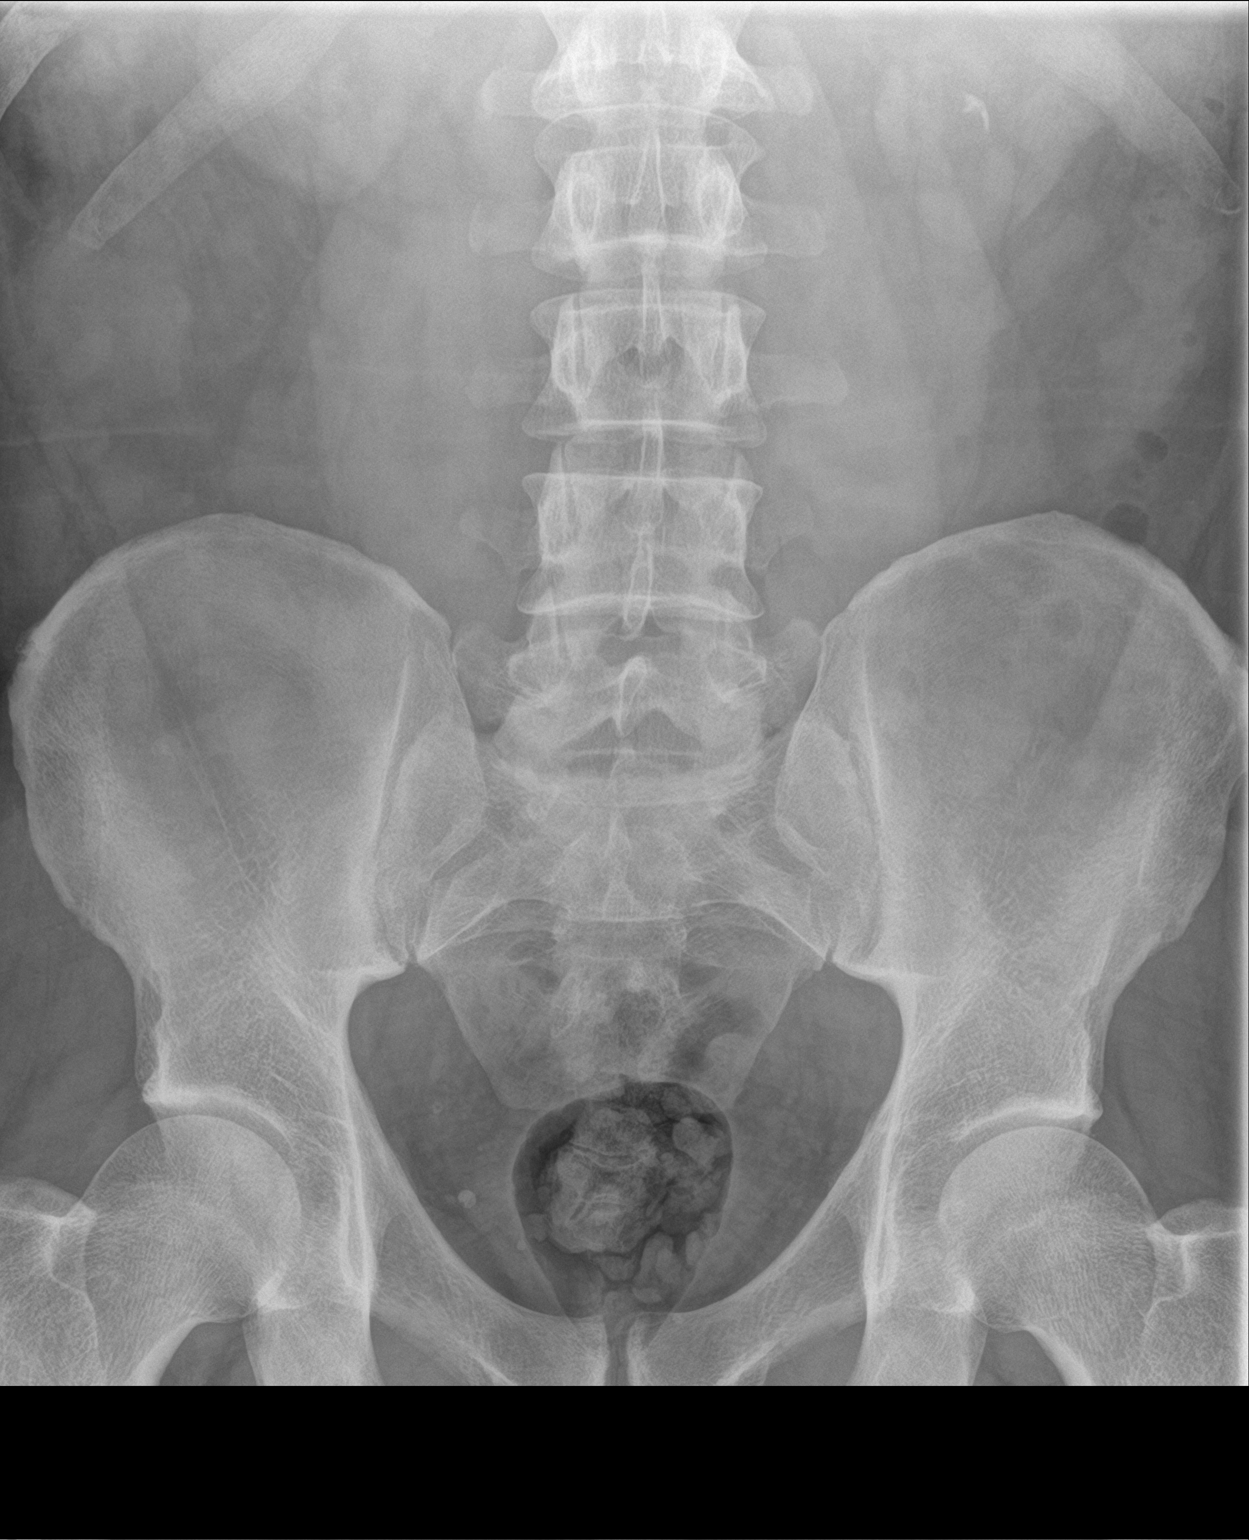
[im 2/2]
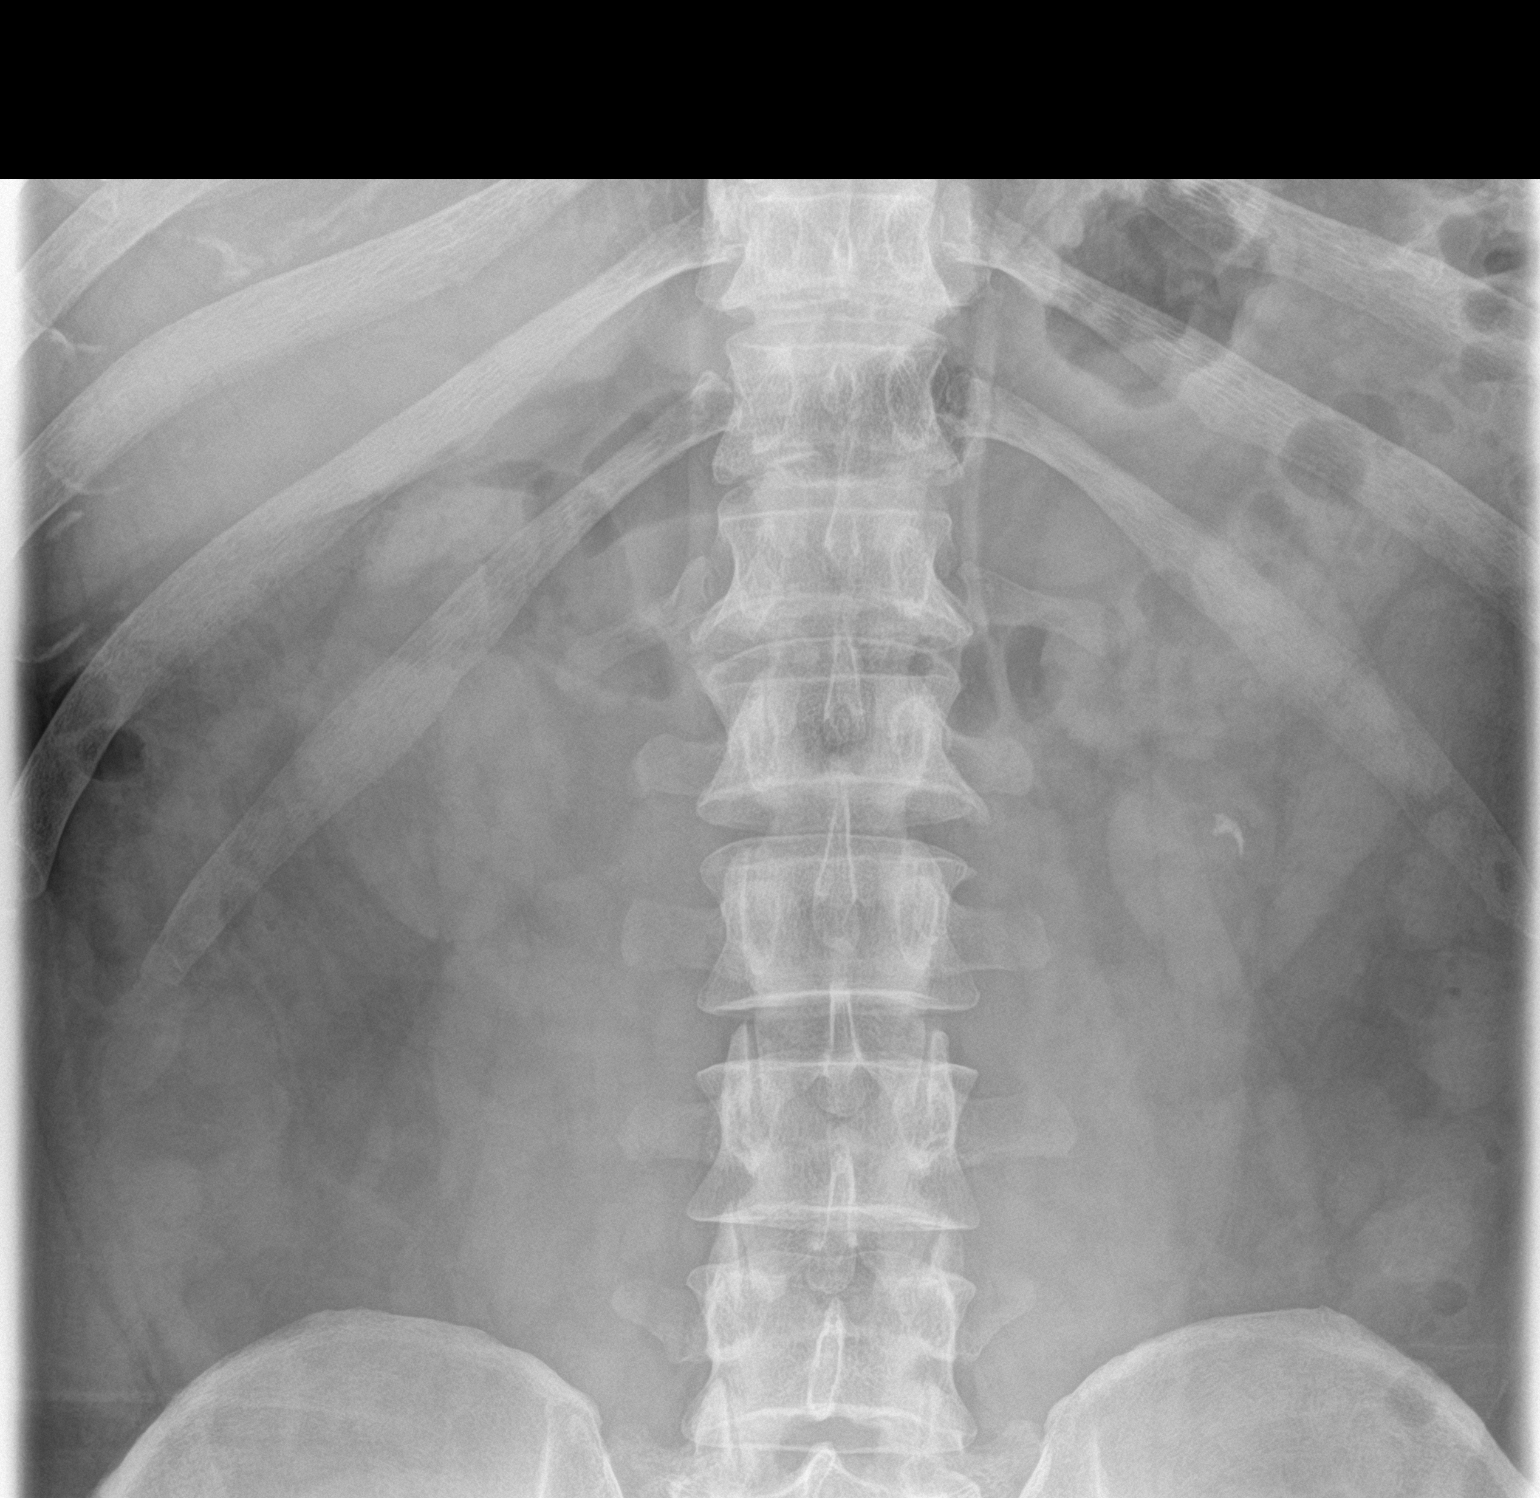

[2 of 2 positions shown; findings below may reference images not displayed]

FINDINGS: There is a crescentic 11 mm stone in the lower pole of the left
kidney, unchanged. No appreciable right renal calculi.

The gas pattern is normal. Phleboliths in the pelvis. Bones are
normal.
IMPRESSION: Stable stone in the lower pole of the left kidney. Otherwise benign
appearing abdomen.

## 2016-11-13 NOTE — Progress Notes (Signed)
Order for KUB is in 

## 2017-01-19 ENCOUNTER — Ambulatory Visit: Payer: BC Managed Care – PPO | Admitting: Family Medicine

## 2017-01-24 ENCOUNTER — Encounter: Payer: Self-pay | Admitting: Family Medicine

## 2017-01-24 DIAGNOSIS — F3342 Major depressive disorder, recurrent, in full remission: Secondary | ICD-10-CM

## 2017-02-19 ENCOUNTER — Ambulatory Visit: Payer: BC Managed Care – PPO | Admitting: Family Medicine

## 2017-02-20 ENCOUNTER — Encounter: Payer: Self-pay | Admitting: Urology

## 2017-02-21 NOTE — Telephone Encounter (Signed)
Pt was last seen in clinic in Oct. 2018. Appt made. Pt aware & voices understanding. No further questions at this time.

## 2017-03-11 NOTE — Progress Notes (Signed)
Closing out lab/order note open since:  Feb 2018 

## 2017-03-11 NOTE — Telephone Encounter (Signed)
No further documentation, but note is over a year old and I see the lab results; purpose of note met; will close it out

## 2017-03-12 ENCOUNTER — Encounter: Payer: Self-pay | Admitting: Urology

## 2017-03-12 ENCOUNTER — Telehealth: Payer: Self-pay | Admitting: Urology

## 2017-03-12 ENCOUNTER — Ambulatory Visit: Payer: BC Managed Care – PPO | Admitting: Urology

## 2017-03-12 VITALS — BP 151/79 | HR 54 | Ht 70.0 in | Wt 245.1 lb

## 2017-03-12 DIAGNOSIS — N200 Calculus of kidney: Secondary | ICD-10-CM

## 2017-03-12 DIAGNOSIS — Z87448 Personal history of other diseases of urinary system: Secondary | ICD-10-CM | POA: Diagnosis not present

## 2017-03-12 NOTE — Patient Instructions (Addendum)
Percutaneous Nephrolithotomy Percutaneous nephrolithotomy is a procedure to remove kidney stones. Kidney stones are deposits that form inside your kidneys and can cause pain. You may need this procedure if:  You have large kidney stones. Kidney stones that are bigger than 2 cm (0.78 in) wide may require this procedure.  Your kidney stones are oddly shaped.  Other treatments have not been successful in helping the kidney stones to pass.  You have developed an infection due to the kidney stones.  Tell a health care provider about:  Any allergies you have.  All medicines you are taking, including vitamins, herbs, eye drops, creams, and over-the-counter medicines.  Any problems you or family members have had with anesthetic medicines.  Any blood disorders you have.  Any surgeries you have had.  Any medical conditions you have.  Whether you are pregnant or may be pregnant.  Whether you use any tobacco products, including cigarettes, chewing tobacco, or e-cigarettes. What are the risks? Generally, this is a safe procedure. However, problems may occur, including:  Infection.  Bleeding. This may include blood in your urine.  Allergic reactions to medicines.  Damage to other structures or organs.  Kidney damage.  Holes in the kidney. These often heal on their own.  Numbness or tingling in the affected area.  Sometimes, not all of the kidney stones are able to be removed with this procedure, so you may need a different procedure to remove them. What happens before the procedure?  Follow instructions from your health care provider about eating or drinking restrictions.  Ask your health care provider about: ? Changing or stopping your regular medicines. This is especially important if you are taking diabetes medicines or blood thinners. ? Taking medicines such as aspirin and ibuprofen. These medicines can thin your blood. Do not take these medicines before your procedure if  your health care provider instructs you not to.  Plan to have someone take you home after the procedure.  If you go home right after the procedure, plan to have someone with you for 24 hours.  You may have tests, including: ? Blood tests. ? Urine tests. ? Tests to check how your heart is working.  Ask your health care provider how your surgical site will be marked or identified.  You may be given antibiotic medicine to help prevent infection. What happens during the procedure?  To reduce your risk of infection: ? Your health care team will wash or sanitize their hands. ? Your skin will be washed with soap.  An IV tube will be inserted into one of your veins.  You will be given one or more of the following: ? A medicine to help you relax (sedative). ? A medicine to numb the area (local anesthetic). ? A medicine to make you fall asleep (general anesthetic). ? A medicine that is injected into your spine to numb the area below and slightly above the injection site (spinal anesthetic). ? A medicine that is injected into an area of your body to numb everything below the injection site (regional anesthetic).  A thin tube (catheter) will be put in your bladder to drain urine during and after the procedure.  Your surgeon will make a small cut (incision) in your lower back.  A tube will be inserted through the incision into your kidney.  Each kidney stone will be removed through this tube. Larger stones may need to be broken up with a high-intensity light beam (laser) or other tools.  If a kidney   stone left the kidney, your surgeon will bring it back to the kidney and then remove it through the tube.  After all of the stones have been removed, a kidney drain tube will be put in. This will help to drain any fluid that builds up while your kidney heals.  Part of the incision may be closed with stitches (sutures).  A bandage (dressing) will be placed over the incision area. The  procedure may vary among health care providers and hospitals. What happens after the procedure?  Your blood pressure, heart rate, breathing rate, and blood oxygen level will be monitored often until the medicines you were given have worn off.  You may be given medicine for pain.  You will be encouraged to walk. Walking helps to prevent blood clots.  You may be taught breathing exercises.  Do not drive for 24 hours if you received a sedative. This information is not intended to replace advice given to you by your health care provider. Make sure you discuss any questions you have with your health care provider. Document Released: 11/06/2008 Document Revised: 06/17/2015 Document Reviewed: 07/06/2014 Elsevier Interactive Patient Education  [employer].  Lithotripsy Lithotripsy is a treatment that can sometimes help eliminate kidney stones and the pain that they cause. A form of lithotripsy, also known as extracorporeal shock wave lithotripsy, is a nonsurgical procedure that crushes a kidney stone with shock waves. These shock waves pass through your body and focus on the kidney stone. They cause the kidney stone to break up while it is still in the urinary tract. This makes it easier for the smaller pieces of stone to pass in the urine. Tell a health care provider about:  Any allergies you have.  All medicines you are taking, including vitamins, herbs, eye drops, creams, and over-the-counter medicines.  Any blood disorders you have.  Any surgeries you have had.  Any medical conditions you have.  Whether you are pregnant or may be pregnant.  Any problems you or family members have had with anesthetic medicines. What are the risks? Generally, this is a safe procedure. However, problems may occur, including:  Infection.  Bleeding of the kidney.  Bruising of the kidney or skin.  Scarring of the kidney, which can lead to: ? Increased blood pressure. ? Poor kidney  function. ? Return (recurrence) of kidney stones.  Damage to other structures or organs, such as the liver, colon, spleen, or pancreas.  Blockage (obstruction) of the the tube that carries urine from the kidney to the bladder (ureter).  Failure of the kidney stone to break into pieces (fragments).  What happens before the procedure? Staying hydrated Follow instructions from your health care provider about hydration, which may include:  Up to 2 hours before the procedure - you may continue to drink clear liquids, such as water, clear fruit juice, black coffee, and plain tea.  Eating and drinking restrictions Follow instructions from your health care provider about eating and drinking, which may include:  8 hours before the procedure - stop eating heavy meals or foods such as meat, fried foods, or fatty foods.  6 hours before the procedure - stop eating light meals or foods, such as toast or cereal.  6 hours before the procedure - stop drinking milk or drinks that contain milk.  2 hours before the procedure - stop drinking clear liquids.  General instructions  Plan to have someone take you home from the hospital or clinic.  Ask your health care provider  about: ? Changing or stopping your regular medicines. This is especially important if you are taking diabetes medicines or blood thinners. ? Taking medicines such as aspirin and ibuprofen. These medicines and other NSAIDs can thin your blood. Do not take these medicines for 7 days before your procedure if your health care provider instructs you not to.  You may have tests, such as: ? Blood tests. ? Urine tests. ? Imaging tests, such as a CT scan. What happens during the procedure?  To lower your risk of infection: ? Your health care team will wash or sanitize their hands. ? Your skin will be washed with soap.  An IV tube will be inserted into one of your veins. This tube will give you fluids and medicines.  You will be  given one or more of the following: ? A medicine to help you relax (sedative). ? A medicine to make you fall asleep (general anesthetic).  A water-filled cushion may be placed behind your kidney or on your abdomen. In some cases you may be placed in a tub of lukewarm water.  Your body will be positioned in a way that makes it easy to target the kidney stone.  A flexible tube with holes in it (stent) may be placed in the ureter. This will help keep urine flowing from the kidney if the fragments of the stone have been blocking the ureter.  An X-ray or ultrasound exam will be done to locate your stone.  Shock waves will be aimed at the stone. If you are awake, you may feel a tapping sensation as the shock waves pass through your body. The procedure may vary among health care providers and hospitals. What happens after the procedure?  You may have an X-ray to see whether the procedure was able to break up the kidney stone and how much of the stone has passed. If large stone fragments remain after treatment, you may need to have a second procedure at a later time.  Your blood pressure, heart rate, breathing rate, and blood oxygen level will be monitored until the medicines you were given have worn off.  You may be given antibiotics or pain medicine as needed.  If a stent was placed in your ureter during surgery, it may stay in place for a few weeks.  You may need strain your urine to collect pieces of the kidney stone for testing.  You will need to drink plenty of water.  Do not drive for 24 hours if you were given a sedative. Summary  Lithotripsy is a treatment that can sometimes help eliminate kidney stones and the pain that they cause.  A form of lithotripsy, also known as extracorporeal shock wave lithotripsy, is a nonsurgical procedure that crushes a kidney stone with shock waves.  Generally, this is a safe procedure. However, problems may occur, including damage to the kidney or  other organs, infection, or obstruction of the tube that carries urine from the kidney to the bladder (ureter).  When you go home, you will need to drink plenty of water. You may be asked to strain your urine to collect pieces of the kidney stone for testing. This information is not intended to replace advice given to you by your health care provider. Make sure you discuss any questions you have with your health care provider. Document Released: 01/07/2000 Document Revised: 12/01/2015 Document Reviewed: 12/01/2015 Elsevier Interactive Patient Education  2018 [employer]  Laser Therapy for Kidney Stones Laser therapy for kidney stones  is a procedure to break up small, hard mineral deposits that form in the kidney (kidney stones). The procedure is done using a device that produces a focused beam of light (laser). The laser breaks up kidney stones into pieces that are small enough to be passed out of the body through urination or removed from the body. You may need laser therapy if you have kidney stones that are painful or block your urinary tract. Thisprocedure is done by inserting a tube (ureteroscope) into your kidney through the urethral opening. The urethra is the tube that caries urine out of the body. In women, the urethra opens above the vaginal opening. In men, the urethra opens at the tip of the penis. The ureteroscope is inserted through the urethra, and surgical instruments are moved through the bladder and the tube that connects the kidney to the bladder (ureter) until they reach the kidney. Tell a health care provider about:  Any allergies you have.  All medicines you are taking, including vitamins, herbs, eye drops, creams, and over-the-counter medicines.  Any problems you or family members have had with anesthetic medicines.  Any blood disorders you have.  Any surgeries you have had.  Any medical conditions you have.  Whether you are pregnant or may be pregnant. What are  the risks? Generally, this is a safe procedure. However, problems may occur, including:  Infection.  Bleeding.  Allergic reactions to medicines.  Damage to the urethra, bladder, or ureter.  Urinary tract infection (UTI).  Narrowing of the urethra (urethral stricture).  Difficulty passing urine.  Blockage of the kidney caused by a fragment of kidney stone.  What happens before the procedure?  Follow instructions from your health care provider about eating or drinking restrictions.  Ask your health care provider about: ? Changing or stopping your regular medicines. This is especially important if you are taking diabetes medicines or blood thinners. ? Taking medicines such as aspirin and ibuprofen. These medicines can thin your blood. Do not take these medicines before your procedure if your health care provider instructs you not to.  You may have a physical exam before the procedure. The exam may include imaging studies and blood or urine tests.  You may be given antibiotic medicine to treat or prevent infection.  If your ureter is too narrow, your health care provider may place a soft, flexible tube (stent) inside of it. The stent may be placed days or weeks before your laser therapy procedure.  Ask your health care provider how your surgical site will be marked or identified.  Plan to have someone take you home after the procedure.  If you will be going home right after the procedure, plan to have someone stay with you for 24 hours. What happens during the procedure?  To reduce your risk of infection: ? Your health care team will wash or sanitize their hands. ? Your skin will be washed with soap.  An IV tube will be inserted into one of your veins.  You will be given one or more of the following: ? A medicine to help you relax (sedative). ? A medicine to numb the area (local anesthetic). ? A medicine to make you fall asleep (general anesthetic).  A ureteroscope will  be inserted into your urethra. The ureteroscope will send images to a video screen in the operating room to guide your surgeon to the area of your kidney that will be treated.  A tube will be threaded through your bladder and ureter,  up to your kidney.  The laser device will be inserted into your kidney through the tube. Your surgeon will pulse the laser on and off to break up kidney stones.  A surgical instrument that has a tiny wire basket may be inserted into your kidney to remove the pieces of broken kidney stone. The procedure may vary among health care providers and hospitals. What happens after the procedure?  Your blood pressure, heart rate, breathing rate, and blood oxygen level will be monitored often until the medicines you were given have worn off.  You will be given pain medicine as needed.  You may continue to receive antibiotics.  You may have a stent temporarily placed in your ureter.  You may be asked to strain your urine to collect any stone fragments that you pass. These fragments may be tested.  Do not drive for 24 hours if you received a sedative. This information is not intended to replace advice given to you by your health care provider. Make sure you discuss any questions you have with your health care provider. Document Released: 02/05/2015 Document Revised: 06/17/2015 Document Reviewed: 12/03/2014 Elsevier Interactive Patient Education  2018 [employer].  Ureteral Stent Implantation Ureteral stent implantation is a procedure to insert (implant) a flexible, soft, plastic tube (stent) into a tube (ureter) that drains urine from the kidneys. The stent supports the ureter while it heals and helps to drain urine from the kidneys. You may have a ureteral stent implanted after having a procedure to remove a blockage from the ureter (ureterolysis or pyeloplasty).You may also have a stent implanted to open the flow of urine when you have a blockage caused by a kidney  stone, tumor, blood clot, or infection. You have two ureters, one on each side of the body. The ureters connect the kidneys to the organ that holds urine until it passes out of the body (bladder). The stent is placed so that one end is in the kidney, and one end is in the bladder. The stent is usually taken out after your ureter has healed. Depending on your condition, you may have a stent for just a few weeks, or you may have a long-term stent that will need to be replaced every few months. Tell a health care provider about:  Any allergies you have.  All medicines you are taking, including vitamins, herbs, eye drops, creams, and over-the-counter medicines.  Any problems you or family members have had with anesthetic medicines.  Any blood disorders you have.  Any surgeries you have had.  Any medical conditions you have.  Whether you are pregnant or may be pregnant. What are the risks? Generally, this is a safe procedure. However, problems may occur, including:  Infection.  Bleeding.  Allergic reactions to medicines.  Damage to other structures or organs. Tearing (perforation) of the ureter is possible.  Movement of the stent away from where it is placed during surgery (migration).  What happens before the procedure?  Ask your health care provider about: ? Changing or stopping your regular medicines. This is especially important if you are taking diabetes medicines or blood thinners. ? Taking medicines such as aspirin and ibuprofen. These medicines can thin your blood. Do not take these medicines before your procedure if your health care provider instructs you not to.  Follow instructions from your health care provider about eating or drinking restrictions.  Do not drink alcohol and do not use any tobacco products before your procedure, as told by your health  care provider.  You may be given antibiotic medicine to help prevent infection.  Plan to have someone take you home  after the procedure.  If you go home right after the procedure, plan to have someone with you for 24 hours. What happens during the procedure?  An IV tube will be inserted into one of your veins.  You will be given a medicine to make you fall asleep (general anesthetic). You may also be given a medicine to help you relax (sedative).  A thin, tube-shaped instrument with a light and tiny camera at the end (cystoscope) will be inserted into your urethra. The urethra is the tube that drains urine from the bladder out of the body. In men, the urethra opens at the end of the penis. In women, the urethra opens in front of the vaginal opening.  The cystoscope will be passed into your bladder.  A thin wire (guide wire) will be passed through your bladder and into your ureter. This is used to guide the stent into your ureter.  The stent will be inserted into your ureter.  The guide wire and the cystoscope will be removed.  A flexible tube (catheter) will be inserted through your urethra so that one end is in your bladder. This helps to drain urine from your bladder. The procedure may vary among hospitals and health care providers. What happens after the procedure?  Your blood pressure, heart rate, breathing rate, and blood oxygen level will be monitored often until the medicines you were given have worn off.  You may continue to receive medicine and fluids through an IV tube.  You may have some soreness or pain in your abdomen and urethra. Medicines will be available to help you.  You will be encouraged to get up and walk around as soon as you can.  You will have a catheter draining your urine.  You will have some blood in your urine.  Do not drive for 24 hours if you received a sedative. This information is not intended to replace advice given to you by your health care provider. Make sure you discuss any questions you have with your health care provider. Document Released: 01/07/2000  Document Revised: 06/17/2015 Document Reviewed: 07/24/2014 Elsevier Interactive Patient Education  [employer].

## 2017-03-12 NOTE — Progress Notes (Signed)
3:50 PM   Melvin Hatfield [birth year] [id number]  Referring provider: Kerman Passey, MD [address] [place], [place] [address]  Chief Complaint  Patient presents with  . Nephrolithiasis    HPI: 47 year old WM with BPH with LU TS, nephrolithiasis and a history of hematuria who presents today to discuss definitive management for his stones.  He states he has not had any flank pain or gross hematuria.  He has heard "horror stories" regarding the passage of a large stone and would like his stone treated prior to it passing on its own.  Patient denies any gross hematuria, dysuria or suprapubic/flank pain.  Patient denies any fevers, chills, nausea or vomiting.   His most recent CT was performed in 03/2015 and it noted bilateral renal calculi likely accounting for the patient's microhematuria. No obstructing ureteral calculi or bladder calculi.  Lower pole left renal cyst. No worrisome renal lesions and no bladder abnormality.  No other significant abdominal/pelvic findings.     Patient's KUB taken on 11/13/2016 noted a left lower pole 11 mm nephrolithiasis which is stable.  The previous punctate right lower pole stone was unable to be visualized.    History of hematuria Incidental on UA. No gross hematuria or flank pain.  CT urogram on 04/12/2015 reviewed personally. This reveals bilateral small nonobstructing calculi. He does also have a known left lower pole renal cyst which was previously incompletely characterized, but CT urogram shows definitively that this is not a worrisome or enhancing lesion rather benign cyst.  Cystoscopy performed on 04/23/2015 with Dr. Apolinar Junes noted mild trabeculation of the bladder with a slightly enlarged prostate.     PMH: Past Medical History:  Diagnosis Date  . Allergy   . Arthritis   . Coronary artery disease involving native coronary artery without angina pectoris   . Depression   . GERD (gastroesophageal reflux disease)   . Hyperlipidemia    . Left nephrolithiasis 08/17/2014  . Major depressive disorder, recurrent episode, in partial remission (HCC) 07/09/2014   Doing well on current dosage of Sertraline 100mg .   . Myocardial infarction (HCC)   . Sleep apnea     Surgical History: Past Surgical History:  Procedure Laterality Date  . CORONARY ANGIOPLASTY WITH STENT PLACEMENT    . reconstructed ear drum    . SPINE SURGERY    . TYMPANOSTOMY TUBE PLACEMENT     patient states he has had 6    Home Medications:  Allergies as of 03/12/2017   No Known Allergies     Medication List        Accurate as of 03/12/17  3:50 PM. Always use your most recent med list.          aspirin 81 MG chewable tablet Chew 81 mg by mouth.   BRILINTA 90 MG Tabs tablet Generic drug:  ticagrelor Take 90 mg by mouth.   CRESTOR 5 MG tablet Generic drug:  rosuvastatin Take 1 tablet (5 mg total) by mouth at bedtime.   omeprazole 20 MG capsule Commonly known as:  PRILOSEC Take 1 capsule (20 mg total) by mouth daily.   sertraline 100 MG tablet Commonly known as:  ZOLOFT Take 1 tablet (100 mg total) by mouth daily.       Allergies: No Known Allergies  Family History: Family History  Problem Relation Age of Onset  . Cancer Mother   . Depression Mother   . Stroke Maternal Grandmother   . Stroke Maternal Grandfather   .  Kidney disease Neg Hx   . Prostate cancer Neg Hx     Social History:  reports that  has never smoked. he has never used smokeless tobacco. He reports that he does not drink alcohol or use drugs.  ROS: UROLOGY Frequent Urination?: No Hard to postpone urination?: No Burning/pain with urination?: No Get up at night to urinate?: No Leakage of urine?: Yes Urine stream starts and stops?: Yes Trouble starting stream?: No Do you have to strain to urinate?: No Blood in urine?: No Urinary tract infection?: No Sexually transmitted disease?: No Injury to kidneys or bladder?: No Painful intercourse?: No Weak  stream?: Yes Erection problems?: No Penile pain?: No Gastrointestinal Nausea?: No Vomiting?: No Indigestion/heartburn?: No Diarrhea?: No Constipation?: No Constitutional Fever: No Night sweats?: No Weight loss?: No Fatigue?: No Skin Skin rash/lesions?: No Itching?: No Eyes Blurred vision?: No Double vision?: No Ears/Nose/Throat Sore throat?: No Sinus problems?: No Hematologic/Lymphatic Swollen glands?: No Easy bruising?: No Cardiovascular Leg swelling?: No Chest pain?: No Respiratory Cough?: No Shortness of breath?: No Endocrine Excessive thirst?: No Musculoskeletal Back pain?: No Joint pain?: No Neurological Headaches?: No Dizziness?: No Psychologic Depression?: No Anxiety?: No   Physical Exam: Blood pressure (!) 151/79, pulse (!) 54, height 5\' 10"  (1.778 m), weight 245 lb 1.6 oz (111.2 kg), SpO2 99 %. Constitutional: Well nourished. Alert and oriented, No acute distress. HEENT: [place] AT, moist mucus membranes. Trachea midline, no masses. Cardiovascular: No clubbing, cyanosis, or edema. Respiratory: Normal respiratory effort, no increased work of breathing. GI: Abdomen is soft, non tender, non distended, no abdominal masses. Liver and spleen not palpable.  No hernias appreciated.  Stool sample for occult testing is not indicated.   GU: No CVA tenderness.  No bladder fullness or masses.   Skin: No rashes, bruises or suspicious lesions. Lymph: No cervical or inguinal adenopathy. Neurologic: Grossly intact, no focal deficits, moving all 4 extremities. Psychiatric: Normal mood and affect.   Laboratory Data: PSA 0.7 ng/mL on 10/07/2015 PSA 0.9 ng/mL on 11/06/2016  I have reviewed the labs.  Assessment & Plan:    1. Bilateral nephrolithiasis  - stable left renal stone - no right renal stones seen on KUB  - patient is now interested in intervention at this time  - will obtain a CT Renal stone study   - discussed that based on the stones current size and  location, he may choose to undergo ESWL, URS or PCNL - explained the success rate and risks of the three procedures - he is leaning towards ESWL at this time  - I will call him with the CT results and give recommendations  - Advised to contact our office or seek treatment in the ED if becomes febrile or pain/ vomiting are difficult control in order to arrange for emergent/urgent intervention    2. History of hematuria  - completed a hematuria work up in 03/2015  - RTC in one year for UA  - will report any gross hematuria   Return for I will call patient with results.  Haddy Mullinax, PA-C  I spent 25 minutes in a face to face conversation regarding stone treatment greater than 50% was spent in counseling & coordination of care with the patient.

## 2017-03-12 NOTE — H&P (View-Only) (Signed)
3:50 PM   Melvin Hatfield [birth year] [id number]  Referring provider: Kerman Passey, MD [address] [place], [place] [address]  Chief Complaint  Patient presents with  . Nephrolithiasis    HPI: 47 year old WM with BPH with LU TS, nephrolithiasis and a history of hematuria who presents today to discuss definitive management for his stones.  He states he has not had any flank pain or gross hematuria.  He has heard "horror stories" regarding the passage of a large stone and would like his stone treated prior to it passing on its own.  Patient denies any gross hematuria, dysuria or suprapubic/flank pain.  Patient denies any fevers, chills, nausea or vomiting.   His most recent CT was performed in 03/2015 and it noted bilateral renal calculi likely accounting for the patient's microhematuria. No obstructing ureteral calculi or bladder calculi.  Lower pole left renal cyst. No worrisome renal lesions and no bladder abnormality.  No other significant abdominal/pelvic findings.     Patient's KUB taken on 11/13/2016 noted a left lower pole 11 mm nephrolithiasis which is stable.  The previous punctate right lower pole stone was unable to be visualized.    History of hematuria Incidental on UA. No gross hematuria or flank pain.  CT urogram on 04/12/2015 reviewed personally. This reveals bilateral small nonobstructing calculi. He does also have a known left lower pole renal cyst which was previously incompletely characterized, but CT urogram shows definitively that this is not a worrisome or enhancing lesion rather benign cyst.  Cystoscopy performed on 04/23/2015 with Dr. Apolinar Junes noted mild trabeculation of the bladder with a slightly enlarged prostate.     PMH: Past Medical History:  Diagnosis Date  . Allergy   . Arthritis   . Coronary artery disease involving native coronary artery without angina pectoris   . Depression   . GERD (gastroesophageal reflux disease)   . Hyperlipidemia    . Left nephrolithiasis 08/17/2014  . Major depressive disorder, recurrent episode, in partial remission (HCC) 07/09/2014   Doing well on current dosage of Sertraline 100mg .   . Myocardial infarction (HCC)   . Sleep apnea     Surgical History: Past Surgical History:  Procedure Laterality Date  . CORONARY ANGIOPLASTY WITH STENT PLACEMENT    . reconstructed ear drum    . SPINE SURGERY    . TYMPANOSTOMY TUBE PLACEMENT     patient states he has had 6    Home Medications:  Allergies as of 03/12/2017   No Known Allergies     Medication List        Accurate as of 03/12/17  3:50 PM. Always use your most recent med list.          aspirin 81 MG chewable tablet Chew 81 mg by mouth.   BRILINTA 90 MG Tabs tablet Generic drug:  ticagrelor Take 90 mg by mouth.   CRESTOR 5 MG tablet Generic drug:  rosuvastatin Take 1 tablet (5 mg total) by mouth at bedtime.   omeprazole 20 MG capsule Commonly known as:  PRILOSEC Take 1 capsule (20 mg total) by mouth daily.   sertraline 100 MG tablet Commonly known as:  ZOLOFT Take 1 tablet (100 mg total) by mouth daily.       Allergies: No Known Allergies  Family History: Family History  Problem Relation Age of Onset  . Cancer Mother   . Depression Mother   . Stroke Maternal Grandmother   . Stroke Maternal Grandfather   .  Kidney disease Neg Hx   . Prostate cancer Neg Hx     Social History:  reports that  has never smoked. he has never used smokeless tobacco. He reports that he does not drink alcohol or use drugs.  ROS: UROLOGY Frequent Urination?: No Hard to postpone urination?: No Burning/pain with urination?: No Get up at night to urinate?: No Leakage of urine?: Yes Urine stream starts and stops?: Yes Trouble starting stream?: No Do you have to strain to urinate?: No Blood in urine?: No Urinary tract infection?: No Sexually transmitted disease?: No Injury to kidneys or bladder?: No Painful intercourse?: No Weak  stream?: Yes Erection problems?: No Penile pain?: No Gastrointestinal Nausea?: No Vomiting?: No Indigestion/heartburn?: No Diarrhea?: No Constipation?: No Constitutional Fever: No Night sweats?: No Weight loss?: No Fatigue?: No Skin Skin rash/lesions?: No Itching?: No Eyes Blurred vision?: No Double vision?: No Ears/Nose/Throat Sore throat?: No Sinus problems?: No Hematologic/Lymphatic Swollen glands?: No Easy bruising?: No Cardiovascular Leg swelling?: No Chest pain?: No Respiratory Cough?: No Shortness of breath?: No Endocrine Excessive thirst?: No Musculoskeletal Back pain?: No Joint pain?: No Neurological Headaches?: No Dizziness?: No Psychologic Depression?: No Anxiety?: No   Physical Exam: Blood pressure (!) 151/79, pulse (!) 54, height 5\' 10"  (1.778 m), weight 245 lb 1.6 oz (111.2 kg), SpO2 99 %. Constitutional: Well nourished. Alert and oriented, No acute distress. HEENT: [place] AT, moist mucus membranes. Trachea midline, no masses. Cardiovascular: No clubbing, cyanosis, or edema. Respiratory: Normal respiratory effort, no increased work of breathing. GI: Abdomen is soft, non tender, non distended, no abdominal masses. Liver and spleen not palpable.  No hernias appreciated.  Stool sample for occult testing is not indicated.   GU: No CVA tenderness.  No bladder fullness or masses.   Skin: No rashes, bruises or suspicious lesions. Lymph: No cervical or inguinal adenopathy. Neurologic: Grossly intact, no focal deficits, moving all 4 extremities. Psychiatric: Normal mood and affect.   Laboratory Data: PSA 0.7 ng/mL on 10/07/2015 PSA 0.9 ng/mL on 11/06/2016  I have reviewed the labs.  Assessment & Plan:    1. Bilateral nephrolithiasis  - stable left renal stone - no right renal stones seen on KUB  - patient is now interested in intervention at this time  - will obtain a CT Renal stone study   - discussed that based on the stones current size and  location, he may choose to undergo ESWL, URS or PCNL - explained the success rate and risks of the three procedures - he is leaning towards ESWL at this time  - I will call him with the CT results and give recommendations  - Advised to contact our office or seek treatment in the ED if becomes febrile or pain/ vomiting are difficult control in order to arrange for emergent/urgent intervention    2. History of hematuria  - completed a hematuria work up in 03/2015  - RTC in one year for UA  - will report any gross hematuria   Return for I will call patient with results.  Yareli Carthen, PA-C  I spent 25 minutes in a face to face conversation regarding stone treatment greater than 50% was spent in counseling & coordination of care with the patient.

## 2017-03-21 ENCOUNTER — Ambulatory Visit
Admission: RE | Admit: 2017-03-21 | Discharge: 2017-03-21 | Disposition: A | Discharge status: Home / Self Care | Payer: BC Managed Care – PPO | Source: Ambulatory Visit | Attending: Urology | Admitting: Urology

## 2017-03-21 DIAGNOSIS — N200 Calculus of kidney: Secondary | ICD-10-CM | POA: Diagnosis not present

## 2017-03-21 DIAGNOSIS — N281 Cyst of kidney, acquired: Secondary | ICD-10-CM | POA: Diagnosis not present

## 2017-03-21 IMAGING — CT CT RENAL STONE PROTOCOL
2 of 4 series · 16 of 46 positions shown, 18 images · non-contrast
Comparison: [DATE]

CLINICAL DATA: Kidney stones

EXAM:
CT ABDOMEN AND PELVIS WITHOUT CONTRAST
TECHNIQUE: Multidetector CT imaging of the abdomen and pelvis was performed
following the standard protocol without IV contrast.

[Series 2: renal stone · axial · 0.82mm/px · z∈[-1740,-1275]mm · 13 of 103 slices shown, 15 images (1 of 2)]
[im 5/103  soft-tissue]
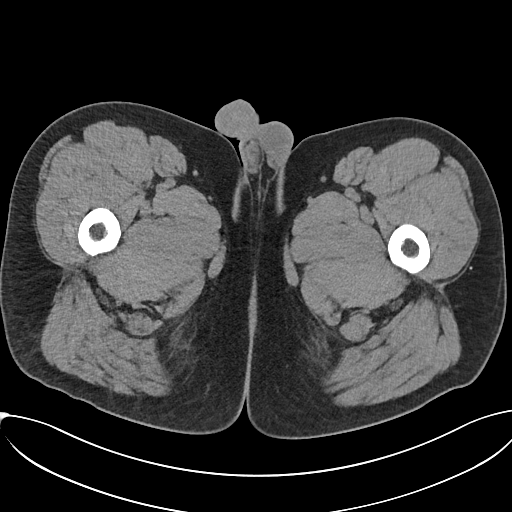
[im 5/103  bone]
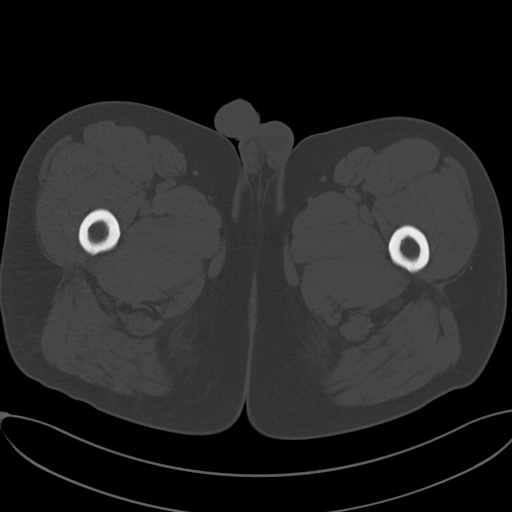
[im 13/103  soft-tissue]
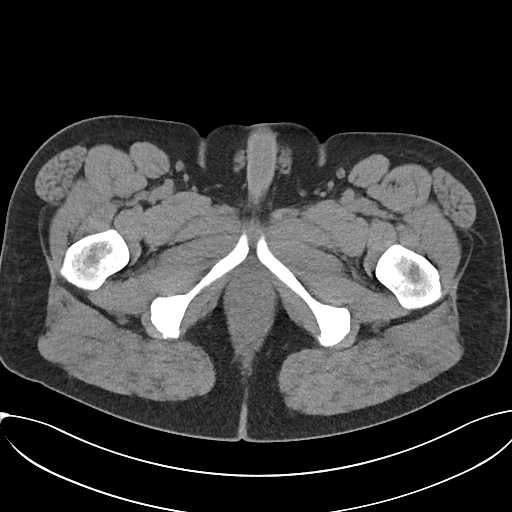
[im 22/103  soft-tissue]
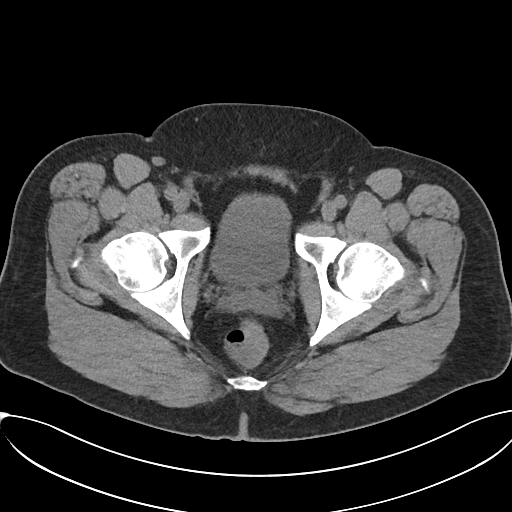
[im 30/103  soft-tissue]
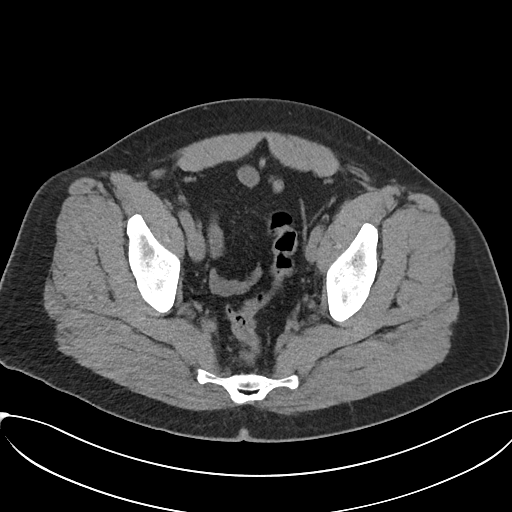
[im 35/103  soft-tissue]
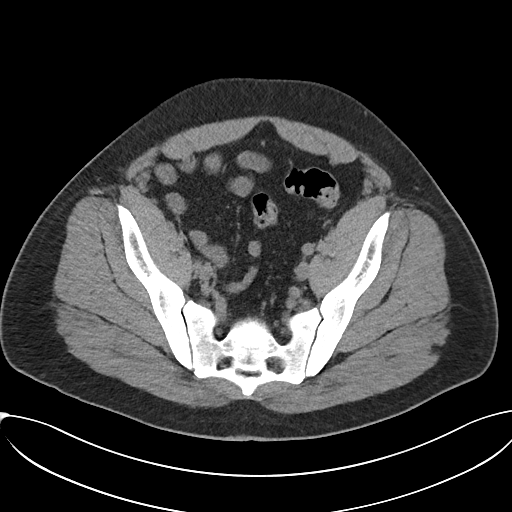
[im 43/103  soft-tissue]
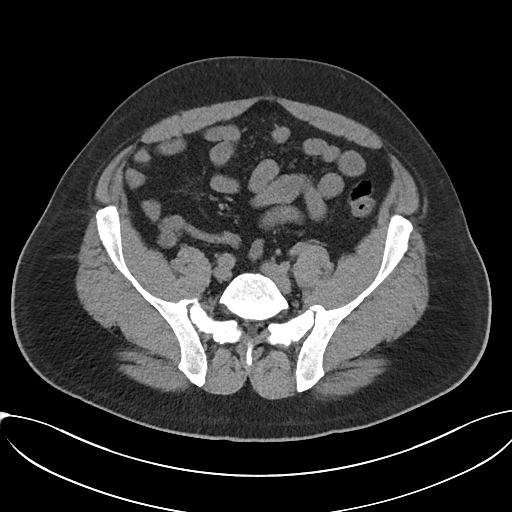
[im 52/103  soft-tissue]
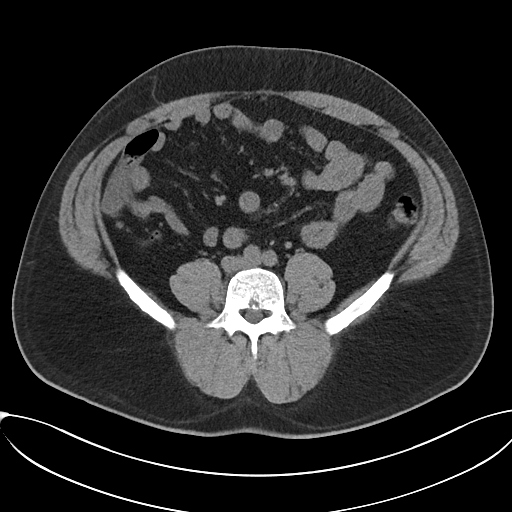
[im 60/103  soft-tissue]
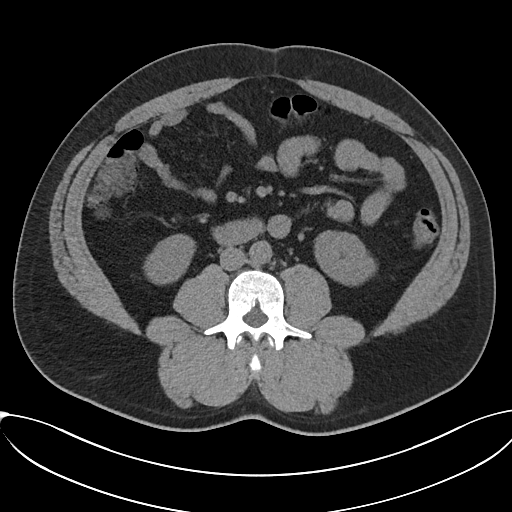
[im 69/103  soft-tissue]
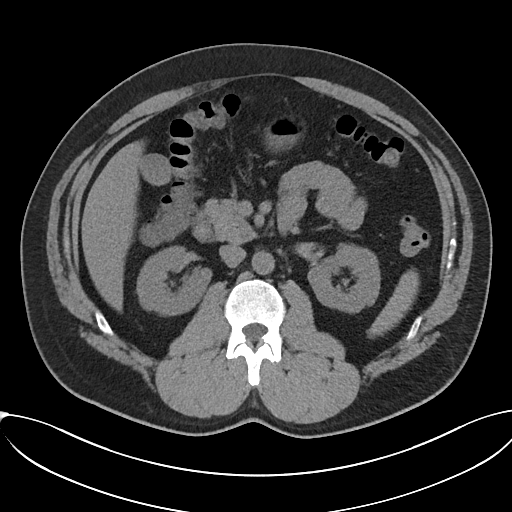
[im 69/103  bone]
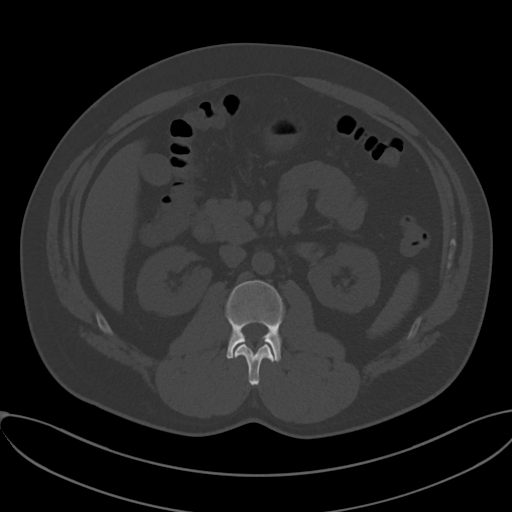
[im 73/103  soft-tissue]
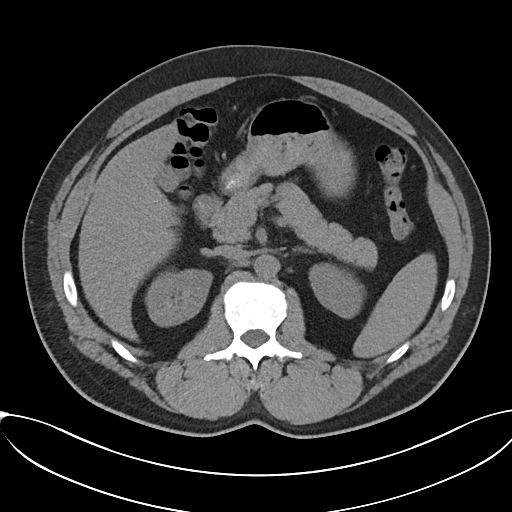
[im 81/103  soft-tissue]
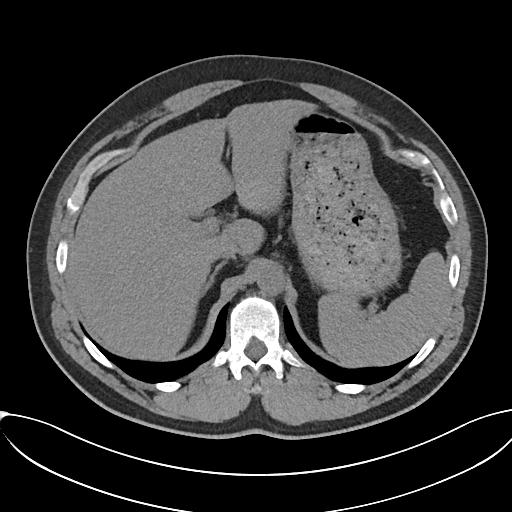
[im 90/103  soft-tissue]
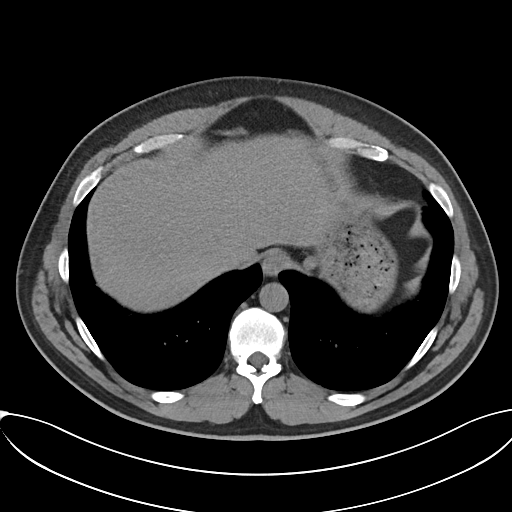
[im 98/103  soft-tissue]
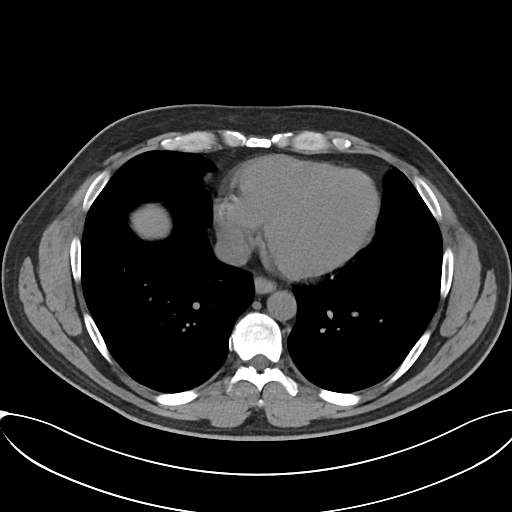

[Series 5: renal stone · coronal · 0.82mm/px · 3 of 164 slices shown (2 of 2)]
[im 55/164  soft-tissue]
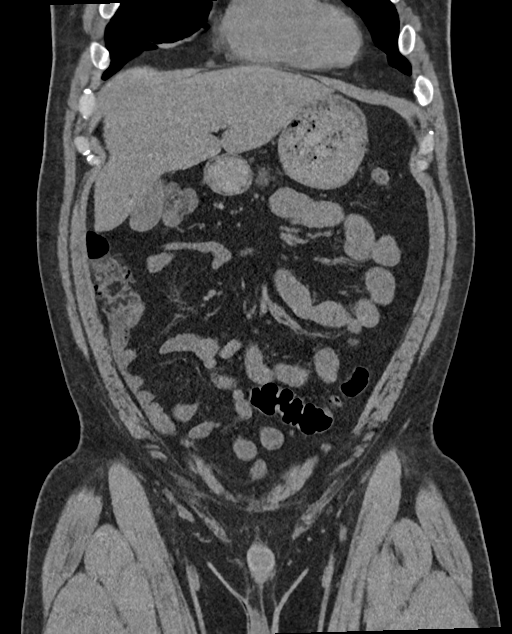
[im 73/164  soft-tissue]
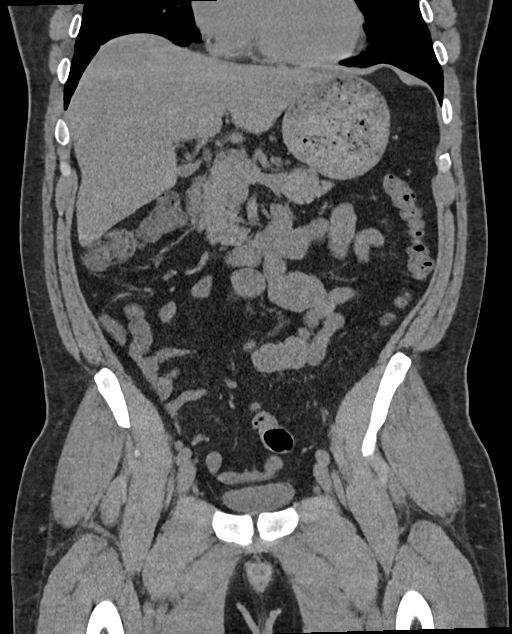
[im 91/164  soft-tissue]
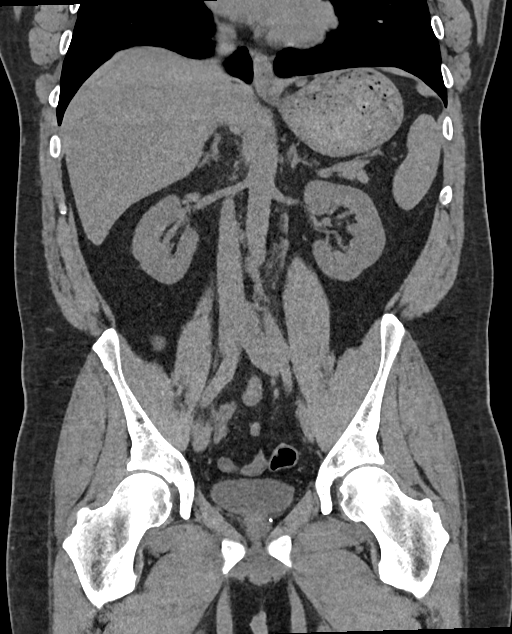

[16 of 46 positions shown; findings below may reference images not displayed]

FINDINGS: Lower chest: Lung bases are clear.

Hepatobiliary: 12 mm cyst in segment 2 (series 2/image 16).

Gallbladder is unremarkable. No intrahepatic or extrahepatic ductal
dilatation.

Pancreas: Within normal limits.

Spleen: Within normal limits.

Adrenals/Urinary Tract: Adrenal glands within normal limits.

4 mm nonobstructing right lower pole renal calculus (series 2/image
41). Additional punctate calculus in the upper pole (coronal image
104).

Two left lower pole renal calculi measuring up to 5 mm (series
2/image 42). Additional punctate calculus in the upper pole (coronal
image 94). 15 mm lateral left lower pole renal cyst (series 2/image
42), better evaluated on prior enhanced CT, simple.

No ureteral or bladder calculi.  No hydronephrosis.

Bladder is within normal limits.

Stomach/Bowel: Stomach is within normal limits.

No evidence of bowel obstruction.

Normal appendix (series 2/image 53).

Vascular/Lymphatic: No evidence of abdominal aortic aneurysm.

No suspicious abdominopelvic lymphadenopathy.

Reproductive: Prostate is unremarkable.

Other: No abdominopelvic ascites.

Musculoskeletal: Visualized osseous structures are within normal
limits.
IMPRESSION: Bilateral nonobstructing renal calculi, measuring up to 5 mm in the
left lower pole, as above. No ureteral or bladder calculi. No
hydronephrosis.

15 mm left lower pole renal cyst, benign (Bosniak I).

## 2017-03-22 ENCOUNTER — Other Ambulatory Visit: Payer: Self-pay | Admitting: Radiology

## 2017-03-22 DIAGNOSIS — N200 Calculus of kidney: Secondary | ICD-10-CM

## 2017-03-23 ENCOUNTER — Other Ambulatory Visit: Payer: Self-pay | Admitting: Radiology

## 2017-03-28 ENCOUNTER — Other Ambulatory Visit: Payer: Self-pay | Admitting: Radiology

## 2017-03-28 DIAGNOSIS — N200 Calculus of kidney: Secondary | ICD-10-CM

## 2017-04-05 ENCOUNTER — Encounter
Admission: RE | Disposition: A | Discharge status: Home / Self Care | Payer: Self-pay | Source: Ambulatory Visit | Attending: Urology

## 2017-04-05 ENCOUNTER — Ambulatory Visit
Admission: RE | Admit: 2017-04-05 | Discharge: 2017-04-05 | Disposition: A | Discharge status: Home / Self Care | Payer: BC Managed Care – PPO | Source: Ambulatory Visit | Attending: Urology | Admitting: Urology

## 2017-04-05 ENCOUNTER — Ambulatory Visit: Payer: BC Managed Care – PPO

## 2017-04-05 DIAGNOSIS — K219 Gastro-esophageal reflux disease without esophagitis: Secondary | ICD-10-CM | POA: Insufficient documentation

## 2017-04-05 DIAGNOSIS — F329 Major depressive disorder, single episode, unspecified: Secondary | ICD-10-CM | POA: Insufficient documentation

## 2017-04-05 DIAGNOSIS — Z7982 Long term (current) use of aspirin: Secondary | ICD-10-CM | POA: Insufficient documentation

## 2017-04-05 DIAGNOSIS — Z7902 Long term (current) use of antithrombotics/antiplatelets: Secondary | ICD-10-CM | POA: Diagnosis not present

## 2017-04-05 DIAGNOSIS — E785 Hyperlipidemia, unspecified: Secondary | ICD-10-CM | POA: Insufficient documentation

## 2017-04-05 DIAGNOSIS — Z955 Presence of coronary angioplasty implant and graft: Secondary | ICD-10-CM | POA: Insufficient documentation

## 2017-04-05 DIAGNOSIS — E669 Obesity, unspecified: Secondary | ICD-10-CM | POA: Diagnosis not present

## 2017-04-05 DIAGNOSIS — N200 Calculus of kidney: Secondary | ICD-10-CM | POA: Diagnosis not present

## 2017-04-05 DIAGNOSIS — I2510 Atherosclerotic heart disease of native coronary artery without angina pectoris: Secondary | ICD-10-CM | POA: Diagnosis not present

## 2017-04-05 DIAGNOSIS — G4730 Sleep apnea, unspecified: Secondary | ICD-10-CM | POA: Insufficient documentation

## 2017-04-05 DIAGNOSIS — Z79899 Other long term (current) drug therapy: Secondary | ICD-10-CM | POA: Insufficient documentation

## 2017-04-05 DIAGNOSIS — Z6834 Body mass index (BMI) 34.0-34.9, adult: Secondary | ICD-10-CM | POA: Diagnosis not present

## 2017-04-05 DIAGNOSIS — I252 Old myocardial infarction: Secondary | ICD-10-CM | POA: Insufficient documentation

## 2017-04-05 HISTORY — PX: EXTRACORPOREAL SHOCK WAVE LITHOTRIPSY: SHX1557

## 2017-04-05 IMAGING — CR DG ABDOMEN 1V
1 series · 3 of 3 positions shown · non-contrast
Comparison: CT abdomen and pelvis [DATE]

CLINICAL DATA: Left renal calculus.  Pre lithotripsy.

EXAM:
ABDOMEN - 1 VIEW

[Series 1: dg abd 1 view · 0.14mm/px · 3 of 3 slices shown]
[im 1/3]
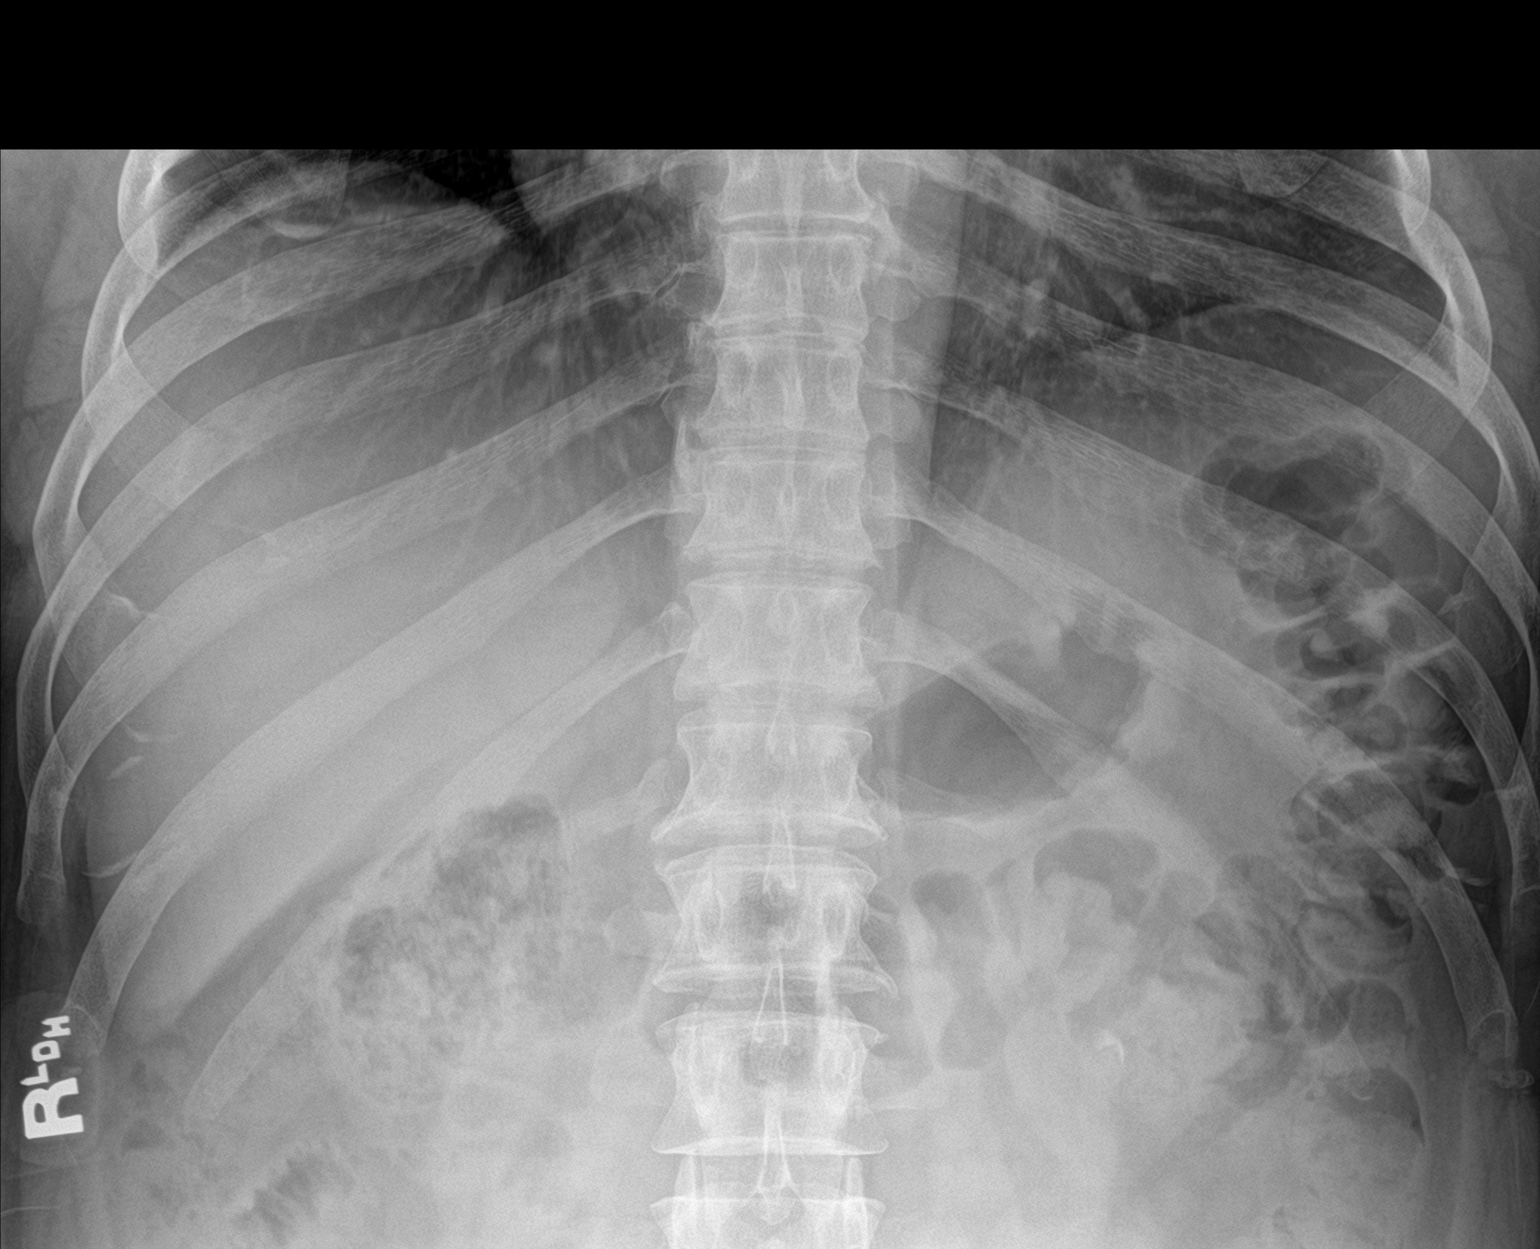
[im 2/3]
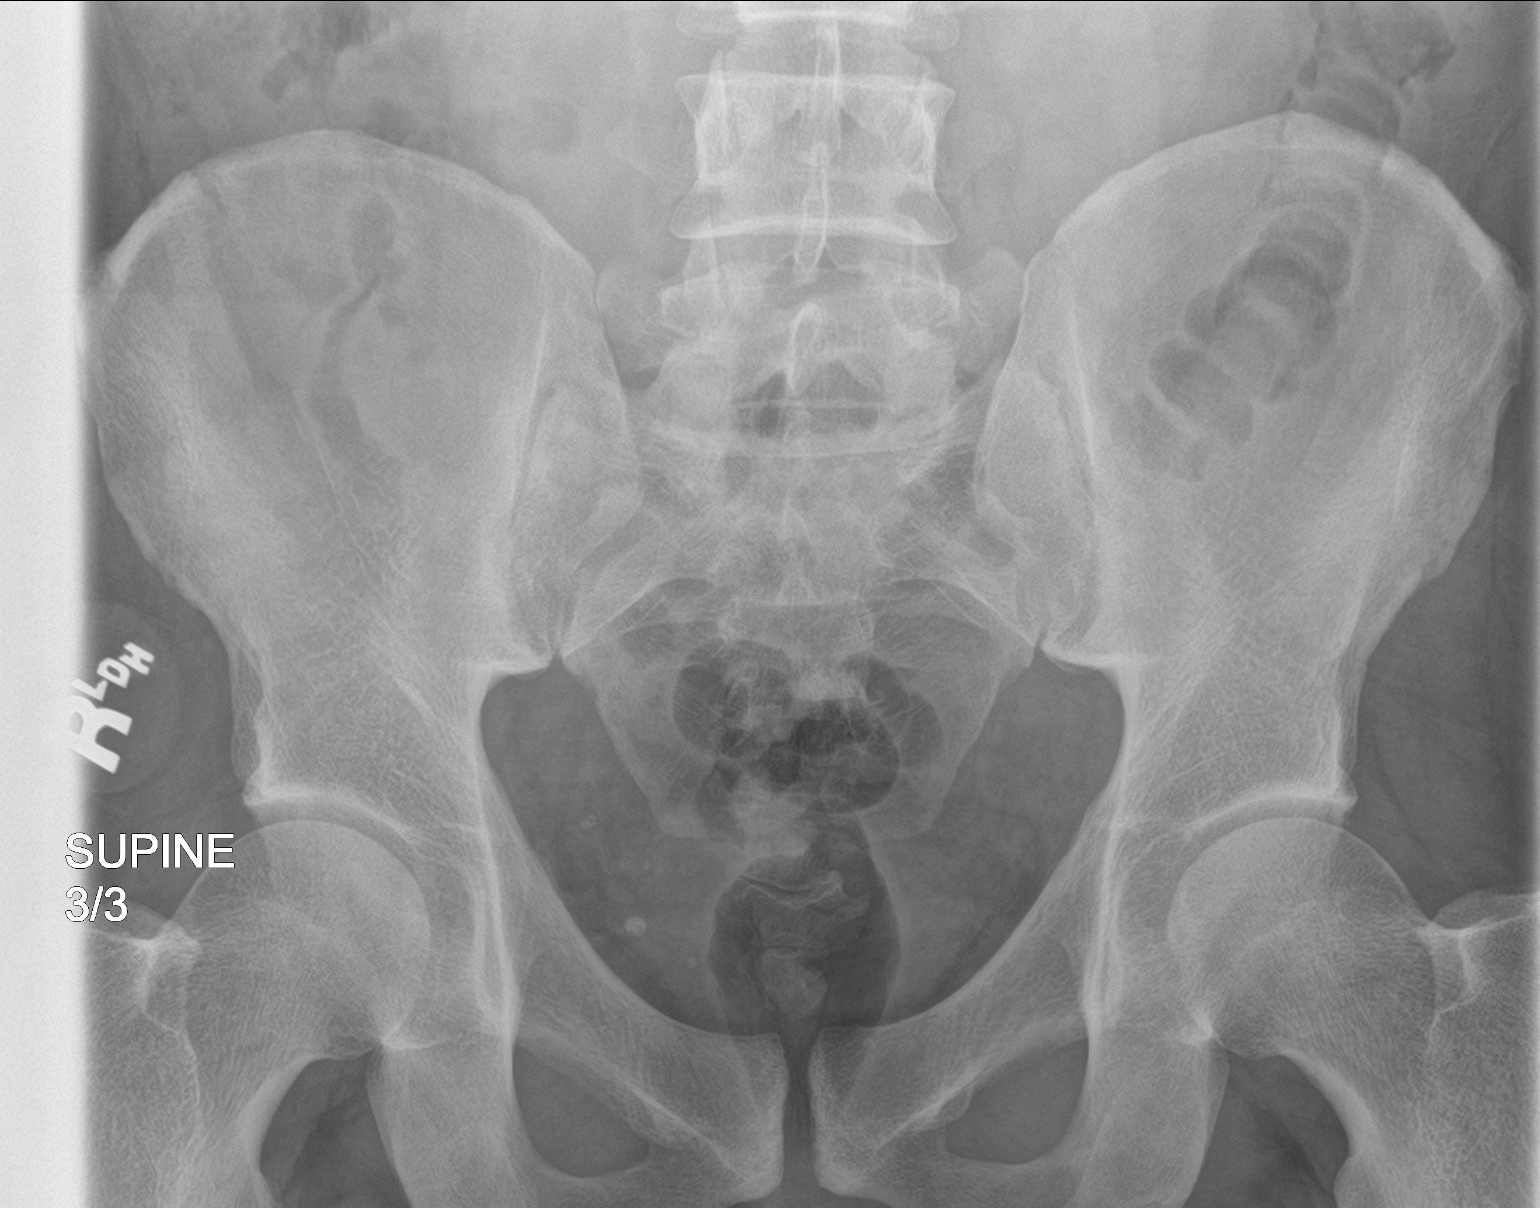
[im 3/3]
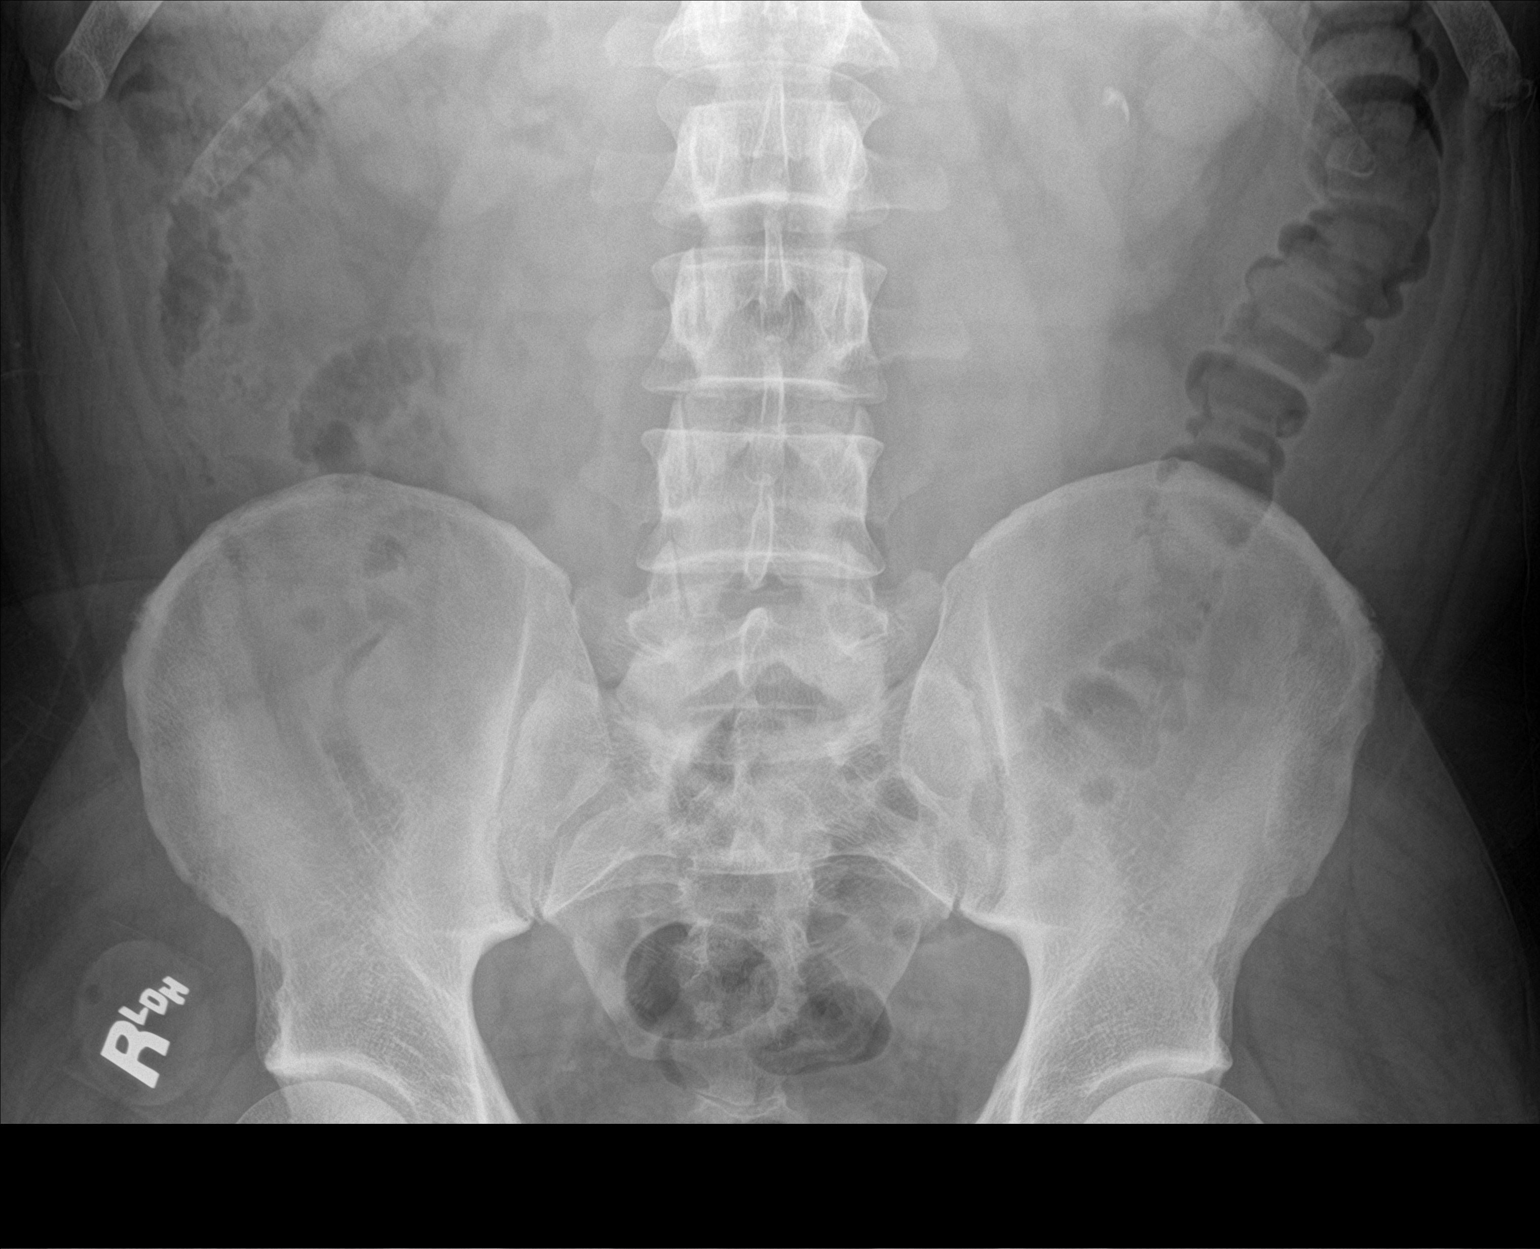

[3 of 3 positions shown; findings below may reference images not displayed]

FINDINGS: 11 x 5 mm focus of calcification projecting over the lower pole of
the left kidney corresponds to the 2 adjacent calculi described on
the prior CT, unchanged. Additional punctate calculi in both kidneys
on CT are not clearly demonstrated on these radiographs, with
assessment limited by overlying bowel. No definite calculi are
identified along the expected course of the ureters. Small
calcifications in the lower right pelvis correspond to phleboliths
on CT. There is a nonobstructed bowel gas pattern. The visualized
lung bases are grossly clear. No acute osseous abnormality is
identified.
IMPRESSION: Unchanged left lower pole renal calculi.

## 2017-04-05 SURGERY — LITHOTRIPSY, ESWL
Anesthesia: Moderate Sedation | Laterality: Left

## 2017-04-05 MED ORDER — CIPROFLOXACIN HCL 500 MG PO TABS
500.0000 mg | ORAL_TABLET | ORAL | Status: AC
  Administered 2017-04-05: 500 mg via ORAL

## 2017-04-05 MED ORDER — DIPHENHYDRAMINE HCL 25 MG PO CAPS
25.0000 mg | ORAL_CAPSULE | ORAL | Status: AC
  Administered 2017-04-05: 25 mg via ORAL

## 2017-04-05 MED ORDER — ONDANSETRON HCL 4 MG/2ML IJ SOLN
4.0000 mg | Freq: Once | INTRAMUSCULAR | Status: AC | PRN
  Administered 2017-04-05: 4 mg via INTRAVENOUS

## 2017-04-05 MED ORDER — TAMSULOSIN HCL 0.4 MG PO CAPS: 0.4000 mg | ORAL_CAPSULE | Freq: Every day | ORAL | 0 refills | Status: DC

## 2017-04-05 MED ORDER — DOCUSATE SODIUM 100 MG PO CAPS: 100.0000 mg | ORAL_CAPSULE | Freq: Two times a day (BID) | ORAL | 0 refills | Status: DC

## 2017-04-05 MED ORDER — CIPROFLOXACIN HCL 500 MG PO TABS
ORAL_TABLET | ORAL | Status: AC
  Administered 2017-04-05: 500 mg via ORAL
  Filled 2017-04-05: qty 1

## 2017-04-05 MED ORDER — SODIUM CHLORIDE 0.9 % IV SOLN: INTRAVENOUS | Status: DC

## 2017-04-05 MED ORDER — HYDROCODONE-ACETAMINOPHEN 5-325 MG PO TABS: 1.0000 | ORAL_TABLET | Freq: Four times a day (QID) | ORAL | 0 refills | Status: DC | PRN

## 2017-04-05 MED ORDER — HYDROCODONE-ACETAMINOPHEN 5-325 MG PO TABS
ORAL_TABLET | ORAL | Status: AC
  Filled 2017-04-05: qty 1

## 2017-04-05 MED ORDER — DIAZEPAM 5 MG PO TABS
ORAL_TABLET | ORAL | Status: AC
  Administered 2017-04-05: 10 mg via ORAL
  Filled 2017-04-05: qty 2

## 2017-04-05 MED ORDER — DIPHENHYDRAMINE HCL 25 MG PO CAPS
ORAL_CAPSULE | ORAL | Status: AC
  Administered 2017-04-05: 25 mg via ORAL
  Filled 2017-04-05: qty 1

## 2017-04-05 MED ORDER — HYDROCODONE-ACETAMINOPHEN 5-325 MG PO TABS
1.0000 | ORAL_TABLET | Freq: Four times a day (QID) | ORAL | Status: DC | PRN
  Administered 2017-04-05: 1 via ORAL

## 2017-04-05 MED ORDER — DIAZEPAM 5 MG PO TABS
10.0000 mg | ORAL_TABLET | ORAL | Status: AC
  Administered 2017-04-05: 10 mg via ORAL

## 2017-04-05 MED ORDER — ONDANSETRON HCL 4 MG/2ML IJ SOLN
INTRAMUSCULAR | Status: AC
  Administered 2017-04-05: 4 mg via INTRAVENOUS
  Filled 2017-04-05: qty 2

## 2017-04-05 NOTE — Interval H&P Note (Signed)
History and Physical Interval Note:  04/05/2017 9:24 AM  Melvin Hatfield  has presented today for surgery, with the diagnosis of Kidney stone  The various methods of treatment have been discussed with the patient and family. After consideration of risks, benefits and other options for treatment, the patient has consented to  Procedure(s): EXTRACORPOREAL SHOCK WAVE LITHOTRIPSY (ESWL) (Left) as a surgical intervention .  The patient's history has been reviewed, patient examined, no change in status, stable for surgery.  I have reviewed the patient's chart and labs.  Questions were answered to the patient's satisfaction.     Vanna Scotland

## 2017-04-05 NOTE — Discharge Instructions (Signed)
See [facility] discharge instructions in chart.  

## 2017-04-16 ENCOUNTER — Encounter: Payer: Self-pay | Admitting: Urology

## 2017-04-16 ENCOUNTER — Ambulatory Visit (INDEPENDENT_AMBULATORY_CARE_PROVIDER_SITE_OTHER): Payer: BC Managed Care – PPO | Admitting: Urology

## 2017-04-16 ENCOUNTER — Ambulatory Visit
Admission: RE | Admit: 2017-04-16 | Discharge: 2017-04-16 | Disposition: A | Discharge status: Home / Self Care | Payer: BC Managed Care – PPO | Source: Ambulatory Visit | Attending: Urology | Admitting: Urology

## 2017-04-16 VITALS — BP 127/79 | HR 45 | Ht 70.0 in | Wt 242.2 lb

## 2017-04-16 DIAGNOSIS — Z87448 Personal history of other diseases of urinary system: Secondary | ICD-10-CM

## 2017-04-16 DIAGNOSIS — N200 Calculus of kidney: Secondary | ICD-10-CM | POA: Diagnosis not present

## 2017-04-16 IMAGING — CR DG ABDOMEN 1V
1 series · 2 of 2 positions shown · non-contrast
Comparison: KUB [DATE]

CLINICAL DATA: Follow-up left-sided lithotripsy from [DATE]. No current symptoms.

EXAM:
ABDOMEN - 1 VIEW

[Series 1: dg abd 1 view · 0.14mm/px · 2 of 2 slices shown]
[im 1/2]
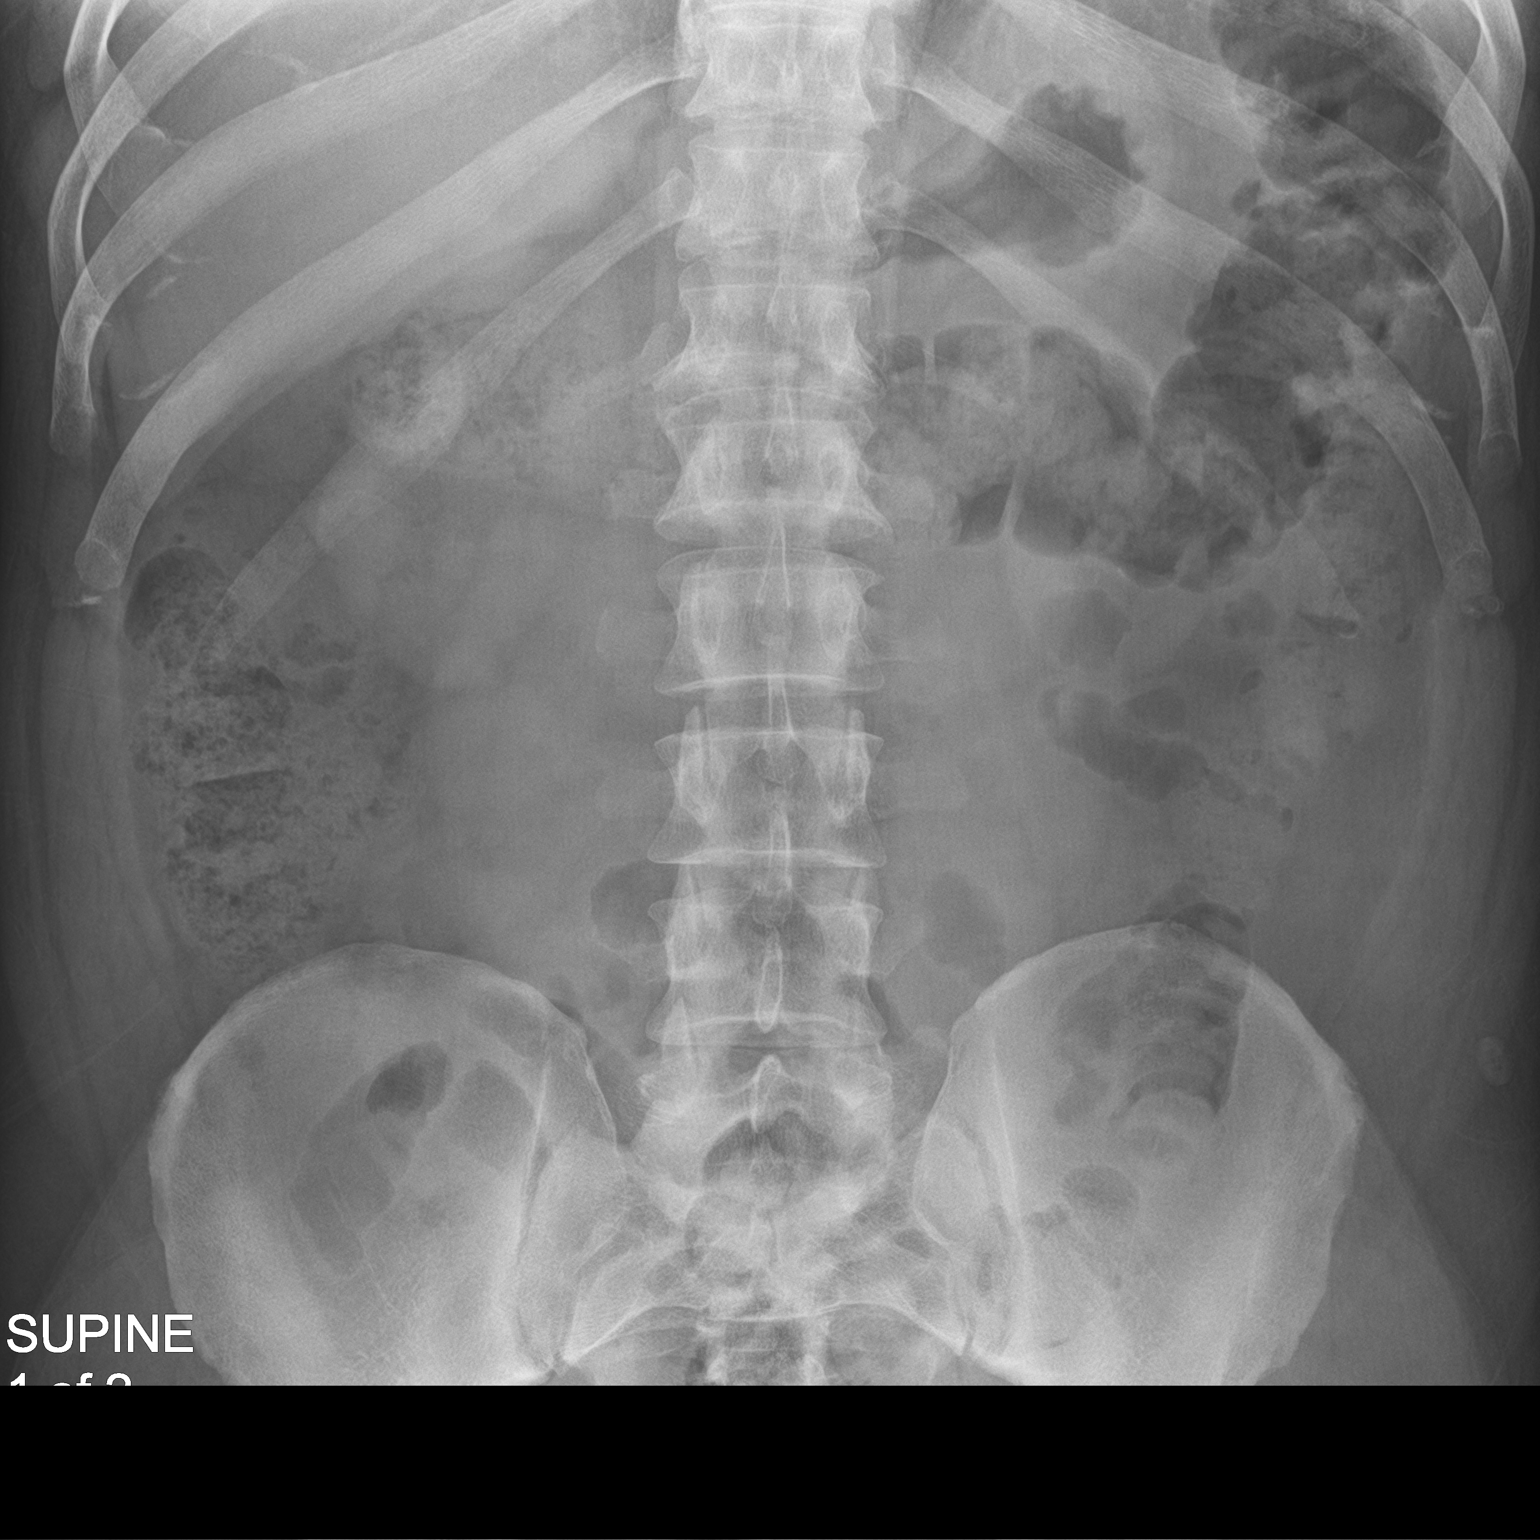
[im 2/2]
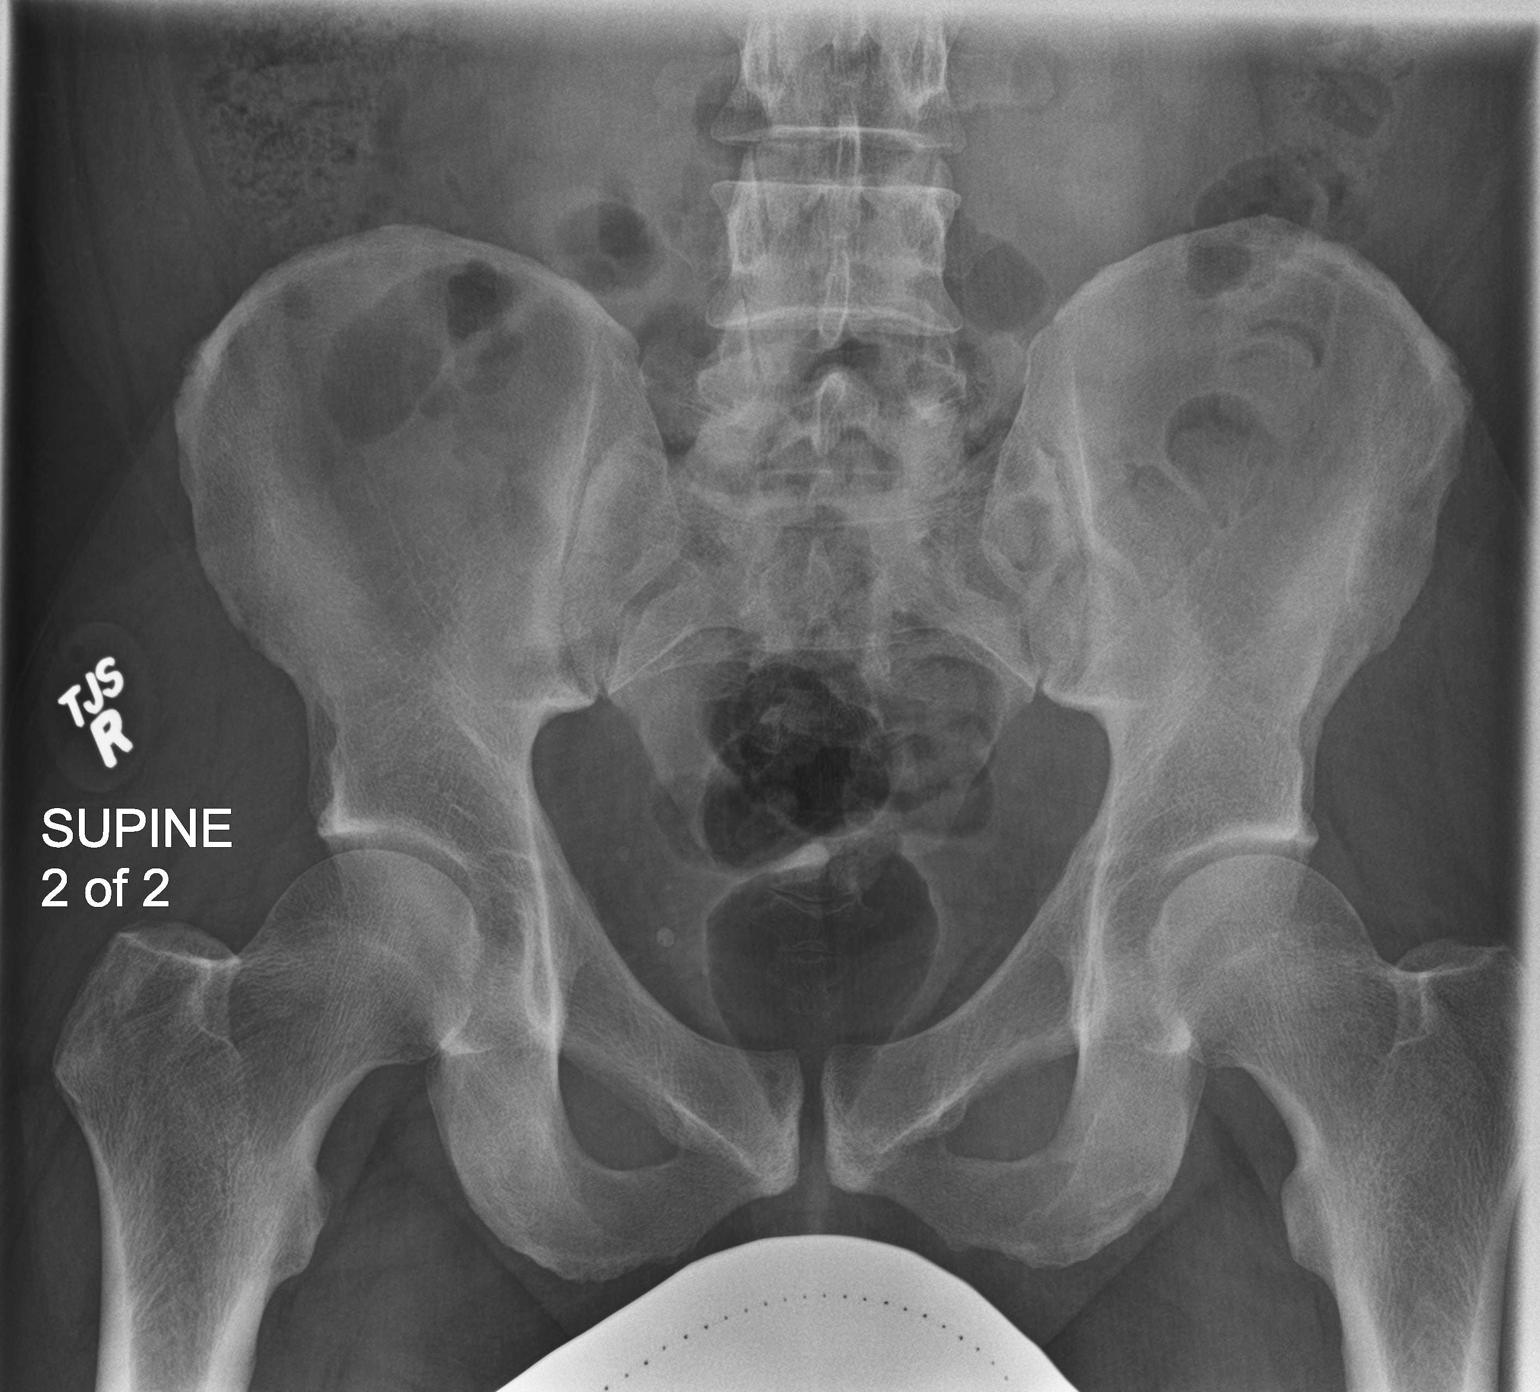

[2 of 2 positions shown; findings below may reference images not displayed]

FINDINGS: There are punctate calcifications which project over the lower pole
of the left kidney. The previously demonstrated larger stone is not
evident. Along the course of the left ureter no stones are
demonstrated. On the right no kidney or ureteral stones are
observed. There are stable phleboliths within the pelvis.
IMPRESSION: There are punctate stone fragments remaining in the lower pole of
the left kidney. No evidence of stones elsewhere.

## 2017-04-16 NOTE — Progress Notes (Signed)
11:30 AM   Domingo Madeira [birth year] [id number]  Referring provider: Kerman Passey, MD [address] [place], [place] [address]  No chief complaint on file.   HPI: 47 yo WM with BPH w LU TS, nephrolithiasis and a history of hematuria who presents today for a 2 week follow up after ESWL.  Background history 47 year old WM with BPH with LU TS, nephrolithiasis and a history of hematuria who presents today to discuss definitive management for his stones.  He states he has not had any flank pain or gross hematuria.  He has heard "horror stories" regarding the passage of a large stone and would like his stone treated prior to it passing on its own.  Patient denies any gross hematuria, dysuria or suprapubic/flank pain.  Patient denies any fevers, chills, nausea or vomiting.  His most recent CT was performed in 03/2015 and it noted bilateral renal calculi likely accounting for the patient's microhematuria. No obstructing ureteral calculi or bladder calculi.  Lower pole left renal cyst. No worrisome renal lesions and no bladder abnormality.  No other significant abdominal/pelvic findings.  Patient's KUB taken on 11/13/2016 noted a left lower pole 11 mm nephrolithiasis which is stable.  The previous punctate right lower pole stone was unable to be visualized.    CT Renal stone study performed on 03/21/2017 noted bilateral nonobstructing renal calculi, measuring up to 5 mm in the left lower pole, as above. No ureteral or bladder calculi. No hydronephrosis.  15 mm left lower pole renal cyst, benign (Bosniak I).   KUB taken on 04/05/2017 noted an unchanged left lower pole renal calculi.    He underwent left ESWL on 04/05/2017 with Dr. Apolinar Junes.  The procedure was uneventful and as expected.  He states that Thursday was worse than he expected regarding pain.  He has been pain free since Saturday.  He continued to have intermittent gross hematuria until Monday.  KUB taken today (04/16/2017) noted  punctate stone fragments remaining in the lower pole of the left kidney. No evidence of stones elsewhere.  He brings in fragments for analysis at this time.   History of hematuria Incidental on UA. No gross hematuria or flank pain.  CT urogram on 04/12/2015 reviewed personally. This reveals bilateral small nonobstructing calculi. He does also have a known left lower pole renal cyst which was previously incompletely characterized, but CT urogram shows definitively that this is not a worrisome or enhancing lesion rather benign cyst.  Cystoscopy performed on 04/23/2015 with Dr. Apolinar Junes noted mild trabeculation of the bladder with a slightly enlarged prostate.     PMH: Past Medical History:  Diagnosis Date  . Allergy   . Arthritis   . Coronary artery disease involving native coronary artery without angina pectoris   . Depression   . GERD (gastroesophageal reflux disease)   . Hyperlipidemia   . Left nephrolithiasis 08/17/2014  . Major depressive disorder, recurrent episode, in partial remission (HCC) 07/09/2014   Doing well on current dosage of Sertraline 100mg .   . Myocardial infarction (HCC)   . Sleep apnea     Surgical History: Past Surgical History:  Procedure Laterality Date  . CORONARY ANGIOPLASTY WITH STENT PLACEMENT    . EXTRACORPOREAL SHOCK WAVE LITHOTRIPSY Left 04/05/2017   Procedure: EXTRACORPOREAL SHOCK WAVE LITHOTRIPSY (ESWL);  Surgeon: Vanna Scotland, MD;  Location: [facility] ORS;  Service: Urology;  Laterality: Left;  . reconstructed ear drum    . SPINE SURGERY    . TYMPANOSTOMY TUBE  PLACEMENT     patient states he has had 6    Home Medications:  Allergies as of 04/16/2017   No Known Allergies     Medication List        Accurate as of 04/16/17 11:30 AM. Always use your most recent med list.          aspirin 81 MG chewable tablet Chew 81 mg by mouth.   BRILINTA 90 MG Tabs tablet Generic drug:  ticagrelor Take 90 mg by mouth.   CRESTOR 5 MG tablet Generic drug:   rosuvastatin Take 1 tablet (5 mg total) by mouth at bedtime.   docusate sodium 100 MG capsule Commonly known as:  COLACE Take 1 capsule (100 mg total) by mouth 2 (two) times daily.   HYDROcodone-acetaminophen 5-325 MG tablet Commonly known as:  NORCO/VICODIN Take 1-2 tablets by mouth every 6 (six) hours as needed for moderate pain.   omeprazole 20 MG capsule Commonly known as:  PRILOSEC Take 1 capsule (20 mg total) by mouth daily.   sertraline 100 MG tablet Commonly known as:  ZOLOFT Take 1 tablet (100 mg total) by mouth daily.   tamsulosin 0.4 MG Caps capsule Commonly known as:  FLOMAX Take 1 capsule (0.4 mg total) by mouth daily.       Allergies: No Known Allergies  Family History: Family History  Problem Relation Age of Onset  . Cancer Mother   . Depression Mother   . Stroke Maternal Grandmother   . Stroke Maternal Grandfather   . Kidney disease Neg Hx   . Prostate cancer Neg Hx     Social History:  reports that he has never smoked. He has never used smokeless tobacco. He reports that he does not drink alcohol or use drugs.  ROS: UROLOGY Frequent Urination?: No Hard to postpone urination?: No Burning/pain with urination?: No Get up at night to urinate?: No Leakage of urine?: No Urine stream starts and stops?: No Trouble starting stream?: No Do you have to strain to urinate?: No Urinary tract infection?: No Sexually transmitted disease?: No Injury to kidneys or bladder?: No Painful intercourse?: No Weak stream?: No Erection problems?: No Penile pain?: No Gastrointestinal Nausea?: No Vomiting?: No Indigestion/heartburn?: No Diarrhea?: No Constipation?: No Constitutional Fever: No Night sweats?: No Weight loss?: No Fatigue?: No Skin Skin rash/lesions?: No Itching?: No Eyes Blurred vision?: No Double vision?: No Ears/Nose/Throat Sore throat?: No Sinus problems?: No Hematologic/Lymphatic Swollen glands?: No Easy bruising?:  No Cardiovascular Leg swelling?: No Chest pain?: No Respiratory Cough?: No Shortness of breath?: No Endocrine Excessive thirst?: No Musculoskeletal Back pain?: No Joint pain?: No Neurological Headaches?: No Dizziness?: No Psychologic Depression?: Yes Anxiety?: No   Physical Exam: Blood pressure 127/79, pulse (!) 45, height 5\' 10"  (1.778 m), weight 242 lb 3.2 oz (109.9 kg). Constitutional: Well nourished. Alert and oriented, No acute distress. HEENT: [place] AT, moist mucus membranes. Trachea midline, no masses. Cardiovascular: No clubbing, cyanosis, or edema. Respiratory: Normal respiratory effort, no increased work of breathing. Skin: No rashes, bruises or suspicious lesions. Lymph: No cervical or inguinal adenopathy. Neurologic: Grossly intact, no focal deficits, moving all 4 extremities. Psychiatric: Normal mood and affect.     Laboratory Data: PSA 0.7 ng/mL on 10/07/2015 PSA 0.9 ng/mL on 11/06/2016  I have reviewed the labs.  Pertinent Imaging CLINICAL DATA:  Kidney stones  EXAM: CT ABDOMEN AND PELVIS WITHOUT CONTRAST  TECHNIQUE: Multidetector CT imaging of the abdomen and pelvis was performed following the standard protocol without IV contrast.  COMPARISON:  04/14/2015  FINDINGS: Lower chest: Lung bases are clear.  Hepatobiliary: 12 mm cyst in segment 2 (series 2/image 16).  Gallbladder is unremarkable. No intrahepatic or extrahepatic ductal dilatation.  Pancreas: Within normal limits.  Spleen: Within normal limits.  Adrenals/Urinary Tract: Adrenal glands within normal limits.  4 mm nonobstructing right lower pole renal calculus (series 2/image 41). Additional punctate calculus in the upper pole (coronal image 104).  Two left lower pole renal calculi measuring up to 5 mm (series 2/image 42). Additional punctate calculus in the upper pole (coronal image 94). 15 mm lateral left lower pole renal cyst (series 2/image 42), better  evaluated on prior enhanced CT, simple.  No ureteral or bladder calculi.  No hydronephrosis.  Bladder is within normal limits.  Stomach/Bowel: Stomach is within normal limits.  No evidence of bowel obstruction.  Normal appendix (series 2/image 53).  Vascular/Lymphatic: No evidence of abdominal aortic aneurysm.  No suspicious abdominopelvic lymphadenopathy.  Reproductive: Prostate is unremarkable.  Other: No abdominopelvic ascites.  Musculoskeletal: Visualized osseous structures are within normal limits.  IMPRESSION: Bilateral nonobstructing renal calculi, measuring up to 5 mm in the left lower pole, as above. No ureteral or bladder calculi. No hydronephrosis.  15 mm left lower pole renal cyst, benign (Bosniak I).   Electronically Signed   By: Charline Bills M.D.   On: 03/21/2017 15:08  CLINICAL DATA:  Left renal calculus.  Pre lithotripsy.  EXAM: ABDOMEN - 1 VIEW  COMPARISON:  CT abdomen and pelvis 03/21/2017  FINDINGS: 11 x 5 mm focus of calcification projecting over the lower pole of the left kidney corresponds to the 2 adjacent calculi described on the prior CT, unchanged. Additional punctate calculi in both kidneys on CT are not clearly demonstrated on these radiographs, with assessment limited by overlying bowel. No definite calculi are identified along the expected course of the ureters. Small calcifications in the lower right pelvis correspond to phleboliths on CT. There is a nonobstructed bowel gas pattern. The visualized lung bases are grossly clear. No acute osseous abnormality is identified.  IMPRESSION: Unchanged left lower pole renal calculi.   Electronically Signed   By: Sebastian Ache M.D.   On: 04/05/2017 09:27  CLINICAL DATA:  Follow-up left-sided lithotripsy from April 05, 2017. No current symptoms.  EXAM: ABDOMEN - 1 VIEW  COMPARISON:  KUB of April 05, 2017  FINDINGS: There are punctate calcifications  which project over the lower pole of the left kidney. The previously demonstrated larger stone is not evident. Along the course of the left ureter no stones are demonstrated. On the right no kidney or ureteral stones are observed. There are stable phleboliths within the pelvis.  IMPRESSION: There are punctate stone fragments remaining in the lower pole of the left kidney. No evidence of stones elsewhere.   Electronically Signed   By: David  [place] M.D.   On: 04/16/2017 09:01  I have independently reviewed the films  Assessment & Plan:    1. Left lower renal pole stone  - KUB noted punctate stone fragments in the lower pole of the left kidney  - stone fragments sent for analysis  - will obtain a RUS to ensure no iatrogenic hydronephrosis  2. Bilateral nephrolithiasis  - ESWL completed for left renal stone   - no intervention warranted for the right renal stones at this time  - patient completed a 24 hour metabolic work up in 2016 and he readily admits that he did not make the changes suggested to him  3. History of hematuria  - completed a hematuria work up in 03/2015 - no worrisome findings  - RTC in one year for UA (10/2017)  - will report any gross hematuria   Return for I will call patient with results.  Khalie Wince, PA-C

## 2017-04-24 ENCOUNTER — Other Ambulatory Visit: Payer: Self-pay | Admitting: Urology

## 2017-04-24 ENCOUNTER — Encounter: Payer: Self-pay | Admitting: Family Medicine

## 2017-04-25 ENCOUNTER — Telehealth: Payer: Self-pay

## 2017-04-25 NOTE — Telephone Encounter (Signed)
Letter sent.

## 2017-04-25 NOTE — Telephone Encounter (Signed)
-----   Message from Harle Battiest, PA-C sent at 04/24/2017  2:55 PM EDT ----- Please let Mr. Melvin Hatfield know that his stone analysis demonstrated a 74% Calcium Oxalate Monohydrate, 23% Calcium Oxalate Dihydrate and 3% Calcium Phosphate Carbonate.  He needs to increase his water to 2.5 L daily, limit salt intake and protein intake.

## 2017-04-26 ENCOUNTER — Ambulatory Visit
Admission: RE | Admit: 2017-04-26 | Discharge: 2017-04-26 | Disposition: A | Discharge status: Home / Self Care | Payer: BC Managed Care – PPO | Source: Ambulatory Visit | Attending: Urology | Admitting: Urology

## 2017-04-26 DIAGNOSIS — N200 Calculus of kidney: Secondary | ICD-10-CM | POA: Diagnosis not present

## 2017-04-26 DIAGNOSIS — N281 Cyst of kidney, acquired: Secondary | ICD-10-CM | POA: Diagnosis not present

## 2017-04-27 ENCOUNTER — Other Ambulatory Visit: Payer: Self-pay | Admitting: Urology

## 2017-04-27 ENCOUNTER — Telehealth: Payer: Self-pay | Admitting: Urology

## 2017-04-27 DIAGNOSIS — N281 Cyst of kidney, acquired: Secondary | ICD-10-CM

## 2017-04-27 NOTE — Telephone Encounter (Signed)
Patient needs an order for a KUB for October   Thanks  [place]

## 2017-04-27 NOTE — Progress Notes (Signed)
RUS orders are in for next year.

## 2017-04-29 ENCOUNTER — Other Ambulatory Visit: Payer: Self-pay | Admitting: Urology

## 2017-04-29 DIAGNOSIS — Z87442 Personal history of urinary calculi: Secondary | ICD-10-CM

## 2017-04-30 ENCOUNTER — Telehealth: Payer: Self-pay

## 2017-04-30 DIAGNOSIS — N281 Cyst of kidney, acquired: Secondary | ICD-10-CM

## 2017-04-30 NOTE — Telephone Encounter (Signed)
-----   Message from Harle Battiest, PA-C sent at 04/27/2017 10:20 AM EDT ----- Please let Melvin Hatfield know that his kidneys are not swollen.  He does have a cyst in his left kidney that we need to monitor, so we will repeat the RUS in one year.

## 2017-04-30 NOTE — Telephone Encounter (Signed)
Letter sent.

## 2017-05-16 IMAGING — US US RENAL
1 series · 14 of 25 positions shown · non-contrast
Comparison: CT scan of [DATE].

CLINICAL DATA: Bilateral nephrolithiasis.

EXAM:
RENAL / URINARY TRACT ULTRASOUND COMPLETE

[Series 1: us renal · 0.26mm/px · 14 of 57 slices shown]
[im 1/57]
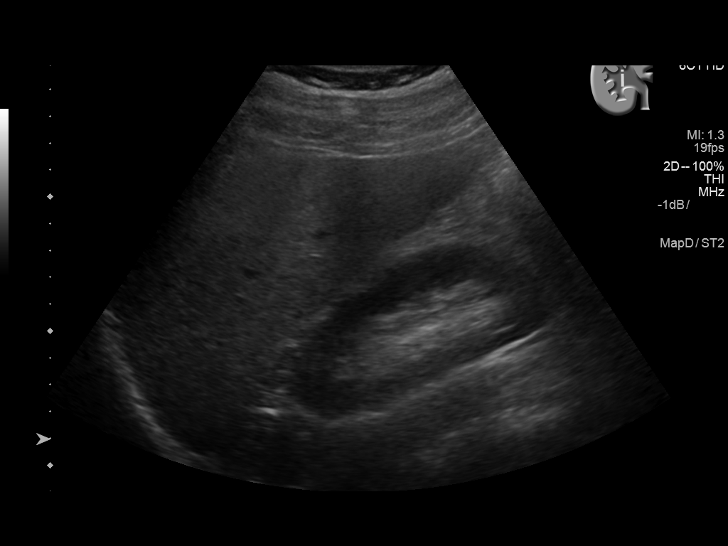
[im 5/57]
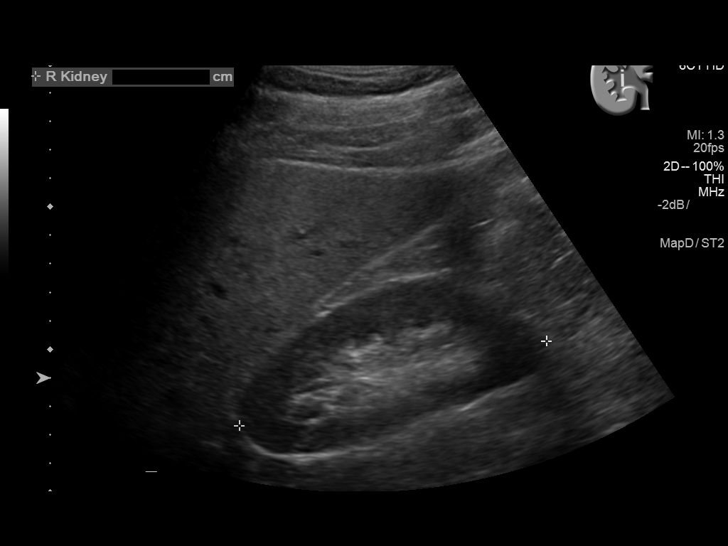
[im 10/57]
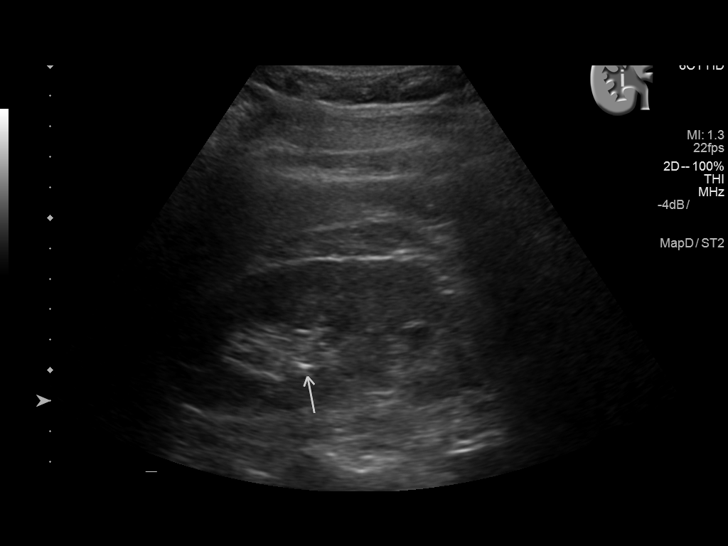
[im 15/57]
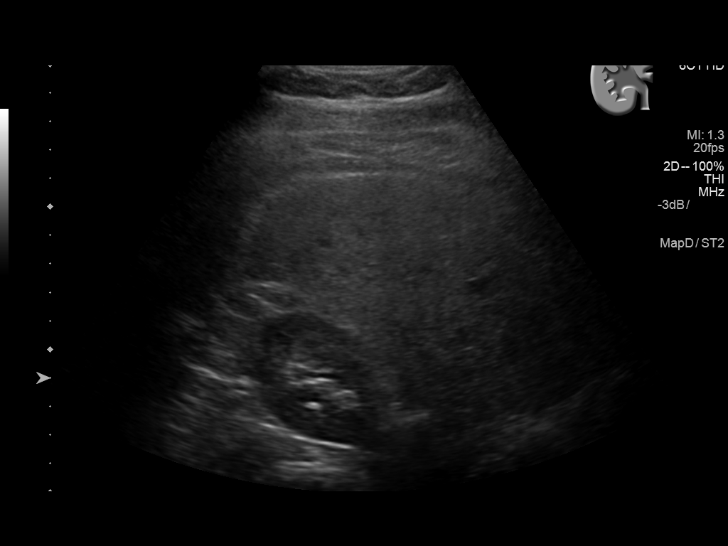
[im 19/57]
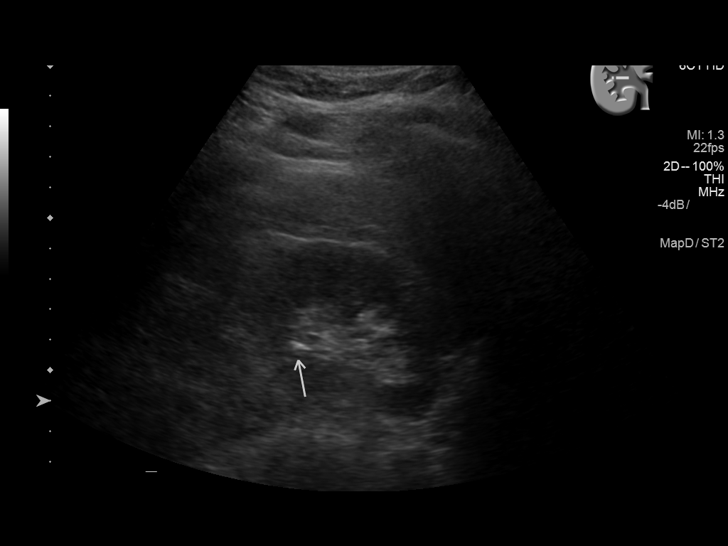
[im 22/57]
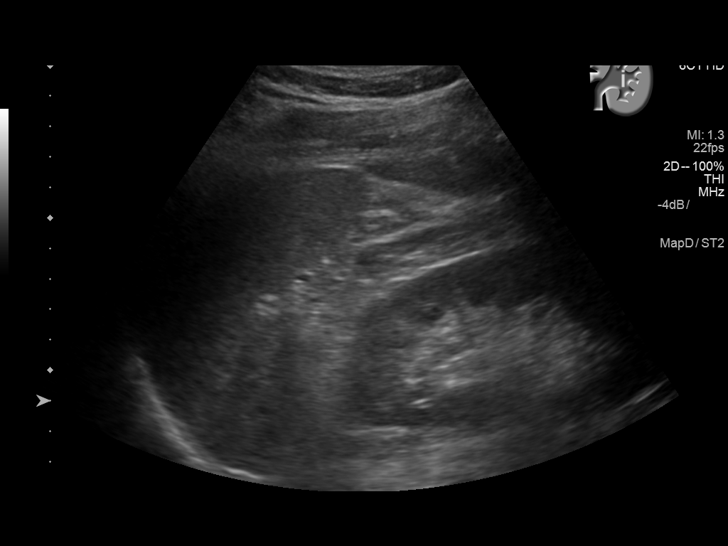
[im 26/57]
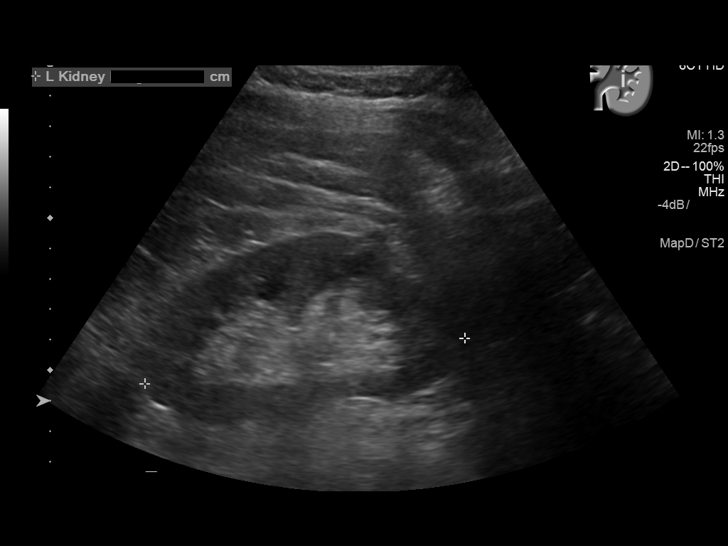
[im 31/57]
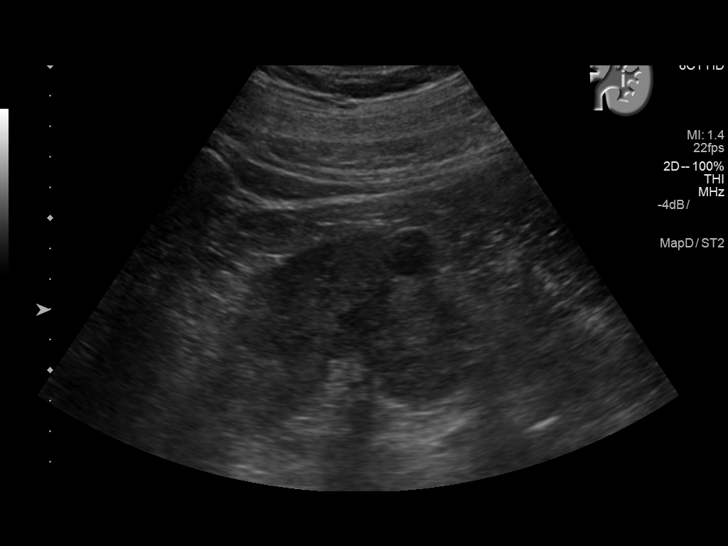
[im 36/57]
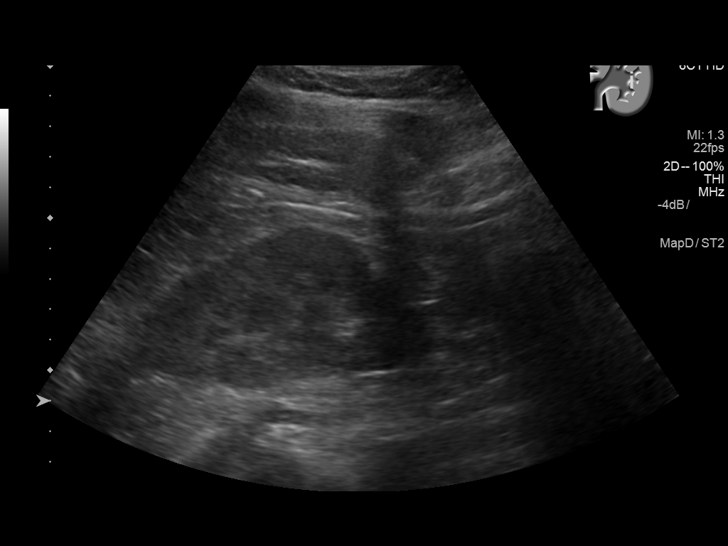
[im 38/57]
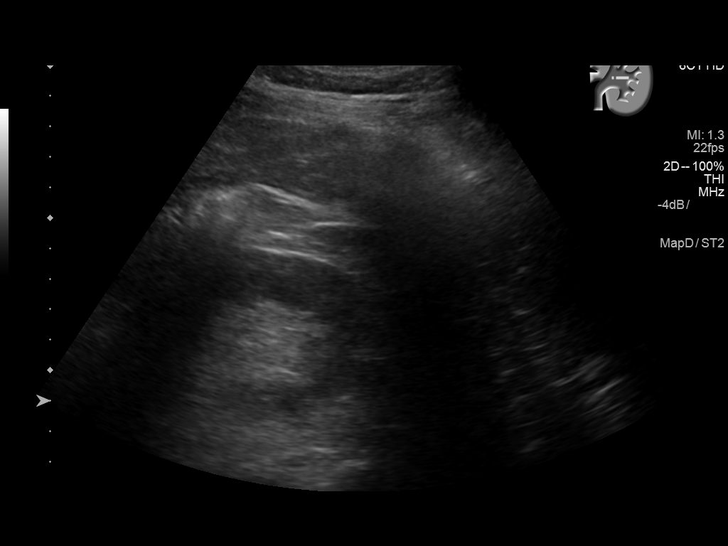
[im 43/57]
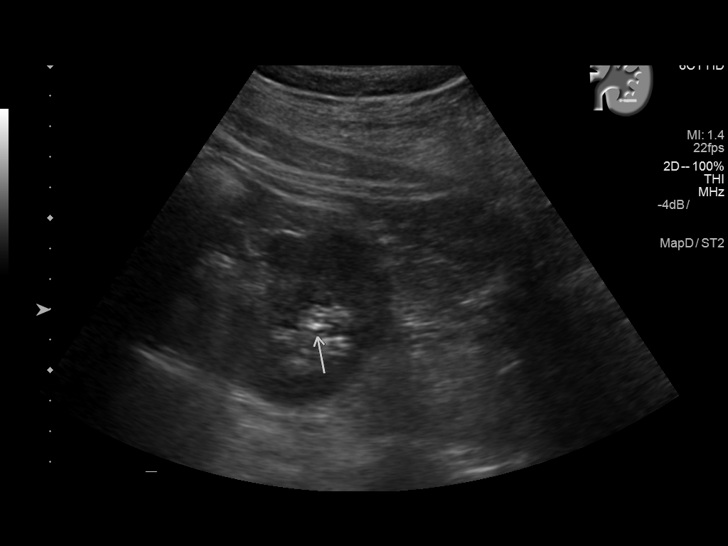
[im 47/57]
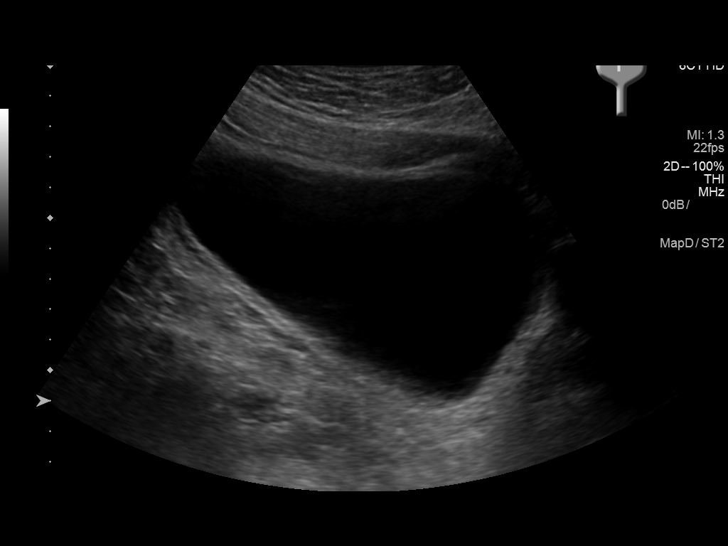
[im 52/57]
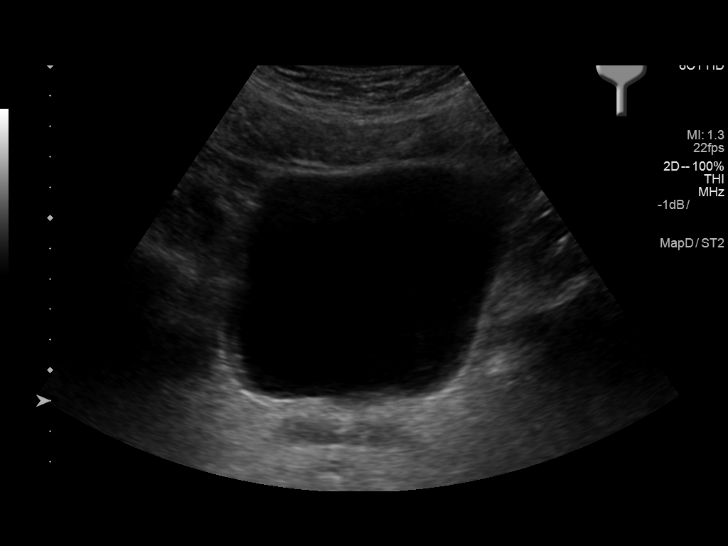
[im 57/57]
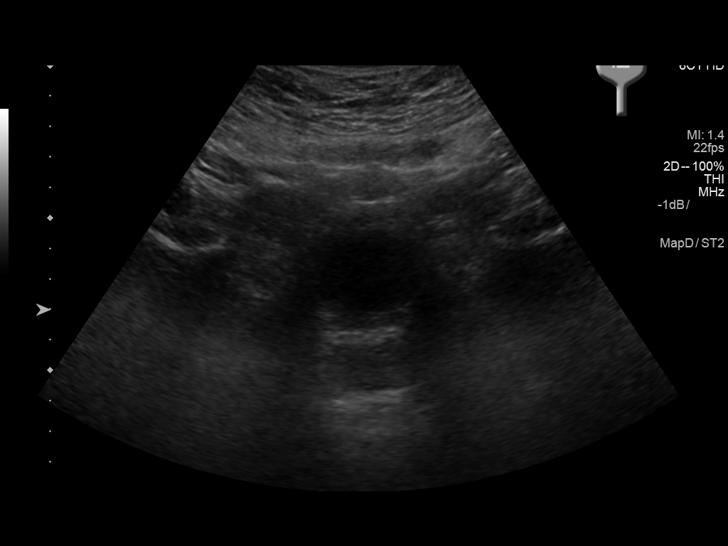

[14 of 25 positions shown; findings below may reference images not displayed]

FINDINGS: Right Kidney:

Length: 11.2 cm. Echogenic foci are noted in superior and middle
poles consistent with nephrolithiasis. Echogenicity within normal
limits. No mass or hydronephrosis visualized.

Left Kidney:

Length: 10.6 cm. Echogenicity within normal limits. No
hydronephrosis visualized. 16 mm septated cyst is seen in midpole
consistent with Bosniak type 2 lesion. Echogenic foci are noted in
lower pole consistent with nephrolithiasis.

Bladder:

Appears normal for degree of bladder distention. Bilateral ureteral
jets are noted.
IMPRESSION: Bilateral nephrolithiasis. No hydronephrosis or renal obstruction is
noted.

16 mm septated cyst is seen in midpole of left kidney consistent
with Bosniak type 2 lesion. Follow-up ultrasound in 12 months is
recommended to ensure stability.

## 2017-06-05 NOTE — Telephone Encounter (Signed)
Error

## 2017-09-28 ENCOUNTER — Encounter: Payer: Self-pay | Admitting: Family Medicine

## 2017-10-16 ENCOUNTER — Ambulatory Visit: Payer: BC Managed Care – PPO | Admitting: Family Medicine

## 2017-10-16 ENCOUNTER — Encounter: Payer: Self-pay | Admitting: Family Medicine

## 2017-10-16 VITALS — BP 112/62 | HR 55 | Temp 98.0°F | Ht 70.0 in | Wt 227.0 lb

## 2017-10-16 DIAGNOSIS — Z9989 Dependence on other enabling machines and devices: Secondary | ICD-10-CM

## 2017-10-16 DIAGNOSIS — G4733 Obstructive sleep apnea (adult) (pediatric): Secondary | ICD-10-CM | POA: Diagnosis not present

## 2017-10-16 DIAGNOSIS — F3342 Major depressive disorder, recurrent, in full remission: Secondary | ICD-10-CM | POA: Diagnosis not present

## 2017-10-16 DIAGNOSIS — E669 Obesity, unspecified: Secondary | ICD-10-CM

## 2017-10-16 DIAGNOSIS — G4730 Sleep apnea, unspecified: Secondary | ICD-10-CM | POA: Diagnosis not present

## 2017-10-16 DIAGNOSIS — I2510 Atherosclerotic heart disease of native coronary artery without angina pectoris: Secondary | ICD-10-CM

## 2017-10-16 DIAGNOSIS — F3341 Major depressive disorder, recurrent, in partial remission: Secondary | ICD-10-CM

## 2017-10-16 DIAGNOSIS — G4700 Insomnia, unspecified: Secondary | ICD-10-CM

## 2017-10-16 DIAGNOSIS — E66811 Obesity, class 1: Secondary | ICD-10-CM

## 2017-10-16 MED ORDER — SERTRALINE HCL 100 MG PO TABS: 100.0000 mg | ORAL_TABLET | Freq: Every day | ORAL | 3 refills | Status: DC

## 2017-10-16 NOTE — Assessment & Plan Note (Signed)
Long discussion about factors, sleep schedule, proper use of melatonin; recommended CBT-insomnia and I provided him with a list of counselors in the [place] area who can perform this; good sleep hygiene; avoid sedative hypnotics (because of his OSA)

## 2017-10-16 NOTE — Patient Instructions (Addendum)
Sleep Hygiene Tips 1) Get regular. One of the best ways to train your body to sleep well is to go to bed and get up at more or less the same time every day, even on weekends and days off! This regular rhythm will make you feel better and will give your body something to work from. 2) Sleep when sleepy. Only try to sleep when you actually feel tired or sleepy, rather than spending too much time awake in bed. 3) Get up & try again. If you haven't been able to get to sleep after about 20 minutes or more, get up and do something calming or boring until you feel sleepy, then return to bed and try again. Sit quietly on the couch with the lights off (bright light will tell your brain that it is time to wake up), or read something boring like the phone book. Avoid doing anything that is too stimulating or interesting, as this will wake you up even more. 4) Avoid caffeine & nicotine. It is best to avoid consuming any caffeine (in coffee, tea, cola drinks, chocolate, and some medications) or nicotine (cigarettes) for at least 4-6 hours before going to bed. These substances act as stimulants and interfere with the ability to fall asleep 5) Avoid alcohol. It is also best to avoid alcohol for at least 4-6 hours before going to bed. Many people believe that alcohol is relaxing and helps them to get to sleep at first, but it actually interrupts the quality of sleep. 6) Bed is for sleeping. Try not to use your bed for anything other than sleeping and sex, so that your body comes to associate bed with sleep. If you use bed as a place to watch TV, eat, read, work on your laptop, pay bills, and other things, your body will not learn this Connection. 7) No naps. It is best to avoid taking naps during the day, to make sure that you are tired at bedtime. If you can't make it through the day without a nap, make sure it is for less than an hour and before 3pm. 8) Sleep rituals. You can develop your  own rituals of things to remind your body that it is time to sleep - some people find it useful to do relaxing stretches or breathing exercises for 15 minutes before bed each night, or sit calmly with a cup of caffeine-free tea. 9) Bathtime. Having a hot bath 1-2 hours before bedtime can be useful, as it will raise your body temperature, causing you to feel sleepy as your body temperature drops again. Research shows that sleepiness is associated with a drop in body temperature. 10) No clock-watching. Many people who struggle with sleep tend to watch the clock too much. Frequently checking the clock during the night can wake you up (especially if you turn on the light to read the time) and reinforces negative thoughts such as "Oh no, look how late it is, I'll never get to sleep" or "it's so early, I have only slept for 5 hours, this is terrible." 11) Use a sleep diary. This worksheet can be a useful way of making sure you have the right facts about your sleep, rather than making assumptions. Because a diary involves watching the clock (see point 10) it is a good idea to only use it for two weeks to get an idea of what is going and then perhaps two months down the track to see how you are progressing. 12) Exercise. Regular exercise is  a good idea to help with good sleep, but try not to do strenuous exercise in the 4 hours before bedtime. Morning walks are a great way to start the day feeling refreshed! 13) Eat right. A healthy, balanced diet will help you to sleep well, but timing is important. Some people find that a very empty stomach at bedtime is distracting, so it can be useful to have a light snack, but a heavy meal soon before bed can also interrupt sleep. Some people recommend a warm glass of milk, which contains tryptophan, which acts as a natural sleep inducer. 14) The right space. It is very important that your bed and bedroom are quiet and comfortable for sleeping. A  cooler room with enough blankets to stay warm is best, and make sure you have curtains or an eyemask to block out early morning light and earplugs if there is noise outside your room. 15) Keep daytime routine the same. Even if you have a bad night sleep and are tired it is important that you try to keep your daytime activities the same as you had planned. That is, don't avoid activities because you feel tired. This can reinforce the insomnia.  Think about what you can control in your schedule and adjust or make changes as necessary  Think about surgical possibilities which would be with an Ear Nose Throat doctor I've put in a referral for that  Keep up the great job with your weight loss efforts Check out the information at familydoctor.org entitled "Nutrition for Weight Loss: What You Need to Know about Fad Diets" Try to lose between 1-2 pounds per week by taking in fewer calories and burning off more calories You can succeed by limiting portions, limiting foods dense in calories and fat, becoming more active, and drinking 8 glasses of water a day (64 ounces) Don't skip meals, especially breakfast, as skipping meals may alter your metabolism Do not use over-the-counter weight loss pills or gimmicks that claim rapid weight loss A healthy BMI (or body mass index) is between 18.5 and 24.9 You can calculate your ideal BMI at the [employer] website [employer]

## 2017-10-16 NOTE — Assessment & Plan Note (Signed)
Will f/u with cardiologist; I will defer the prescriptions of the statin to cards since they are checking his lipids

## 2017-10-16 NOTE — Assessment & Plan Note (Signed)
Discussed possibility of surgery for his narrow posterior OP; refer to ENT for discussion; because of his sleep apnea, I will not prescribe a sedative hypnotic for his insomnia

## 2017-10-16 NOTE — Progress Notes (Signed)
BP 112/62   Pulse (!) 55   Temp 98 F (36.7 C) (Oral)   Ht 5\' 10"  (1.778 m)   Wt 227 lb (103 kg)   SpO2 99%   BMI 32.57 kg/m    Subjective:    Patient ID: Melvin Hatfield, male    DOB: November 06, 1970, 47 y.o.   MRN: [id number]  HPI: Melvin Hatfield is a 47 y.o. male  Chief Complaint  Patient presents with  . Medication Refill  . Insomnia    HPI Patient is here for insomnia.  States worked night shift for several years, hasn't been on it for 7 years, but has been working more nights on the weekends and is having trouble sleeping. No issues staying asleep but takes about 3 hours for him to get to sleep.  This issue happened 4-5 nights a sleep. Gets about 5 hours of sleep at night. Wakes up groggy. No naps during the day but feels close to falling asleep at work.  Has tried reading, not using electronics late at night, not eating late, staying awake from caffeine.   States reading is helping a little bit. Had tried melatonin in the past and did not work for him then. 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 He is currently in the process of losing weight  He is losing weight with nutrisystem; 17.2 pounds over 3 weeks; he is happy he has lost that much weight  High cholesterol; he is taking crestor; he has a cardiology appt and they are going to check lipids and will refill; he has CAD and is status post coronary artery stent  Lab Results  Component Value Date   CHOL 215 (H) 08/17/2016   HDL 45 08/17/2016   LDLCALC 122 (H) 08/17/2016   TRIG 239 (H) 08/17/2016   CHOLHDL 4.8 08/17/2016   Mood is doing  well; he would like refills of his medicine sent to his pharmacy; points are really from his sleep issue we disucssed  Depression screen [facility] 2/9 10/16/2017 08/17/2016 01/20/2016 01/12/2015 07/09/2014  Decreased Interest 0 0 0 0 1  Down, Depressed, Hopeless 0 0 0 0 1  PHQ - 2 Score 0 0 0 0 2  Altered sleeping 3 - - - 2  Tired, decreased energy 2 - - - 3  Change in appetite 0 - - - 0  Feeling bad or failure about yourself  0 - - - 2  Trouble concentrating 1 - - - 2  Moving slowly or fidgety/restless 0 - - - 0  Suicidal thoughts 0 - - - 0  PHQ-9 Score 6 - - - 11  Difficult doing work/chores Somewhat difficult - - - Somewhat difficult   Fall Risk  10/16/2017 08/17/2016 01/20/2016 01/12/2015  Falls in the past year? No No No No    Relevant past medical, surgical, family and social history reviewed Past Medical History:  Diagnosis Date  . Allergy   . Arthritis   . Coronary artery disease involving native coronary artery without angina pectoris   .  Depression   . GERD (gastroesophageal reflux disease)   . Hyperlipidemia   . Left nephrolithiasis 08/17/2014  . Major depressive disorder, recurrent episode, in partial remission (HCC) 07/09/2014   Doing well on current dosage of Sertraline 100mg .   . Myocardial infarction (HCC)   . Sleep apnea    Past Surgical History:  Procedure Laterality Date  . CORONARY ANGIOPLASTY WITH STENT PLACEMENT    . EXTRACORPOREAL SHOCK WAVE LITHOTRIPSY Left 04/05/2017   Procedure: EXTRACORPOREAL SHOCK WAVE LITHOTRIPSY (ESWL);  Surgeon: Vanna Scotland, MD;  Location: [facility] ORS;  Service: Urology;  Laterality: Left;  . reconstructed ear drum    . SPINE SURGERY    . TYMPANOSTOMY TUBE PLACEMENT     patient states he has had 6   Family History  Problem Relation Age of Onset  . Cancer Mother   . Depression Mother   . Stroke Maternal Grandmother   . Stroke Maternal Grandfather   . Kidney disease Neg Hx   . Prostate cancer Neg Hx    Social History   Tobacco  Use  . Smoking status: Never Smoker  . Smokeless tobacco: Never Used  Substance Use Topics  . Alcohol use: No    Alcohol/week: 0.0 standard drinks  . Drug use: No     Office Visit from 10/16/2017 in [facility]  AUDIT-C Score  1      Interim medical history since last visit reviewed. Allergies and medications reviewed  Review of Systems Per HPI unless specifically indicated above     Objective:    BP 112/62   Pulse (!) 55   Temp 98 F (36.7 C) (Oral)   Ht 5\' 10"  (1.778 m)   Wt 227 lb (103 kg)   SpO2 99%   BMI 32.57 kg/m   Wt Readings from Last 3 Encounters:  10/16/17 227 lb (103 kg)  04/16/17 242 lb 3.2 oz (109.9 kg)  04/05/17 240 lb (108.9 kg)    Physical Exam  Constitutional: He appears well-developed and well-nourished. No distress.  Weight loss acknowledged  HENT:  Mouth/Throat: No posterior oropharyngeal edema or posterior oropharyngeal erythema.  Narrow posterior OP  Eyes: No scleral icterus.  Cardiovascular: Regular rhythm.  No extrasystoles are present. Bradycardia present.  Pulmonary/Chest: Effort normal and breath sounds normal.  Neurological: He is alert.  Skin: No pallor.  Psychiatric: He has a normal mood and affect.      Assessment & Plan:   Problem List Items Addressed This Visit      Cardiovascular and Mediastinum   Coronary artery disease involving native coronary artery without angina pectoris (Chronic)    Will f/u with cardiologist; I will defer the prescriptions of the statin to cards since they are checking his lipids        Respiratory   Sleep apnea in adult    Discussed possibility of surgery for his narrow posterior OP; refer to ENT for discussion; because of his sleep apnea, I will not prescribe a sedative hypnotic for his insomnia        Other   Obesity (BMI 30.0-34.9)    Patient is working on weight loss with nutrisystem; he declined referral to nutritionist when I suggested and said he would contact the  nutrisystem support line instead; we discussed goal weight to get BMI under 30, calories in vs calories out, etc.      Major depressive disorder, recurrent episode, in partial remission (HCC)    Doing well on current med; refills provided  Relevant Medications   sertraline (ZOLOFT) 100 MG tablet   Insomnia    Long discussion about factors, sleep schedule, proper use of melatonin; recommended CBT-insomnia and I provided him with a list of counselors in the [place] area who can perform this; good sleep hygiene; avoid sedative hypnotics (because of his OSA)       Other Visit Diagnoses    OSA on CPAP    -  Primary   Relevant Orders   Ambulatory referral to ENT   Major depressive disorder, recurrent episode, in full remission (HCC)       Relevant Medications   sertraline (ZOLOFT) 100 MG tablet       Follow up plan: No follow-ups on file.  An after-visit summary was printed and given to the patient at check-out.  Please see the patient instructions which may contain other information and recommendations beyond what is mentioned above in the assessment and plan.  Meds ordered this encounter  Medications  . sertraline (ZOLOFT) 100 MG tablet    Sig: Take 1 tablet (100 mg total) by mouth daily.    Dispense:  90 tablet    Refill:  3    He has a few refills left; leave on file please; thank you    Orders Placed This Encounter  Procedures  . Ambulatory referral to ENT

## 2017-10-16 NOTE — Assessment & Plan Note (Signed)
Patient is working on weight loss with nutrisystem; he declined referral to nutritionist when I suggested and said he would contact the nutrisystem support line instead; we discussed goal weight to get BMI under 30, calories in vs calories out, etc.

## 2017-10-16 NOTE — Assessment & Plan Note (Signed)
Doing well on current med; refills provided

## 2017-10-25 DIAGNOSIS — J342 Deviated nasal septum: Secondary | ICD-10-CM | POA: Insufficient documentation

## 2017-10-25 DIAGNOSIS — Z8659 Personal history of other mental and behavioral disorders: Secondary | ICD-10-CM | POA: Insufficient documentation

## 2017-10-25 DIAGNOSIS — J351 Hypertrophy of tonsils: Secondary | ICD-10-CM | POA: Insufficient documentation

## 2017-11-06 ENCOUNTER — Other Ambulatory Visit: Payer: Self-pay | Admitting: Family Medicine

## 2017-11-06 DIAGNOSIS — N138 Other obstructive and reflux uropathy: Secondary | ICD-10-CM

## 2017-11-06 DIAGNOSIS — N401 Enlarged prostate with lower urinary tract symptoms: Principal | ICD-10-CM

## 2017-11-08 ENCOUNTER — Other Ambulatory Visit: Payer: BC Managed Care – PPO

## 2017-11-12 ENCOUNTER — Encounter: Payer: Self-pay | Admitting: Urology

## 2017-11-12 ENCOUNTER — Ambulatory Visit: Payer: BC Managed Care – PPO | Admitting: Urology

## 2017-11-13 ENCOUNTER — Ambulatory Visit: Payer: BC Managed Care – PPO | Admitting: Urology

## 2017-11-27 ENCOUNTER — Ambulatory Visit: Payer: BC Managed Care – PPO | Admitting: Urology

## 2017-11-27 DIAGNOSIS — F32A Depression, unspecified: Secondary | ICD-10-CM | POA: Insufficient documentation

## 2017-11-27 DIAGNOSIS — F329 Major depressive disorder, single episode, unspecified: Secondary | ICD-10-CM | POA: Insufficient documentation

## 2017-11-27 NOTE — Progress Notes (Deleted)
5:39 AM   Melvin Hatfield [birth year] [id number]  Referring provider: Kerman Passey, MD [address] [place], [place] [address]  No chief complaint on file.   HPI: 47 yo WM with BPH w LU TS, history of nephrolithiasis, a history of hematuria and 38F left renal cyst who presents today for a one year follow up.  BPH WITH LUTS  (prostate and/or bladder) IPSS score: *** PVR: ***   Previous score: 8/1  Previous PVR: 54 mL  Major complaint(s):  x *** years. Denies any dysuria, hematuria or suprapubic pain.   Currently taking: ***.  His has had ***.   Denies any recent fevers, chills, nausea or vomiting.  He has a family history of PCa, with ***.   He does not have a family history of PCa.***    Score:  1-7 Mild 8-19 Moderate 20-35 Severe . History of nephrolithiasis Stone composition 74% Calcium Oxalate Monohydrate, 23% Calcium Oxalate Dihydrate and 3% Calcium Phosphate Carbonate.  Litholink completed but patient did not make dietary changes.  ESWL x 1.  KUB today ***.     History of hematuria Non smoker.  CT urogram on 04/12/2015 revealed bilateral small nonobstructing calculi. He does also have a known left lower pole renal cyst which was previously incompletely characterized, but CT urogram shows definitively that this is not a worrisome or enhancing lesion rather benign cyst.  Cystoscopy performed on 04/23/2015 with Dr. Apolinar Junes noted mild trabeculation of the bladder with a slightly enlarged prostate.   He does not report any gross hematuria.  His UA today is ***.    Left Bosniak 38F cyst Seen on 04/2017 RUS.  Repeat RUS to be scheduled in 04/2018  PMH: Past Medical History:  Diagnosis Date  . Allergy   . Arthritis   . Coronary artery disease involving native coronary artery without angina pectoris   . Depression   . GERD (gastroesophageal reflux disease)   . Hyperlipidemia   . Left nephrolithiasis 08/17/2014  . Major depressive disorder, recurrent  episode, in partial remission (HCC) 07/09/2014   Doing well on current dosage of Sertraline 100mg .   . Myocardial infarction (HCC)   . Sleep apnea     Surgical History: Past Surgical History:  Procedure Laterality Date  . CORONARY ANGIOPLASTY WITH STENT PLACEMENT    . EXTRACORPOREAL SHOCK WAVE LITHOTRIPSY Left 04/05/2017   Procedure: EXTRACORPOREAL SHOCK WAVE LITHOTRIPSY (ESWL);  Surgeon: Vanna Scotland, MD;  Location: [facility] ORS;  Service: Urology;  Laterality: Left;  . reconstructed ear drum    . SPINE SURGERY    . TYMPANOSTOMY TUBE PLACEMENT     patient states he has had 6    Home Medications:  Allergies as of 11/27/2017   No Known Allergies     Medication List        Accurate as of 11/27/17  5:39 AM. Always use your most recent med list.          aspirin 81 MG chewable tablet Chew 81 mg by mouth.   BRILINTA 90 MG Tabs tablet Generic drug:  ticagrelor Take 90 mg by mouth.   CRESTOR 5 MG tablet Generic drug:  rosuvastatin Take 1 tablet (5 mg total) by mouth at bedtime.   docusate sodium 100 MG capsule Commonly known as:  COLACE Take 1 capsule (100 mg total) by mouth 2 (two) times daily.   omeprazole 20 MG capsule Commonly known as:  PRILOSEC Take 1 capsule (20 mg total) by mouth daily.  sertraline 100 MG tablet Commonly known as:  ZOLOFT Take 1 tablet (100 mg total) by mouth daily.   tamsulosin 0.4 MG Caps capsule Commonly known as:  FLOMAX Take 1 capsule (0.4 mg total) by mouth daily.       Allergies: No Known Allergies  Family History: Family History  Problem Relation Age of Onset  . Cancer Mother   . Depression Mother   . Stroke Maternal Grandmother   . Stroke Maternal Grandfather   . Kidney disease Neg Hx   . Prostate cancer Neg Hx     Social History:  reports that he has never smoked. He has never used smokeless tobacco. He reports that he does not drink alcohol or use drugs.  ROS:     Physical Exam: There were no vitals taken for  this visit. Constitutional: Well nourished. Alert and oriented, No acute distress. HEENT: [place] AT, moist mucus membranes. Trachea midline, no masses. Cardiovascular: No clubbing, cyanosis, or edema. Respiratory: Normal respiratory effort, no increased work of breathing. GI: Abdomen is soft, non tender, non distended, no abdominal masses. Liver and spleen not palpable.  No hernias appreciated.  Stool sample for occult testing is not indicated.   GU: No CVA tenderness.  No bladder fullness or masses.  Patient with circumcised/uncircumcised phallus. ***Foreskin easily retracted***  Urethral meatus is patent.  No penile discharge. No penile lesions or rashes. Scrotum without lesions, cysts, rashes and/or edema.  Testicles are located scrotally bilaterally. No masses are appreciated in the testicles. Left and right epididymis are normal. Rectal: Patient with  normal sphincter tone. Anus and perineum without scarring or rashes. No rectal masses are appreciated. Prostate is approximately *** grams, *** nodules are appreciated. Seminal vesicles are normal. Skin: No rashes, bruises or suspicious lesions. Lymph: No cervical or inguinal adenopathy. Neurologic: Grossly intact, no focal deficits, moving all 4 extremities. Psychiatric: Normal mood and affect.   Laboratory Data: PSA 0.7 ng/mL on 10/07/2015 PSA 0.9 ng/mL on 11/06/2016  I have reviewed the labs.  Pertinent Imaging *** I have independently reviewed the films  Assessment & Plan:    1. Left renal cyst Bosniak 60F cyst Repeat renal ultrasound to be completed in 04/2018  2. History of nephrolithiasis KUB ***  3. History of hematuria Completed a hematuria work up in 03/2015 - no worrisome findings UA today *** RTC in one year for UA  Will report any gross hematuria  4. BPH with LUTS IPSS score is ***, it is stable/improving/worsening Continue conservative management, avoiding bladder irritants and timed voiding's Most bothersome  symptoms is/are *** Initiate alpha-blocker (***), discussed side effects *** Initiate 5 alpha reductase inhibitor (***), discussed side effects *** Continue tamsulosin 0.4 mg daily, alfuzosin 10 mg daily, Rapaflo 8 mg daily, terazosin, doxazosin, Cialis 5 mg daily and finasteride 5 mg daily, dutasteride 0.5 mg daily***:refills given Cannot tolerate medication or medication failure, schedule cystoscopy *** RTC in *** months for IPSS, PSA, PVR and exam      No follow-ups on file.  Asberry Lascola, PA-C

## 2017-11-28 ENCOUNTER — Encounter: Payer: Self-pay | Admitting: Urology

## 2017-12-28 ENCOUNTER — Encounter: Payer: Self-pay | Admitting: Nurse Practitioner

## 2017-12-28 ENCOUNTER — Ambulatory Visit: Payer: BC Managed Care – PPO | Admitting: Nurse Practitioner

## 2017-12-28 ENCOUNTER — Ambulatory Visit
Admission: RE | Admit: 2017-12-28 | Discharge: 2017-12-28 | Disposition: A | Discharge status: Home / Self Care | Payer: BC Managed Care – PPO | Source: Ambulatory Visit | Attending: Nurse Practitioner | Admitting: Nurse Practitioner

## 2017-12-28 VITALS — BP 112/80 | HR 61 | Temp 98.2°F | Resp 16 | Ht 70.0 in | Wt 218.6 lb

## 2017-12-28 DIAGNOSIS — M79672 Pain in left foot: Secondary | ICD-10-CM | POA: Insufficient documentation

## 2017-12-28 IMAGING — CR DG FOOT COMPLETE 3+V*L*
1 series · 3 of 3 positions shown · non-contrast
Comparison: None.

CLINICAL DATA: Awakened with left foot pain. No known injury pain
is located in the dorsal aspect and lateral aspects of the foot.
History of gout.

EXAM:
LEFT FOOT - COMPLETE 3+ VIEW

[Series 1: dg foot complete left · 0.14mm/px · 3 of 3 slices shown]
[im 1/3]
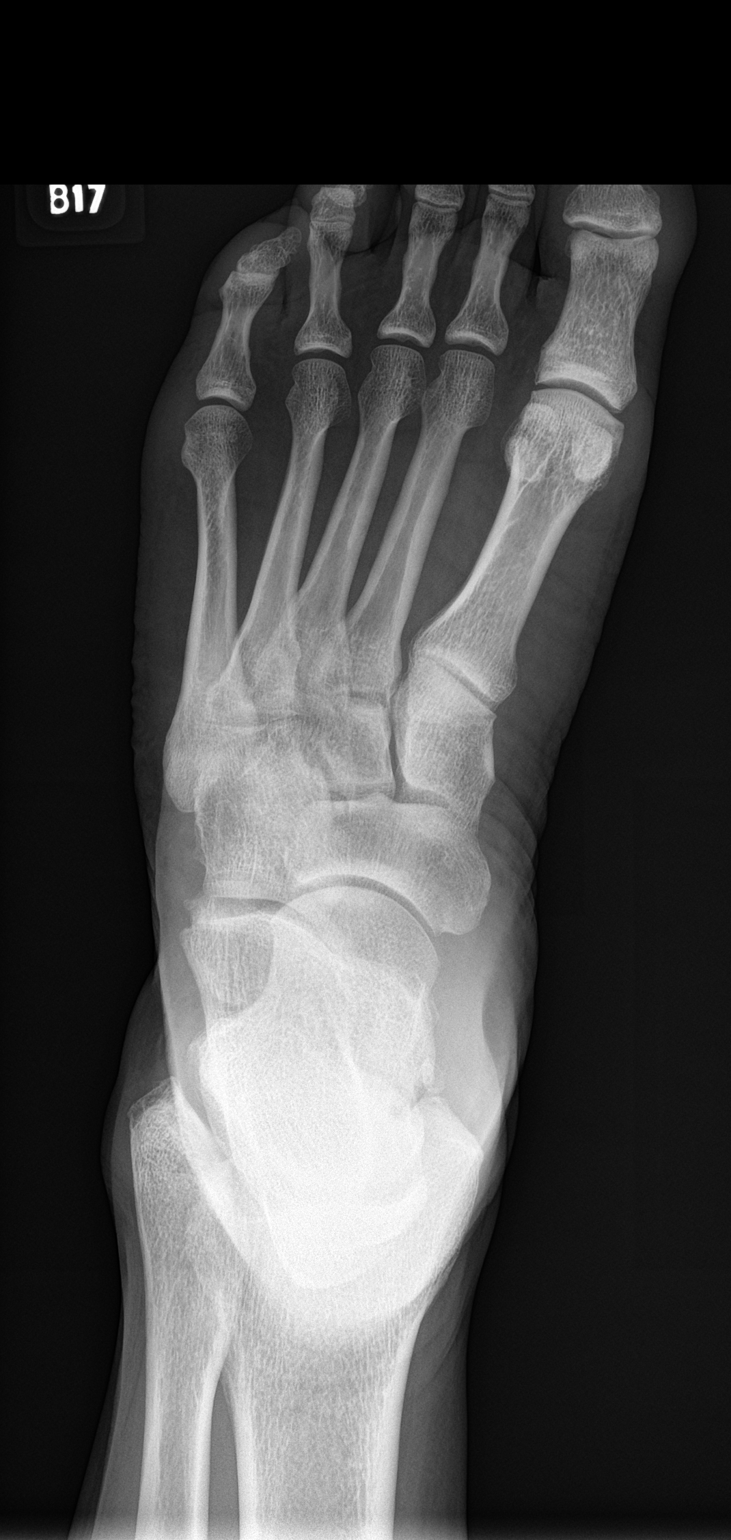
[im 2/3]
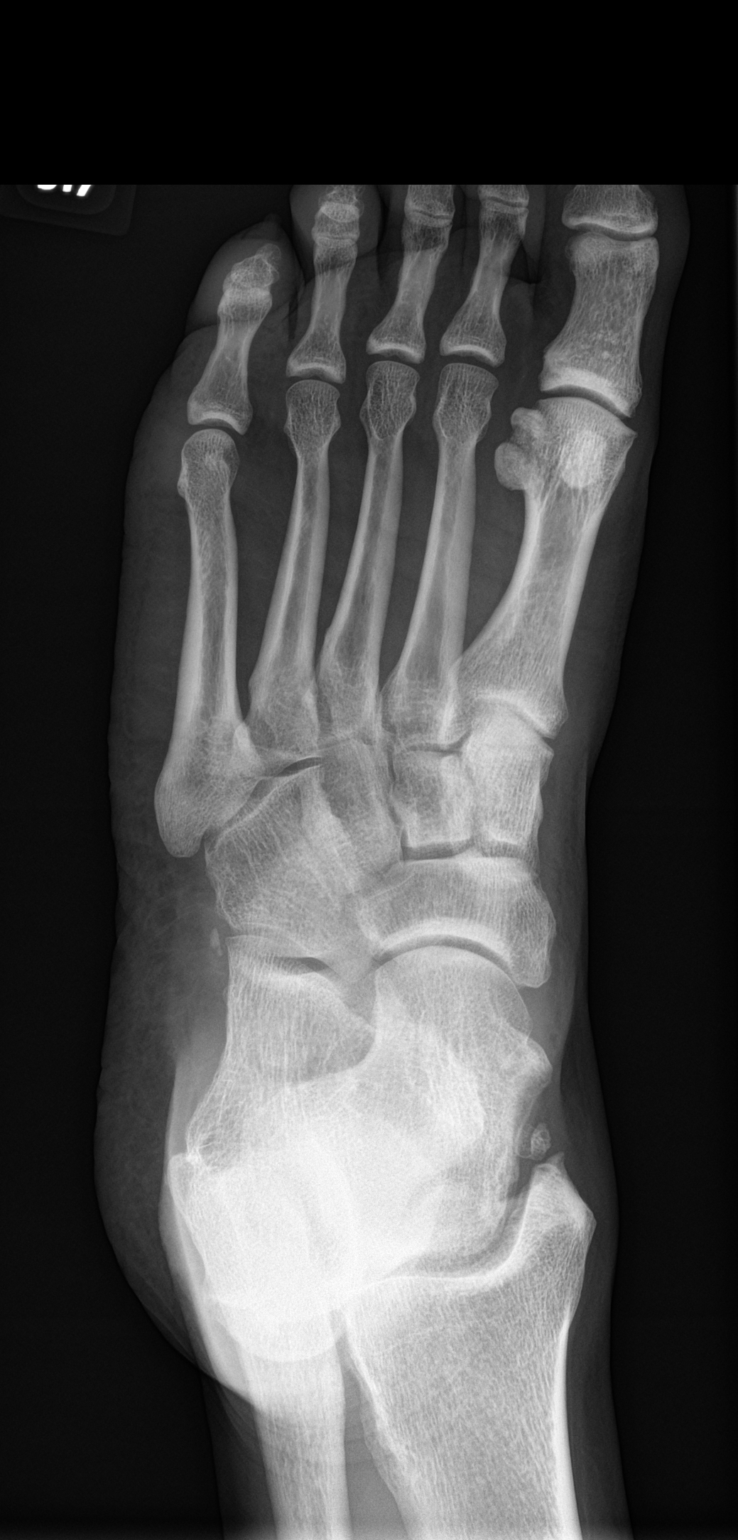
[im 3/3]
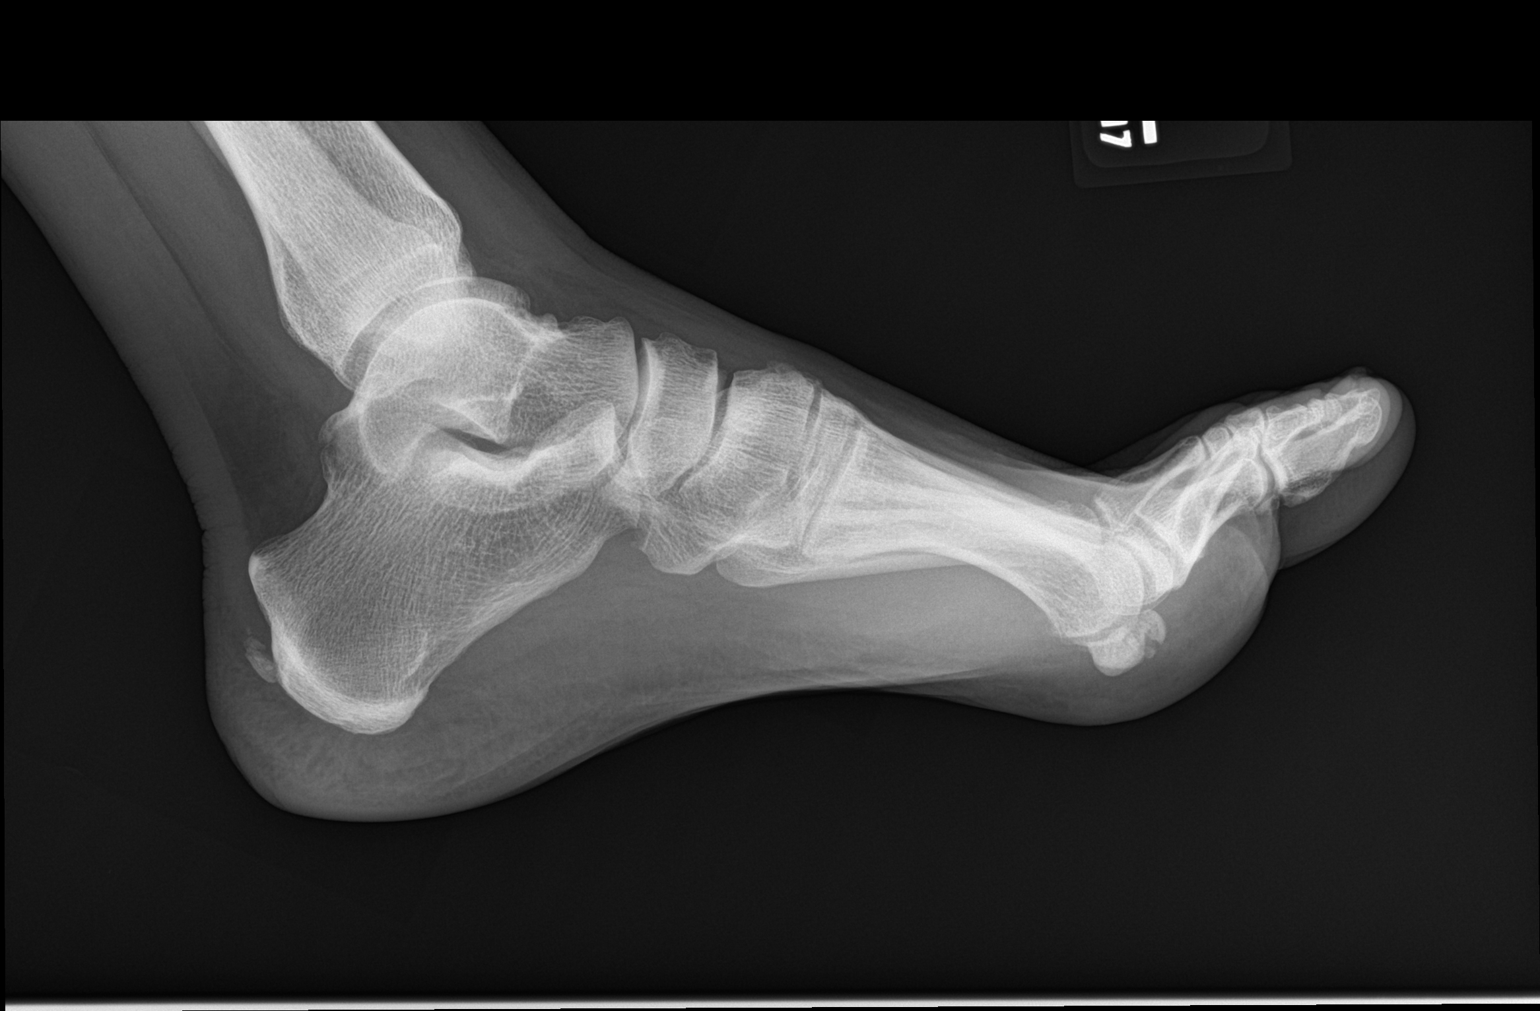

[3 of 3 positions shown; findings below may reference images not displayed]

FINDINGS: The bones are subjectively adequately mineralized. There is a
bipartite lateral sesamoid bone at the first MTP joint. The joint
spaces are well maintained. No proliferative or erosive arthropathic
changes are observed. There is some calcification in the region of
the insertion of the Achilles tendon.
IMPRESSION: No acute fracture nor dislocation of the left foot is observed. No
significant arthropathy is demonstrated. Bipartite lateral sesamoid
bone at the first MTP joint may be post traumatic or congenital.

## 2017-12-28 MED ORDER — MELOXICAM 7.5 MG PO TABS: 7.5000 mg | ORAL_TABLET | Freq: Every day | ORAL | 0 refills | Status: DC

## 2017-12-28 NOTE — Progress Notes (Signed)
melName: Melvin Hatfield   MRN: [id number]    DOB: 1970/11/18   Date:12/28/2017       Progress Note  Subjective  Chief Complaint  Chief Complaint  Patient presents with  . Foot Pain    left foot pain started today    HPI  Top of left foot pain started today; states had the pain in the past not recently, but was at its worst today. No precipitating injury, trauma. States did play ping-pong tournament and was on his feet at lot the past few days.  No swelling, redness.  Took ibuprofen and cherry extract with moderate pain relief.  States has had gout in the past and concerned it is that   Patient Active Problem List   Diagnosis Date Noted  . Depression 11/27/2017  . Obesity (BMI 30.0-34.9) 10/16/2017  . Muscle fasciculation 08/17/2016  . Medication monitoring encounter 08/17/2016  . Leg cramps 03/01/2016  . Dizziness 09/09/2015  . Skin lesion of right upper extremity 01/12/2015  . Contusion of toenail 01/12/2015  . Insomnia 01/12/2015  . Esophagitis, reflux 01/12/2015  . Rib pain on left side 01/12/2015  . Left renal mass 10/06/2014  . Nephrolithiasis 10/06/2014  . Left nephrolithiasis 08/17/2014  . Drug-induced skin rash 08/17/2014  . GERD without esophagitis 07/09/2014  . Sleep apnea in adult 07/09/2014  . 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 Hyperlipidemia LDL goal <70 07/09/2014  . History of myocardial infarct at age less than 60 years 07/09/2014  . Coronary artery disease involving native coronary artery without angina pectoris 07/09/2014  . 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 Major depressive disorder, recurrent episode, in partial remission (HCC) 07/09/2014  . PAF (paroxysmal atrial fibrillation) (HCC) 06/16/2014    Past Medical History:  Diagnosis Date  . Allergy   . Arthritis   . Coronary artery disease involving native coronary artery without angina pectoris   .  Depression   . GERD (gastroesophageal reflux disease)   . Hyperlipidemia   . Left nephrolithiasis 08/17/2014  . Major depressive disorder, recurrent episode, in partial remission (HCC) 07/09/2014   Doing well on current dosage of Sertraline 100mg .   . Myocardial infarction (HCC)   . Sleep apnea     Past Surgical History:  Procedure Laterality Date  . CORONARY ANGIOPLASTY WITH STENT PLACEMENT    . EXTRACORPOREAL SHOCK WAVE LITHOTRIPSY Left 04/05/2017   Procedure: EXTRACORPOREAL SHOCK WAVE LITHOTRIPSY (ESWL);  Surgeon: Vanna Scotland, MD;  Location: [facility] ORS;  Service: Urology;  Laterality: Left;  . reconstructed ear drum    . SPINE SURGERY    . TYMPANOSTOMY TUBE PLACEMENT     patient states he has had 6    Social History   Tobacco Use  . Smoking status: Never Smoker  . Smokeless tobacco: Never Used  Substance Use Topics  . Alcohol use: No    Alcohol/week: 0.0 standard drinks     Current Outpatient Medications:  .  aspirin 81 MG chewable tablet, Chew 81 mg by mouth., Disp: , Rfl:  .  docusate sodium (COLACE) 100 MG capsule, Take 1 capsule (100 mg total) by mouth 2 (two) times daily., Disp: 60 capsule, Rfl: 0 .  omeprazole (PRILOSEC) 20 MG capsule, Take 1 capsule (20 mg total) by mouth daily., Disp: 30 capsule, Rfl: 3 .  rosuvastatin (CRESTOR) 5 MG tablet, Take 1 tablet (5 mg total) by mouth at bedtime., Disp: , Rfl:  .  sertraline (ZOLOFT) 100 MG tablet, Take 1 tablet (100 mg total) by mouth daily., Disp: 90 tablet, Rfl: 3 .  ticagrelor (BRILINTA) 90 MG TABS tablet, Take 90 mg by mouth., Disp: , Rfl:  .  tamsulosin (FLOMAX) 0.4 MG CAPS capsule, Take 1 capsule (0.4 mg total) by mouth daily. (Patient not taking: Reported on 12/28/2017), Disp: 30 capsule, Rfl: 0  No Known Allergies  ROS  No other specific complaints in a complete review of systems (except as listed in HPI above).  Objective  Vitals:   12/28/17 1313  BP: 112/80  Pulse: 61  Resp: 16  Temp: 98.2 F (36.8  C)  TempSrc: Oral  SpO2: 98%  Weight: 218 lb 9.6 oz (99.2 kg)  Height: 5\' 10"  (1.778 m)     Body mass index is 31.37 kg/m.  Nursing Note and Vital Signs reviewed.  Physical Exam  Constitutional: He is oriented to person, place, and time. He appears well-developed and well-nourished.  Cardiovascular: Normal rate.  Pulses:      Dorsalis pedis pulses are 3+ on the right side, and 3+ on the left side.       Posterior tibial pulses are 3+ on the right side, and 3+ on the left side.  Pulmonary/Chest: Effort normal.  Musculoskeletal:       Left ankle: Normal. He exhibits normal range of motion, no swelling and no deformity.       Feet:  Neurological: He is alert and oriented to person, place, and time.  Skin: Skin is warm and dry. No erythema.  Psychiatric: He has a normal mood and affect. His behavior is normal.     No results found for this or any previous visit (from the past 48 hour(s)).  Assessment & Plan 1. Left foot pain Patient also noting some long-standing arch pain offered referral to podiatry; can order post-xray if needed,  - DG Foot Complete Left; Future - meloxicam (MOBIC) 7.5 MG tablet; Take 1 tablet (7.5 mg total) by mouth daily.  Dispense: 30 tablet; Refill: 0

## 2017-12-28 NOTE — Patient Instructions (Signed)
-   Go across the street to get imaging - Continue taking ibuprofen (no more than 2,400 mg a day) and using ice. If pain is not relieved stop the ibuprofen and take meloxicam instead. Take this with meals and when on this medication avoid other NSAIDs (ibuprofen, naproxen, advil, aleve) can additionally take acetaminophen.

## 2018-10-16 ENCOUNTER — Encounter: Payer: Self-pay | Admitting: Family Medicine

## 2018-10-16 ENCOUNTER — Telehealth: Payer: Self-pay | Admitting: Urology

## 2018-10-16 ENCOUNTER — Other Ambulatory Visit: Payer: Self-pay | Admitting: Family Medicine

## 2018-10-16 DIAGNOSIS — Z87442 Personal history of urinary calculi: Secondary | ICD-10-CM

## 2018-10-16 NOTE — Telephone Encounter (Signed)
Patient has an app on Monday and needs an order for a KUB he is going tomorrow   Michelle

## 2018-10-17 NOTE — Progress Notes (Deleted)
11:39 AM   Melvin Hatfield 01-14-1971 [id number]  Referring provider: Arnetha Courser, MD [id number] [place],  [place] [address]  No chief complaint on file.   HPI: 48 yo male with BPH w LU TS, nephrolithiasis, a history of hematuria and a Bosniak 2 renal cyst who presents today for follow up.  BPH WITH LUTS  (prostate and/or bladder) IPSS score: *** PVR: ***   Previous score: 8/1  Previous PVR: ***   Major complaint(s):  x *** years. Denies any dysuria, hematuria or suprapubic pain.   Currently taking: ***.  His has had ***.   Denies any recent fevers, chills, nausea or vomiting.  He has a family history of PCa, with ***.   He does not have a family history of PCa.***           Nephrolithiasis CT was performed in 03/2015 and it noted bilateral renal calculi likely accounting for the patient's microhematuria. No obstructing ureteral calculi or bladder calculi.  Lower pole left renal cyst. No worrisome renal lesions and no bladder abnormality.  No other significant abdominal/pelvic findings.  Patient's KUB taken on 11/13/2016 noted a left lower pole 11 mm nephrolithiasis which is stable. The previous punctate right lower pole stone was unable to be visualized.    CT Renal stone study performed on 03/21/2017 noted bilateral nonobstructing renal calculi, measuring up to 5 mm in the left lower pole, as above. No ureteral or bladder calculi. No hydronephrosis.  15 mm left lower pole renal cyst, benign (Bosniak I).   KUB taken on 04/05/2017 noted an unchanged left lower pole renal calculi.    He underwent left ESWL on 04/05/2017 with Dr. Erlene Quan.  The procedure was uneventful and as expected.  KUB taken 04/16/2017 noted punctate stone fragments remaining in the lower pole of the left kidney. No evidence of stones elsewhere.  Stone composition 74% Calcium Oxalate Monohydrate, 23% Calcium Oxalate Dihydrate and 3% Calcium Phosphate Carbonate.   RUS 04/2017 revealed  bilateral nephrolithiasis. No hydronephrosis or renal obstruction is noted.  16 mm septated cyst is seen in midpole of left kidney consistent with Bosniak type 2 lesion. Follow-up ultrasound in 12 months is recommended to ensure stability.  History of hematuria Incidental on UA. No gross hematuria or flank pain.  CT urogram on 04/12/2015 reviewed personally. This reveals bilateral small nonobstructing calculi. He does also have a known left lower pole renal cyst which was previously incompletely characterized, but CT urogram shows definitively that this is not a worrisome or enhancing lesion rather benign cyst.  Cystoscopy performed on 04/23/2015 with Dr. Erlene Quan noted mild trabeculation of the bladder with a slightly enlarged prostate.     Left Bosniak type 2 lesion RUS on 10/18/2018 revealed an 8.2 mm nonobstructive stone in the lower left kidney.  There is a complex cyst in the midpole of the left kidney measuring 1.6 x 1.8 x 1.5 cm today versus 1.6 x 1.6 x 1.6 cm on the April 26, 2017 ultrasound, up to 1.5 cm on the March 21, 2017 CT scan and up to 1.1 cm on the CT scan from April 12, 2015. This mass is nonspecific on today's study. Given interval growth, recommend an MRI for more complete assessment.   PMH: Past Medical History:  Diagnosis Date  . Allergy   . Arthritis   . Coronary artery disease involving native coronary artery without angina pectoris   . Depression   . GERD (gastroesophageal reflux disease)   .  Hyperlipidemia   . Left nephrolithiasis 08/17/2014  . Major depressive disorder, recurrent episode, in partial remission (HCC) 07/09/2014   Doing well on current dosage of Sertraline 100mg .   . Myocardial infarction (HCC)   . Sleep apnea     Surgical History: Past Surgical History:  Procedure Laterality Date  . CORONARY ANGIOPLASTY WITH STENT PLACEMENT    . EXTRACORPOREAL SHOCK WAVE LITHOTRIPSY Left 04/05/2017   Procedure: EXTRACORPOREAL SHOCK WAVE LITHOTRIPSY (ESWL);   Surgeon: 04/07/2017, MD;  Location: [facility] ORS;  Service: Urology;  Laterality: Left;  . reconstructed ear drum    . SPINE SURGERY    . TYMPANOSTOMY TUBE PLACEMENT     patient states he has had 6    Home Medications:  Allergies as of 10/21/2018   No Known Allergies     Medication List       Accurate as of October 17, 2018 11:39 AM. If you have any questions, ask your nurse or doctor.        aspirin 81 MG chewable tablet Chew 81 mg by mouth.   Brilinta 90 MG Tabs tablet Generic drug: ticagrelor Take 90 mg by mouth.   Crestor 5 MG tablet Generic drug: rosuvastatin Take 1 tablet (5 mg total) by mouth at bedtime.   docusate sodium 100 MG capsule Commonly known as: COLACE Take 1 capsule (100 mg total) by mouth 2 (two) times daily.   meloxicam 7.5 MG tablet Commonly known as: MOBIC Take 1 tablet (7.5 mg total) by mouth daily.   omeprazole 20 MG capsule Commonly known as: PRILOSEC Take 1 capsule (20 mg total) by mouth daily.   sertraline 100 MG tablet Commonly known as: ZOLOFT Take 1 tablet (100 mg total) by mouth daily.   tamsulosin 0.4 MG Caps capsule Commonly known as: Flomax Take 1 capsule (0.4 mg total) by mouth daily.       Allergies: No Known Allergies  Family History: Family History  Problem Relation Age of Onset  . Cancer Mother   . Depression Mother   . Stroke Maternal Grandmother   . Stroke Maternal Grandfather   . Kidney disease Neg Hx   . Prostate cancer Neg Hx     Social History:  reports that he has never smoked. He has never used smokeless tobacco. He reports that he does not drink alcohol or use drugs.  ROS:     Physical Exam: Blood pressure 127/79, pulse (!) 45, height 5\' 10"  (1.778 m), weight 242 lb 3.2 oz (109.9 kg). Constitutional: Well nourished. Alert and oriented, No acute distress. HEENT: [place] AT, moist mucus membranes. Trachea midline, no masses. Cardiovascular: No clubbing, cyanosis, or edema. Respiratory: Normal  respiratory effort, no increased work of breathing. Skin: No rashes, bruises or suspicious lesions. Lymph: No cervical or inguinal adenopathy. Neurologic: Grossly intact, no focal deficits, moving all 4 extremities. Psychiatric: Normal mood and affect.     Laboratory Data: PSA 0.7 ng/mL on 10/07/2015 PSA 0.9 ng/mL on 11/06/2016 PSA 0.90 ng/mL on 10/18/2018   I have reviewed the labs.  Pertinent Imaging CLINICAL DATA:  History of kidney stones.  EXAM: RENAL / URINARY TRACT ULTRASOUND COMPLETE  COMPARISON:  April 26, 2017  FINDINGS: Right Kidney:  Renal measurements: 10.8 x 6.1 x 5.9 cm = volume: 201.1 mL . Echogenicity within normal limits. No mass or hydronephrosis visualized.  Left Kidney:  Renal measurements: 10.7 x 6.4 x 5.9 cm = volume: 211.1 mL. Contains an 8.2 mm nonobstructive stone. No hydronephrosis. A complex cyst measuring  1.6 x 1.8 x 1.5 cm in the mid pole of the left kidney was previously described as a Bosniak 2 lesion cyst on the previous ultrasound and a cyst on the CT scan from March 21, 2017.  Bladder:  Appears normal for degree of bladder distention.  IMPRESSION: 1. 8.2 mm nonobstructive stone in the lower left kidney. 2. There is a complex cyst in the midpole of the left kidney measuring 1.6 x 1.8 x 1.5 cm today versus 1.6 x 1.6 x 1.6 cm on the April 26, 2017 ultrasound, up to 1.5 cm on the March 21, 2017 CT scan and up to 1.1 cm on the CT scan from April 12, 2015. This mass is nonspecific on today's study. Given interval growth, recommend an MRI for more complete assessment.  These results will be called to the ordering clinician or representative by the Radiologist Assistant, and communication documented in the PACS or zVision Dashboard.   Electronically Signed   By: Gerome Sam III M.D   On: 10/18/2018 20:59 I have independently reviewed the films ***  Assessment & Plan:    1. BPH with LUTS IPSS score is ***, it  is stable/improving/worsening Continue conservative management, avoiding bladder irritants and timed voiding's Most bothersome symptoms is/are *** Initiate alpha-blocker (***), discussed side effects *** Initiate 5 alpha reductase inhibitor (***), discussed side effects *** Continue tamsulosin 0.4 mg daily, alfuzosin 10 mg daily, Rapaflo 8 mg daily, terazosin, doxazosin, Cialis 5 mg daily and finasteride 5 mg daily, dutasteride 0.5 mg daily***:refills given Cannot tolerate medication or medication failure, schedule cystoscopy *** RTC in *** months for IPSS, PSA, PVR and exam   2. Bilateral nephrolithiasis Patient completed a 24 hour metabolic work up in 2016 and he readily admits that he did not make the changes suggested to him  3. History of hematuria Completed a hematuria work up in 03/2015 - no worrisome findings UA *** No reports of gross hematuria  4. Complex left renal cyst Interval growth noted, will obtain a MRI of the lesion at this time RTC pending MRI results       No follow-ups on file.  Zaina Jenkin, PA-C

## 2018-10-18 ENCOUNTER — Ambulatory Visit
Admission: RE | Admit: 2018-10-18 | Discharge: 2018-10-18 | Disposition: A | Discharge status: Home / Self Care | Payer: BC Managed Care – PPO | Source: Ambulatory Visit | Attending: Urology | Admitting: Urology

## 2018-10-18 ENCOUNTER — Other Ambulatory Visit: Payer: Self-pay

## 2018-10-18 ENCOUNTER — Other Ambulatory Visit
Admission: RE | Admit: 2018-10-18 | Discharge: 2018-10-18 | Disposition: A | Discharge status: Home / Self Care | Payer: BC Managed Care – PPO | Source: Home / Self Care | Attending: Urology | Admitting: Urology

## 2018-10-18 DIAGNOSIS — N200 Calculus of kidney: Secondary | ICD-10-CM | POA: Insufficient documentation

## 2018-10-18 DIAGNOSIS — N401 Enlarged prostate with lower urinary tract symptoms: Secondary | ICD-10-CM | POA: Diagnosis not present

## 2018-10-18 DIAGNOSIS — N139 Obstructive and reflux uropathy, unspecified: Secondary | ICD-10-CM | POA: Diagnosis not present

## 2018-10-18 DIAGNOSIS — Z87442 Personal history of urinary calculi: Secondary | ICD-10-CM | POA: Insufficient documentation

## 2018-10-18 DIAGNOSIS — N281 Cyst of kidney, acquired: Secondary | ICD-10-CM | POA: Diagnosis not present

## 2018-10-18 DIAGNOSIS — N138 Other obstructive and reflux uropathy: Secondary | ICD-10-CM

## 2018-10-18 LAB — PSA: Prostatic Specific Antigen: 0.9 ng/mL (ref 0.00–4.00)

## 2018-10-18 IMAGING — US US RENAL
1 series · 13 of 25 positions shown · non-contrast
Comparison: [DATE]

CLINICAL DATA: History of kidney stones.

EXAM:
RENAL / URINARY TRACT ULTRASOUND COMPLETE

[Series 1: us renal · 0.26mm/px · 13 of 48 slices shown]
[im 1/48]
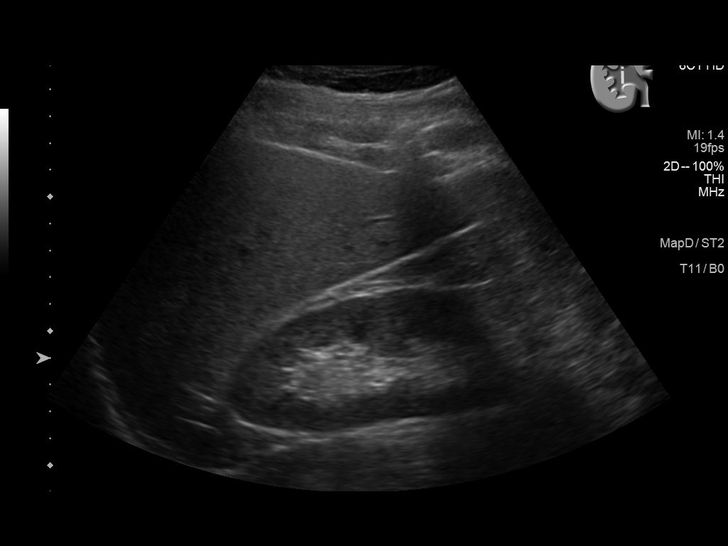
[im 4/48]
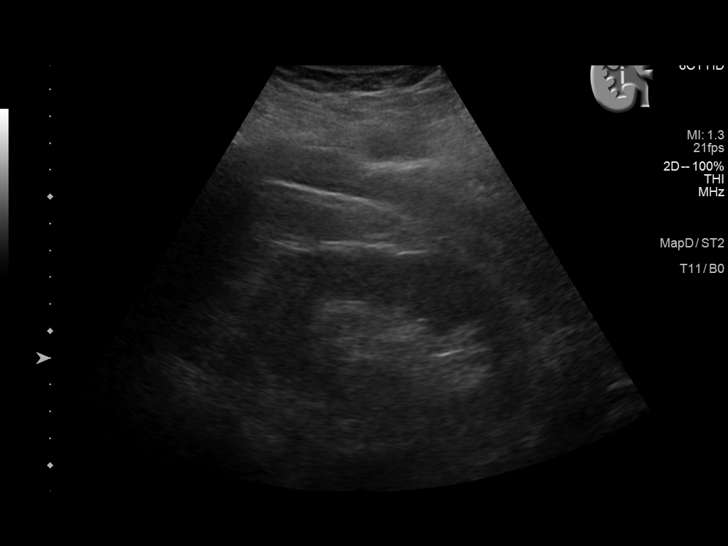
[im 8/48]
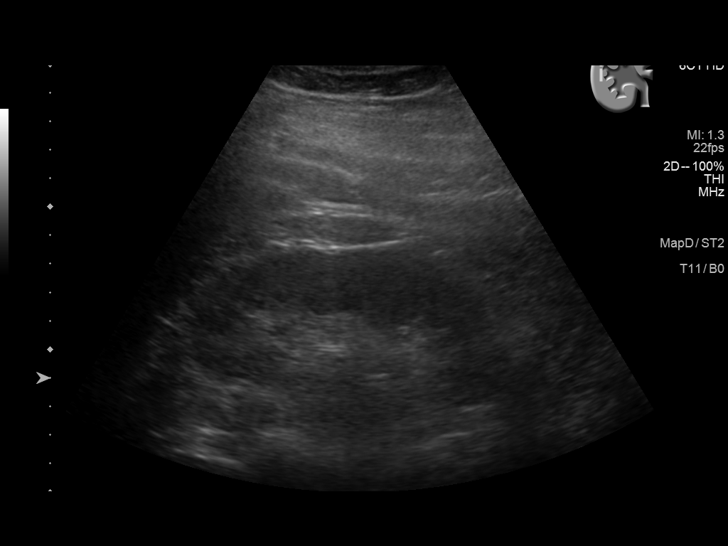
[im 12/48]
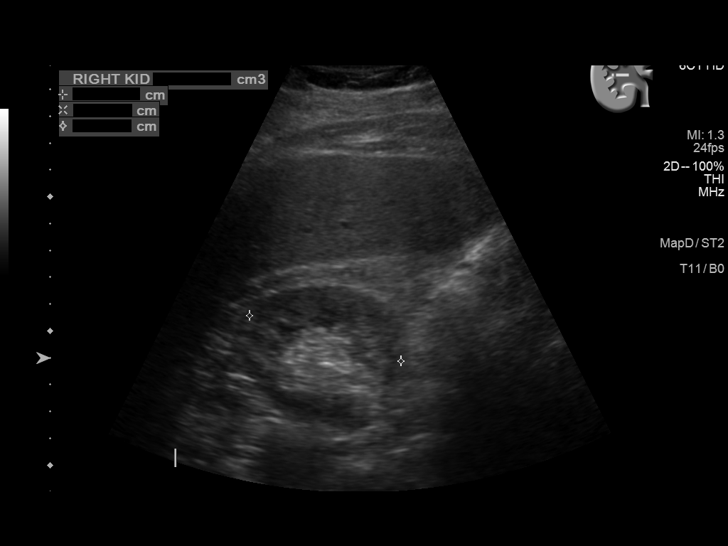
[im 16/48]
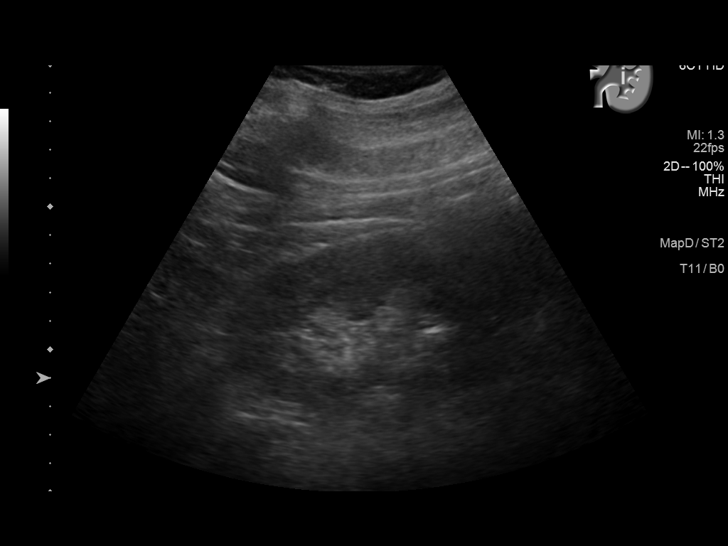
[im 20/48]
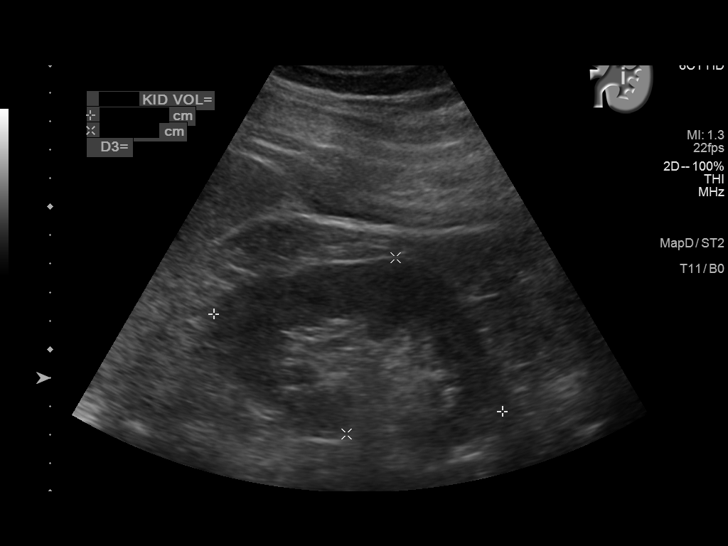
[im 24/48]
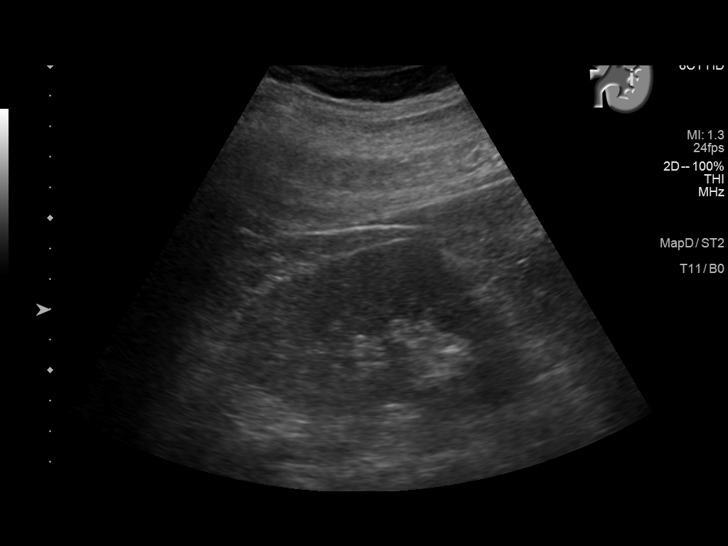
[im 28/48]
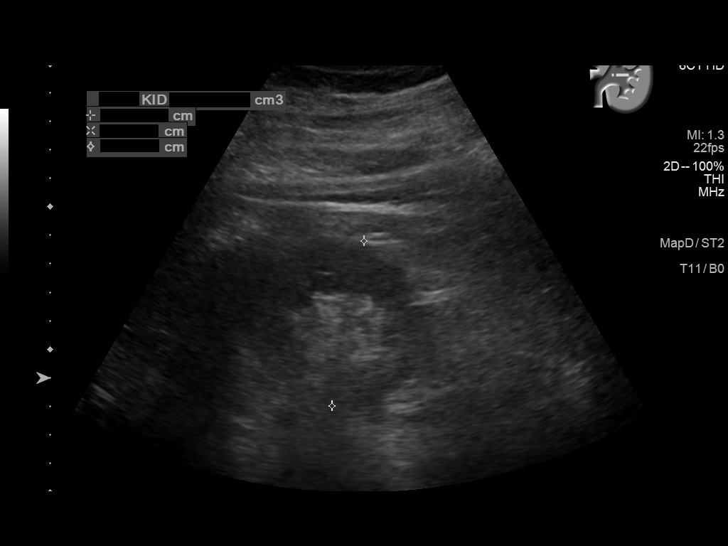
[im 32/48]
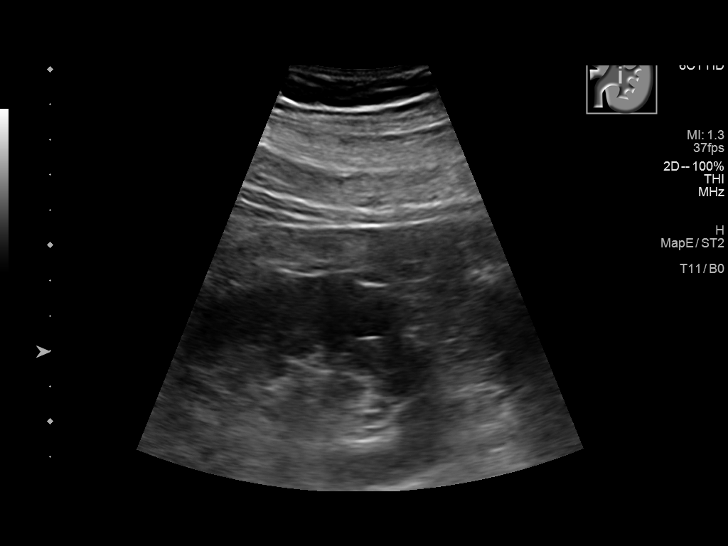
[im 36/48]
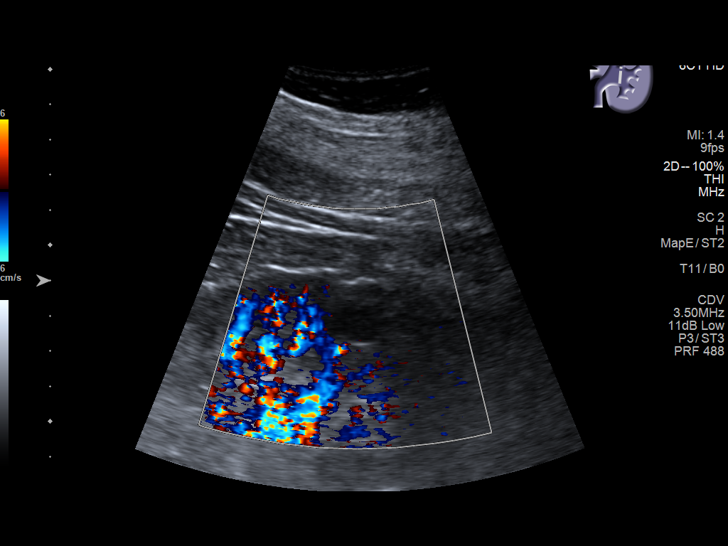
[im 40/48]
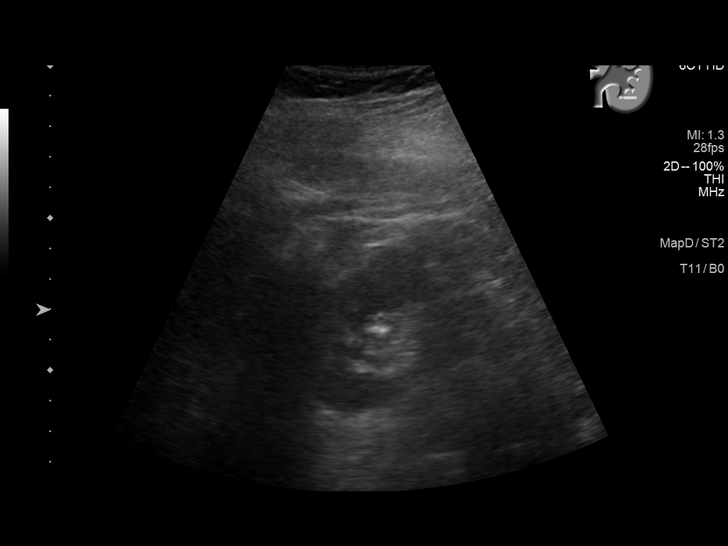
[im 44/48]
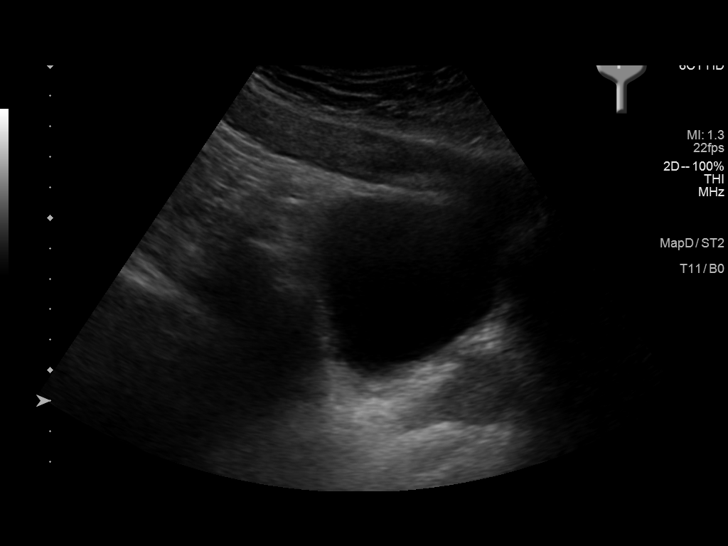
[im 48/48]
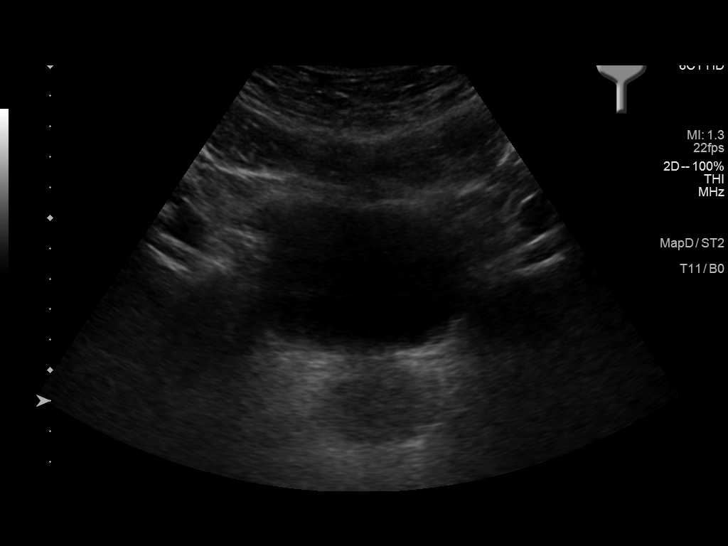

[13 of 25 positions shown; findings below may reference images not displayed]

FINDINGS: Right Kidney:

Renal measurements: 10.8 x 6.1 x 5.9 cm = volume: 201.1 mL .
Echogenicity within normal limits. No mass or hydronephrosis
visualized.

Left Kidney:

Renal measurements: 10.7 x 6.4 x 5.9 cm = volume: 211.1 mL. Contains
an 8.2 mm nonobstructive stone. No hydronephrosis. A complex cyst
measuring 1.6 x 1.8 x 1.5 cm in the mid pole of the left kidney was
previously described as a Bosniak 2 lesion cyst on the previous
ultrasound and a cyst on the CT scan from [DATE].

Bladder:

Appears normal for degree of bladder distention.
IMPRESSION: 1. 8.2 mm nonobstructive stone in the lower left kidney.
2. There is a complex cyst in the midpole of the left kidney
measuring 1.6 x 1.8 x 1.5 cm today versus 1.6 x 1.6 x 1.6 cm on the
[DATE] ultrasound, up to 1.5 cm on the [DATE] CT
scan and up to 1.1 cm on the CT scan from [DATE]. This mass
is nonspecific on today's study. Given interval growth, recommend an
MRI for more complete assessment.

These results will be called to the ordering clinician or
representative by the Radiologist Assistant, and communication
documented in the PACS or zVision Dashboard.

## 2018-10-18 NOTE — Addendum Note (Signed)
Addended by: Elmo Putt on: 10/18/2018 12:51 PM   Modules accepted: Orders

## 2018-10-21 ENCOUNTER — Ambulatory Visit: Payer: BC Managed Care – PPO | Admitting: Urology

## 2018-10-21 ENCOUNTER — Other Ambulatory Visit: Payer: Self-pay | Admitting: Family Medicine

## 2018-10-21 DIAGNOSIS — Z87442 Personal history of urinary calculi: Secondary | ICD-10-CM

## 2018-10-25 ENCOUNTER — Other Ambulatory Visit: Payer: Self-pay

## 2018-10-25 ENCOUNTER — Encounter: Payer: Self-pay | Admitting: Family Medicine

## 2018-10-25 ENCOUNTER — Ambulatory Visit (INDEPENDENT_AMBULATORY_CARE_PROVIDER_SITE_OTHER): Payer: BC Managed Care – PPO | Admitting: Family Medicine

## 2018-10-25 DIAGNOSIS — Z5181 Encounter for therapeutic drug level monitoring: Secondary | ICD-10-CM | POA: Diagnosis not present

## 2018-10-25 DIAGNOSIS — I480 Paroxysmal atrial fibrillation: Secondary | ICD-10-CM

## 2018-10-25 DIAGNOSIS — F3342 Major depressive disorder, recurrent, in full remission: Secondary | ICD-10-CM

## 2018-10-25 DIAGNOSIS — E785 Hyperlipidemia, unspecified: Secondary | ICD-10-CM

## 2018-10-25 DIAGNOSIS — E6609 Other obesity due to excess calories: Secondary | ICD-10-CM

## 2018-10-25 MED ORDER — SERTRALINE HCL 100 MG PO TABS: 100.0000 mg | ORAL_TABLET | Freq: Every day | ORAL | 3 refills | Status: DC

## 2018-10-25 MED ORDER — TICAGRELOR 90 MG PO TABS: 90.0000 mg | ORAL_TABLET | Freq: Two times a day (BID) | ORAL | 0 refills | Status: AC

## 2018-10-25 MED ORDER — ROSUVASTATIN CALCIUM 5 MG PO TABS: 5.0000 mg | ORAL_TABLET | Freq: Every day | ORAL | 1 refills | Status: DC

## 2018-10-25 NOTE — Progress Notes (Signed)
Name: Melvin MadeiraJason Hatfield   MRN: [id number]    DOB: 11/20/1970   Date:10/25/2018       Progress Note  Subjective:    Chief Complaint  Chief Complaint  Patient presents with   Medication Refill   Depression   Hyperlipidemia    I connected with  Melvin MadeiraJason Harville  on 10/25/18 at  3:40 PM EDT by a video enabled telemedicine application and verified that I am speaking with the correct person using two identifiers.  I discussed the limitations of evaluation and management by telemedicine and the availability of in person appointments. The patient expressed understanding and agreed to proceed. Staff also discussed with the patient that there may be a patient responsible charge related to this service. Patient Location: home Provider [facility] Additional Individuals present: none  HPI  Pt request med refills -  Hyperlipidemia: Meds usually through his Dr. Isaias CowmanPria Costa-  [place] [facility]) Current Medication Regimen:  crestor 5 mg Last Lipids: Lab Results  Component Value Date   CHOL 215 (H) 08/17/2016   HDL 45 08/17/2016   LDLCALC 122 (H) 08/17/2016   TRIG 239 (H) 08/17/2016   CHOLHDL 4.8 08/17/2016   - Current Diet:  Has been poor/unhealthy but changing monday - Denies: Chest pain, shortness of breath, myalgias. - Documented aortic atherosclerosis? No - Risk factors for atherosclerosis: hypercholesterolemia and hypertension  Med refill of brilinta - also per cardiology - no bleeding concerns or sx per the pt today  Patient requests refill of Zoloft 100 mg.  He has been taking this for many years dx of depression for ~20 years or so - strong family hx of depression, he cannot come off of it or has very severe sx.  He has no concerning SE.  Mood good.  Depression: [facility] 2/9 10/25/2018 12/28/2017 10/16/2017  Decreased Interest 0 0 0  Down, Depressed, Hopeless 0 0 0  PHQ - 2 Score 0 0 0  Altered sleeping 0 - 3  Tired,  decreased energy 0 - 2  Change in appetite 0 - 0  Feeling bad or failure about yourself  0 - 0  Trouble concentrating 0 - 1  Moving slowly or fidgety/restless 0 - 0  Suicidal thoughts 0 - 0  PHQ-9 Score 0 - 6  Difficult doing work/chores Not difficult at all - Somewhat difficult   Weight up - going to start a diet system this next week, usually helps him loose his weight. (nutrisystem)   Patient Active Problem List   Diagnosis Date Noted   Depression 11/27/2017   Obesity (BMI 30.0-34.9) 10/16/2017   Muscle fasciculation 08/17/2016   Medication monitoring encounter 08/17/2016   Leg cramps 03/01/2016   Dizziness 09/09/2015   Skin lesion of right upper extremity 01/12/2015   Contusion of toenail 01/12/2015   Insomnia 01/12/2015   Esophagitis, reflux 01/12/2015   Rib pain on left side 01/12/2015   Left renal mass 10/06/2014   Nephrolithiasis 10/06/2014   Left nephrolithiasis 08/17/2014   Drug-induced skin rash 08/17/2014   GERD without esophagitis 07/09/2014   Sleep apnea in adult 07/09/2014   Cervical spine disease 07/09/2014   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   Hyperlipidemia LDL goal <70 07/09/2014   History of myocardial infarct at age less than 60 years 07/09/2014   Coronary artery disease involving native coronary artery without angina pectoris 07/09/2014   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 07/09/2014  Major depressive disorder, recurrent episode, in partial remission (HCC) 07/09/2014   PAF (paroxysmal atrial fibrillation) (HCC) 06/16/2014    Past Surgical History:  Procedure Laterality Date   CORONARY ANGIOPLASTY WITH STENT PLACEMENT     EXTRACORPOREAL SHOCK WAVE LITHOTRIPSY Left 04/05/2017   Procedure: EXTRACORPOREAL SHOCK WAVE LITHOTRIPSY (ESWL);  Surgeon: Vanna Scotland, MD;  Location: [facility] ORS;  Service: Urology;  Laterality: Left;   reconstructed ear drum     SPINE SURGERY      TYMPANOSTOMY TUBE PLACEMENT     patient states he has had 6    Family History  Problem Relation Age of Onset   Cancer Mother    Depression Mother    Stroke Maternal Grandmother    Stroke Maternal Grandfather    Kidney disease Neg Hx    Prostate cancer Neg Hx     Social History   Socioeconomic History   Marital status: Married    Spouse name: Not on file   Number of children: Not on file   Years of education: Not on file   Highest education level: Not on file  Occupational History   Not on file  Social Needs   Financial resource strain: Not on file   Food insecurity    Worry: Not on file    Inability: Not on file   Transportation needs    Medical: Not on file    Non-medical: Not on file  Tobacco Use   Smoking status: Never Smoker   Smokeless tobacco: Never Used  Substance and Sexual Activity   Alcohol use: No    Alcohol/week: 0.0 standard drinks   Drug use: No   Sexual activity: Never  Lifestyle   Physical activity    Days per week: Not on file    Minutes per session: Not on file   Stress: Not on file  Relationships   Social connections    Talks on phone: Not on file    Gets together: Not on file    Attends religious service: Not on file    Active member of club or organization: Not on file    Attends meetings of clubs or organizations: Not on file    Relationship status: Not on file   Intimate partner violence    Fear of current or ex partner: Not on file    Emotionally abused: Not on file    Physically abused: Not on file    Forced sexual activity: Not on file  Other Topics Concern   Not on file  Social History Narrative   Not on file     Current Outpatient Medications:    aspirin 81 MG chewable tablet, Chew 81 mg by mouth., Disp: , Rfl:    omeprazole (PRILOSEC) 20 MG capsule, Take 1 capsule (20 mg total) by mouth daily., Disp: 30 capsule, Rfl: 3   rosuvastatin (CRESTOR) 5 MG tablet, Take 1 tablet (5 mg total) by mouth  at bedtime., Disp: , Rfl:    sertraline (ZOLOFT) 100 MG tablet, Take 1 tablet (100 mg total) by mouth daily., Disp: 90 tablet, Rfl: 3   ticagrelor (BRILINTA) 90 MG TABS tablet, Take 90 mg by mouth., Disp: , Rfl:    docusate sodium (COLACE) 100 MG capsule, Take 1 capsule (100 mg total) by mouth 2 (two) times daily. (Patient not taking: Reported on 10/25/2018), Disp: 60 capsule, Rfl: 0   meloxicam (MOBIC) 7.5 MG tablet, Take 1 tablet (7.5 mg total) by mouth daily. (Patient not taking: Reported on 10/25/2018), Disp:  30 tablet, Rfl: 0   tamsulosin (FLOMAX) 0.4 MG CAPS capsule, Take 1 capsule (0.4 mg total) by mouth daily. (Patient not taking: Reported on 12/28/2017), Disp: 30 capsule, Rfl: 0  No Known Allergies  I personally reviewed active problem list, medication list, allergies, notes from last encounter, lab results with the patient/caregiver today.  Review of Systems  Constitutional: Negative.   HENT: Negative.   Eyes: Negative.   Respiratory: Negative.   Cardiovascular: Negative.   Gastrointestinal: Negative.   Endocrine: Negative.   Genitourinary: Negative.   Musculoskeletal: Negative.   Skin: Negative.   Allergic/Immunologic: Negative.   Neurological: Negative.   Hematological: Negative.   Psychiatric/Behavioral: Negative.   All other systems reviewed and are negative.     Objective:    Virtual encounter, vitals limited, only able to obtain the following There were no vitals filed for this visit. There is no height or weight on file to calculate BMI. Nursing Note and Vital Signs reviewed.  Physical Exam Vitals signs and nursing note reviewed.  Constitutional:      General: He is not in acute distress.    Appearance: He is well-developed. He is obese. He is not ill-appearing, toxic-appearing or diaphoretic.  HENT:     Head: Normocephalic and atraumatic.     Nose: Nose normal.  Eyes:     General:        Right eye: No discharge.        Left eye: No discharge.      Conjunctiva/sclera: Conjunctivae normal.  Neck:     Trachea: No tracheal deviation.  Cardiovascular:     Rate and Rhythm: Normal rate and regular rhythm.  Pulmonary:     Effort: Pulmonary effort is normal. No respiratory distress.     Breath sounds: No stridor.  Musculoskeletal: Normal range of motion.  Skin:    General: Skin is warm and dry.     Findings: No rash.  Neurological:     Mental Status: He is alert.     Motor: No abnormal muscle tone.     Coordination: Coordination normal.  Psychiatric:        Attention and Perception: Attention normal.        Mood and Affect: Mood is not anxious or depressed. Affect is flat. Affect is not tearful.        Speech: Speech normal.        Behavior: Behavior is agitated.        Thought Content: Thought content normal. Thought content does not include homicidal or suicidal ideation. Thought content does not include homicidal or suicidal plan.     PE limited by telephone encounter  No results found for this or any previous visit (from the past 72 hour(s)).  PHQ2/9: Depression screen [facility] 2/9 10/25/2018 12/28/2017 10/16/2017 08/17/2016 01/20/2016  Decreased Interest 0 0 0 0 0  Down, Depressed, Hopeless 0 0 0 0 0  PHQ - 2 Score 0 0 0 0 0  Altered sleeping 0 - 3 - -  Tired, decreased energy 0 - 2 - -  Change in appetite 0 - 0 - -  Feeling bad or failure about yourself  0 - 0 - -  Trouble concentrating 0 - 1 - -  Moving slowly or fidgety/restless 0 - 0 - -  Suicidal thoughts 0 - 0 - -  PHQ-9 Score 0 - 6 - -  Difficult doing work/chores Not difficult at all - Somewhat difficult - -   PHQ-2/9 Result is negative.  Fall Risk: Fall Risk  10/25/2018 12/28/2017 10/16/2017 08/17/2016 01/20/2016  Falls in the past year? 0 0 No No No  Number falls in past yr: 0 0 - - -  Injury with Fall? 0 0 - - -     Assessment and Plan:   1. Major depressive disorder, recurrent episode, in full remission (HCC) Refill on meds, PHQ negative, well controlled,  no concerning med SE - sertraline (ZOLOFT) 100 MG tablet; Take 1 tablet (100 mg total) by mouth daily.  Dispense: 90 tablet; Refill: 3  2. PAF (paroxysmal atrial fibrillation) [facility]) Per cardiology [facility] - will need further refills through his managing specialists - ticagrelor (BRILINTA) 90 MG TABS tablet; Take 1 tablet (90 mg total) by mouth 2 (two) times daily.  Dispense: 60 tablet; Refill: 0 - Comprehensive metabolic panel; Future - CBC with Differential/Platelet; Future  3. Hyperlipidemia LDL goal <70 Compliant, no diet efforts, usually prescribed by cardiology Needs labs - rosuvastatin (CRESTOR) 5 MG tablet; Take 1 tablet (5 mg total) by mouth at bedtime.  Dispense: 90 tablet; Refill: 1 - Comprehensive metabolic panel; Future - Lipid panel; Future  4. Medication monitoring encounter - Comprehensive metabolic panel; Future - Lipid panel; Future - Hemoglobin A1c; Future - TSH; Future - CBC with Differential/Platelet; Future  5. Obesity due to excess calories with serious comorbidity, unspecified classification Working on diet and exercise, encouraged to f/up if he would like to discuss any medical weight management or get referral Due for all labs - He will do at [facility] near where he lives -ordered through [facility]- future orders, will mail requisition pages to the patient - Comprehensive metabolic panel; Future - Lipid panel; Future - Hemoglobin A1c; Future - TSH; Future    I discussed the assessment and treatment plan with the patient. The patient was provided an opportunity to ask questions and all were answered. The patient agreed with the plan and demonstrated an understanding of the instructions.  The patient was advised to call back or seek an in-person evaluation if the symptoms worsen or if the condition fails to improve as anticipated.  I provided 12 minutes of non-face-to-face time during this encounter.  Danelle Berry, PA-C 10/02/203:29 PM

## 2018-10-25 NOTE — Progress Notes (Signed)
12:26 PM   Melvin MadeiraJason Hatfield 07/24/1970 [id number]  Referring provider: Kerman Hatfield, Melvin P, MD [address] [place] [id number]  Chief Complaint  Patient presents with  . Other    HPI: 48 yo male with BPH w LU TS, nephrolithiasis, a history of hematuria and a Bosniak 2 renal cyst who presents today for follow up.  BPH WITH LUTS  (prostate and/or bladder) IPSS score: 13/2   PVR: 0 mL  Previous score: 8/1  Previous PVR: 54 mL   Major complaint(s):  None.  Denies any dysuria, hematuria or suprapubic pain.   His has had cystoscopy in 2017 which noted enlarged prostate, elevated bladder neck and a mass effect of the prostate to the bladder.    Denies any recent fevers, chills, nausea or vomiting.  He does not have a family history of PCa.  IPSS    Row Name 10/28/18 0900         International Prostate Symptom Score   How often have you had the sensation of not emptying your bladder?  Less than half the time     How often have you had to urinate less than every two hours?  About half the time     How often have you found you stopped and started again several times when you urinated?  Less than half the time     How often have you found it difficult to postpone urination?  Less than half the time     How often have you had a weak urinary stream?  About half the time     How often have you had to strain to start urination?  Less than 1 in 5 times     How many times did you typically get up at night to urinate?  None     Total IPSS Score  13       Quality of Life due to urinary symptoms   If you were to spend the rest of your life with your urinary condition just the way it is now how would you feel about that?  Mostly Satisfied         Nephrolithiasis CT was performed in 03/2015 and it noted bilateral renal calculi likely accounting for the patient's microhematuria. No obstructing ureteral calculi or bladder calculi.  Lower pole left renal cyst. No worrisome renal  lesions and no bladder abnormality.  No other significant abdominal/pelvic findings.  Patient's KUB taken on 11/13/2016 noted a left lower pole 11 mm nephrolithiasis which is stable. The previous punctate right lower pole stone was unable to be visualized.    CT Renal stone study performed on 03/21/2017 noted bilateral nonobstructing renal calculi, measuring up to 5 mm in the left lower pole, as above. No ureteral or bladder calculi. No hydronephrosis.  15 mm left lower pole renal cyst, benign (Bosniak I).   KUB taken on 04/05/2017 noted an unchanged left lower pole renal calculi.    He underwent left ESWL on 04/05/2017 with Dr. Apolinar Hatfield.  The procedure was uneventful and as expected.  KUB taken 04/16/2017 noted punctate stone fragments remaining in the lower pole of the left kidney. No evidence of stones elsewhere.  Stone composition 74% Calcium Oxalate Monohydrate, 23% Calcium Oxalate Dihydrate and 3% Calcium Phosphate Carbonate.   RUS 04/2017 revealed bilateral nephrolithiasis. No hydronephrosis or renal obstruction is noted.  16 mm septated cyst is seen in midpole of left kidney consistent with Bosniak type 2 lesion. Follow-up  ultrasound in 12 months is recommended to ensure stability.  History of hematuria Incidental on UA. No gross hematuria or flank pain.  CT urogram on 04/12/2015 reviewed personally. This reveals bilateral small nonobstructing calculi. He does also have a known left lower pole renal cyst which was previously incompletely characterized, but CT urogram shows definitively that this is not a worrisome or enhancing lesion rather benign cyst.  Cystoscopy performed on 04/23/2015 with Dr. Erlene Hatfield noted mild trabeculation of the bladder with a slightly enlarged prostate.   He denies any gross hematuria.  He was unable to provide an UA at today's visit.    Left Bosniak type 2 lesion RUS on 10/18/2018 revealed an 8.2 mm nonobstructive stone in the lower left kidney.  There is a complex cyst  in the midpole of the left kidney measuring 1.6 x 1.8 x 1.5 cm today versus 1.6 x 1.6 x 1.6 cm on the April 26, 2017 ultrasound, up to 1.5 cm on the March 21, 2017 CT scan and up to 1.1 cm on the CT scan from April 12, 2015. This mass is nonspecific on today's study. Given interval growth, recommend an MRI for more complete assessment.   PMH: Past Medical History:  Diagnosis Date  . Allergy   . Arthritis   . Coronary artery disease involving native coronary artery without angina pectoris   . Depression   . GERD (gastroesophageal reflux disease)   . Hyperlipidemia   . Left nephrolithiasis 08/17/2014  . Major depressive disorder, recurrent episode, in partial remission ([place]) 07/09/2014   Doing well on current dosage of Sertraline 100mg .   . Myocardial infarction ([place])   . Sleep apnea     Surgical History: Past Surgical History:  Procedure Laterality Date  . CORONARY ANGIOPLASTY WITH STENT PLACEMENT    . EXTRACORPOREAL SHOCK WAVE LITHOTRIPSY Left 04/05/2017   Procedure: EXTRACORPOREAL SHOCK WAVE LITHOTRIPSY (ESWL);  Surgeon: Melvin Espy, MD;  Location: [facility] ORS;  Service: Urology;  Laterality: Left;  . reconstructed ear drum    . SPINE SURGERY    . TYMPANOSTOMY TUBE PLACEMENT     patient states he has had 6    Home Medications:  Allergies as of 10/28/2018   No Known Allergies     Medication List       Accurate as of October 28, 2018 11:59 PM. If you have any questions, ask your nurse or doctor.        STOP taking these medications   docusate sodium 100 MG capsule Commonly known as: COLACE Stopped by: Melvin Cerro, PA-C   tamsulosin 0.4 MG Caps capsule Commonly known as: Flomax Stopped by: Melvin Council, PA-C     TAKE these medications   aspirin 81 MG chewable tablet Chew 81 mg by mouth.   omeprazole 20 MG capsule Commonly known as: PRILOSEC Take 1 capsule (20 mg total) by mouth daily.   rosuvastatin 5 MG tablet Commonly known as: Crestor Take 1 tablet  (5 mg total) by mouth at bedtime.   sertraline 100 MG tablet Commonly known as: ZOLOFT Take 1 tablet (100 mg total) by mouth daily.   ticagrelor 90 MG Tabs tablet Commonly known as: Brilinta Take 1 tablet (90 mg total) by mouth 2 (two) times daily.       Allergies: No Known Allergies  Family History: Family History  Problem Relation Age of Onset  . Cancer Mother   . Depression Mother   . Stroke Maternal Grandmother   . Stroke Maternal Grandfather   .  Kidney disease Neg Hx   . Prostate cancer Neg Hx     Social History:  reports that he has never smoked. He has never used smokeless tobacco. He reports that he does not drink alcohol or use drugs.  ROS: UROLOGY Frequent Urination?: No Hard to postpone urination?: No Burning/pain with urination?: No Get up at night to urinate?: No Leakage of urine?: No Urine stream starts and stops?: No Trouble starting stream?: No Do you have to strain to urinate?: No Blood in urine?: No Urinary tract infection?: No Sexually transmitted disease?: No Injury to kidneys or bladder?: No Painful intercourse?: No Weak stream?: No Erection problems?: No Penile pain?: No Gastrointestinal Nausea?: No Vomiting?: No Indigestion/heartburn?: No Diarrhea?: No Constipation?: No Constitutional Fever: No Night sweats?: No Weight loss?: No Fatigue?: No Skin Skin rash/lesions?: No Itching?: No Eyes Blurred vision?: No Double vision?: No Ears/Nose/Throat Sore throat?: No Sinus problems?: No Hematologic/Lymphatic Swollen glands?: No Easy bruising?: No Cardiovascular Leg swelling?: No Chest pain?: No Respiratory Cough?: No Shortness of breath?: No Endocrine Excessive thirst?: No Musculoskeletal Back pain?: No Joint pain?: No Neurological Headaches?: No Dizziness?: No Psychologic Depression?: Yes Anxiety?: No   Physical Exam: Blood pressure 126/82, pulse (!) 52, height 5\' 10"  (1.778 m), weight 235 lb (106.6  kg). Constitutional:  Well nourished. Alert and oriented, No acute distress. HEENT: [place] AT, moist mucus membranes.  Trachea midline, no masses. Cardiovascular: No clubbing, cyanosis, or edema. Respiratory: Normal respiratory effort, no increased work of breathing. Neurologic: Grossly intact, no focal deficits, moving all 4 extremities. Psychiatric: Normal mood and affect. Patient deferred GU and rectal exam.    Laboratory Data: PSA 0.7 ng/mL on 10/07/2015 PSA 0.9 ng/mL on 11/06/2016 PSA 0.90 ng/mL on 10/18/2018   I have reviewed the labs.  Pertinent Imaging CLINICAL DATA:  History of kidney stones.  EXAM: RENAL / URINARY TRACT ULTRASOUND COMPLETE  COMPARISON:  April 26, 2017  FINDINGS: Right Kidney:  Renal measurements: 10.8 x 6.1 x 5.9 cm = volume: 201.1 mL . Echogenicity within normal limits. No mass or hydronephrosis visualized.  Left Kidney:  Renal measurements: 10.7 x 6.4 x 5.9 cm = volume: 211.1 mL. Contains an 8.2 mm nonobstructive stone. No hydronephrosis. A complex cyst measuring 1.6 x 1.8 x 1.5 cm in the mid pole of the left kidney was previously described as a Bosniak 2 lesion cyst on the previous ultrasound and a cyst on the CT scan from March 21, 2017.  Bladder:  Appears normal for degree of bladder distention.  IMPRESSION: 1. 8.2 mm nonobstructive stone in the lower left kidney. 2. There is a complex cyst in the midpole of the left kidney measuring 1.6 x 1.8 x 1.5 cm today versus 1.6 x 1.6 x 1.6 cm on the April 26, 2017 ultrasound, up to 1.5 cm on the March 21, 2017 CT scan and up to 1.1 cm on the CT scan from April 12, 2015. This mass is nonspecific on today's study. Given interval growth, recommend an MRI for more complete assessment.  These results will be called to the ordering clinician or representative by the Radiologist Assistant, and communication documented in the PACS or zVision Dashboard.   Electronically Signed   By:  April 14, 2015 III M.D   On: 10/18/2018 20:59 I have independently reviewed the films and will obtain an MRI for further evaluation.   Assessment & Plan:    1. BPH with LUTS IPSS score is 13/2, it is worsening Continue conservative management, avoiding bladder irritants  and timed voiding's Most bothersome symptoms is/are frequency and a weak stream, but he does not want to pursue further evaluation or treatment  RTC in 12 months for IPSS, PSA, PVR and exam   2. History of hematuria Completed a hematuria work up in 03/2015 - no worrisome findings UA will check at next appointment  No reports of gross hematuria  3. Complex left renal cyst Interval growth noted, will obtain a MRI of the lesion at this time RTC pending MRI results   4. History of nephrolithiasis No stones seen on recent RUS   Return for MRI report .  Claudett Bayly, PA-C

## 2018-10-28 ENCOUNTER — Other Ambulatory Visit: Payer: Self-pay | Admitting: Urology

## 2018-10-28 ENCOUNTER — Ambulatory Visit: Payer: BC Managed Care – PPO | Admitting: Urology

## 2018-10-28 ENCOUNTER — Other Ambulatory Visit: Payer: Self-pay

## 2018-10-28 ENCOUNTER — Ambulatory Visit: Payer: BC Managed Care – PPO | Admitting: Family Medicine

## 2018-10-28 ENCOUNTER — Encounter: Payer: Self-pay | Admitting: Urology

## 2018-10-28 VITALS — BP 126/82 | HR 52 | Ht 70.0 in | Wt 235.0 lb

## 2018-10-28 DIAGNOSIS — N138 Other obstructive and reflux uropathy: Secondary | ICD-10-CM

## 2018-10-28 DIAGNOSIS — Z87442 Personal history of urinary calculi: Secondary | ICD-10-CM

## 2018-10-28 DIAGNOSIS — I480 Paroxysmal atrial fibrillation: Secondary | ICD-10-CM

## 2018-10-28 DIAGNOSIS — N281 Cyst of kidney, acquired: Secondary | ICD-10-CM

## 2018-10-28 DIAGNOSIS — N401 Enlarged prostate with lower urinary tract symptoms: Secondary | ICD-10-CM

## 2018-10-28 DIAGNOSIS — Z5181 Encounter for therapeutic drug level monitoring: Secondary | ICD-10-CM

## 2018-10-28 DIAGNOSIS — E785 Hyperlipidemia, unspecified: Secondary | ICD-10-CM

## 2018-10-28 DIAGNOSIS — E6609 Other obesity due to excess calories: Secondary | ICD-10-CM

## 2018-10-28 DIAGNOSIS — Z87448 Personal history of other diseases of urinary system: Secondary | ICD-10-CM

## 2018-10-28 LAB — BLADDER SCAN AMB NON-IMAGING: Scan Result: 0

## 2018-11-08 ENCOUNTER — Other Ambulatory Visit: Payer: Self-pay

## 2018-11-08 ENCOUNTER — Ambulatory Visit
Admission: RE | Admit: 2018-11-08 | Discharge: 2018-11-08 | Disposition: A | Discharge status: Home / Self Care | Payer: BC Managed Care – PPO | Source: Ambulatory Visit | Attending: Urology | Admitting: Urology

## 2018-11-08 DIAGNOSIS — N281 Cyst of kidney, acquired: Secondary | ICD-10-CM

## 2018-11-08 IMAGING — MR MR ABDOMEN WO/W CM
20 series · 48 of 48 positions shown · IV contrast (gadavist)
Comparison: No prior abdominal MRI. Renal ultrasound [DATE]. CT
the abdomen and pelvis [DATE].

CLINICAL DATA: 47-year-old male with history of indeterminate
complex cystic lesion in the interpolar region of the left kidney.
Followup study.

EXAM:
MRI ABDOMEN WITHOUT AND WITH CONTRAST
TECHNIQUE: Multiplanar multisequence MR imaging of the abdomen was performed
both before and after the administration of intravenous contrast.
CONTRAST:  10mL GADAVIST GADOBUTROL 1 MMOL/ML IV SOLN

[Series 3: cor haste · coronal · 6.0mm · 1.19mm/px · 2 of 32 slices shown]
[im 1/32]
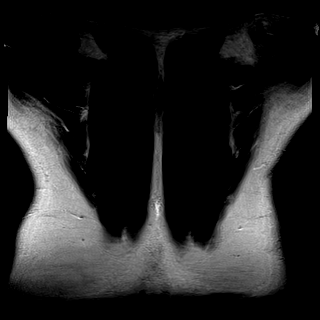
[im 32/32]
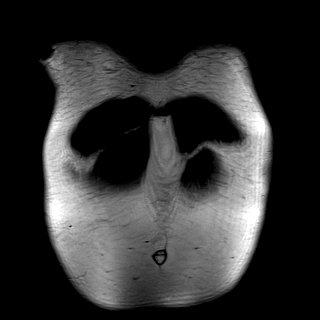

[Series 6: T2 fat-sat · axial · 6.0mm · 1.19mm/px · z∈[-50,+173]mm · 2 of 32 slices shown]
[im 1/32]
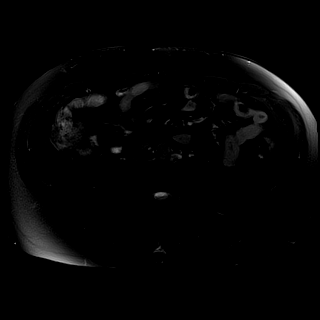
[im 32/32]
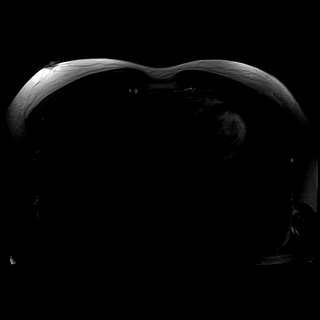

[Series 7: DWI · axial · 6.0mm · 1.42mm/px · z∈[-50,+173]mm · 2 of 32 slices shown (1 of 4)]
[im 1/32]
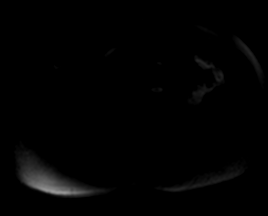
[im 32/32]
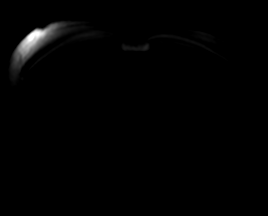

[Series 7: DWI · axial · 6.0mm · 1.42mm/px · z∈[-50,+173]mm · 2 of 32 slices shown (2 of 4)]
[im 1/32]
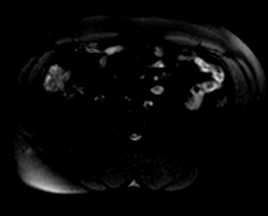
[im 32/32]
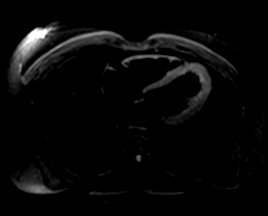

[Series 7: DWI · axial · 6.0mm · 1.42mm/px · 1 of 32 slices shown (3 of 4)]
[im 1/32]
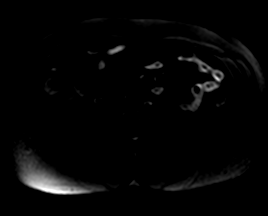

[Series 8: DWI · axial · 6.0mm · 1.42mm/px · 1 of 32 slices shown (4 of 4)]
[im 1/32]
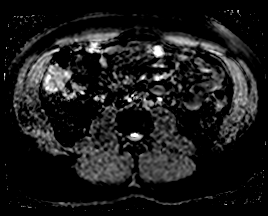

[Series 9: ax in & · axial · 3.0mm · 1.19mm/px · z∈[-56,+181]mm · 3 of 80 slices shown (1 of 2)]
[im 1/80]
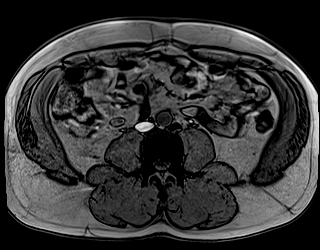
[im 40/80]
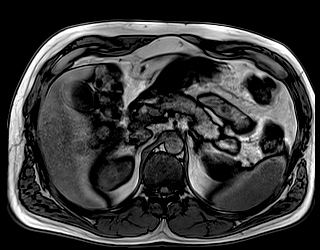
[im 80/80]
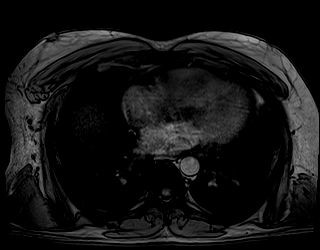

[Series 9: ax in & · axial · 3.0mm · 1.19mm/px · z∈[-56,+181]mm · 3 of 80 slices shown (2 of 2)]
[im 1/80]
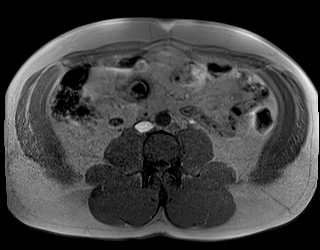
[im 40/80]
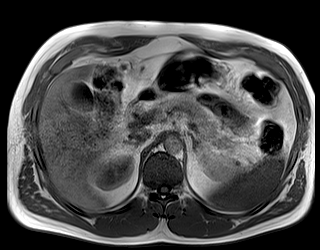
[im 80/80]
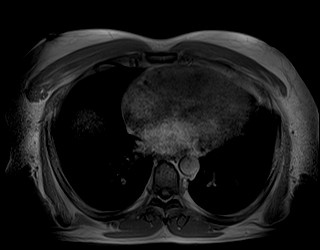

[Series 10: bSSFP · axial · 6.0mm · 0.74mm/px · 1 of 32 slices shown]
[im 1/32]
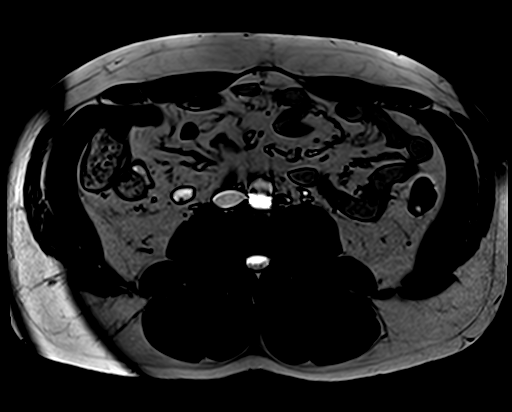

[Series 11: T2 · axial · 6.0mm · 1.19mm/px · 1 of 32 slices shown]
[im 1/32]
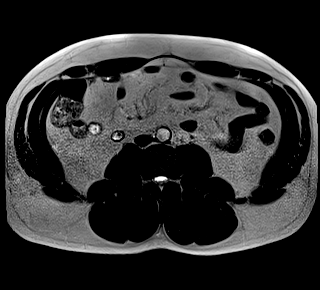

[Series 12: T1 dynamic · axial · non-contrast · 3.0mm · 1.19mm/px · z∈[-56,+181]mm · 3 of 80 slices shown (1 of 9)]
[im 1/80]
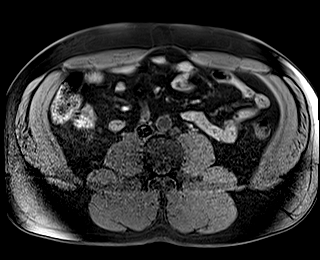
[im 40/80]
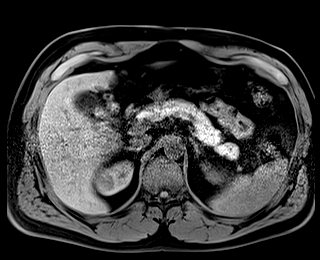
[im 80/80]
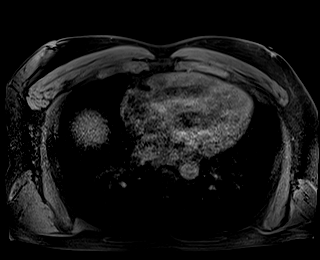

[Series 13: T1 dynamic · axial · 3.0mm · 1.19mm/px · z∈[-56,+181]mm · 3 of 80 slices shown (2 of 9)]
[im 1/80]
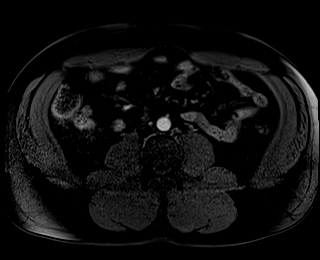
[im 40/80]
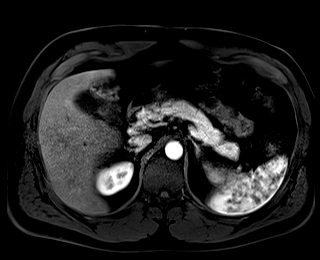
[im 80/80]
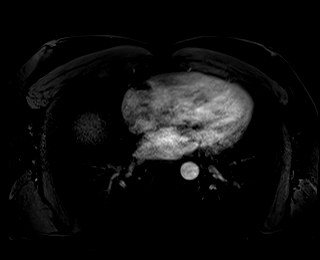

[Series 14: T1 dynamic · axial · 3.0mm · 1.19mm/px · z∈[-56,+181]mm · 3 of 80 slices shown (3 of 9)]
[im 1/80]
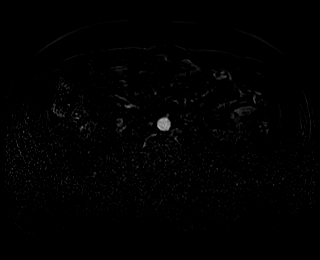
[im 40/80]
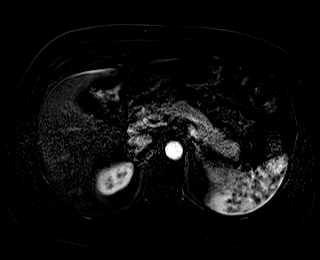
[im 80/80]
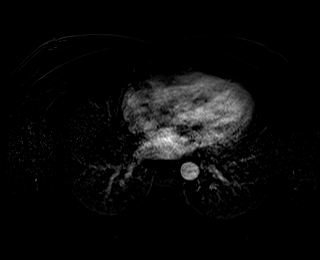

[Series 15: T1 dynamic · axial · 3.0mm · 1.19mm/px · z∈[-56,+181]mm · 3 of 80 slices shown (4 of 9)]
[im 1/80]
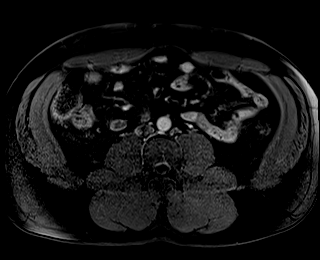
[im 40/80]
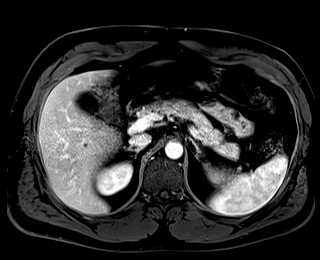
[im 80/80]
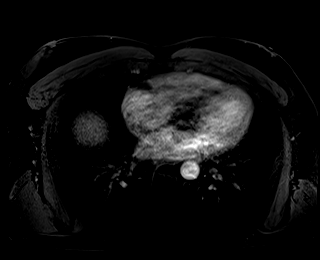

[Series 16: T1 dynamic · axial · 3.0mm · 1.19mm/px · z∈[-56,+181]mm · 3 of 80 slices shown (5 of 9)]
[im 1/80]
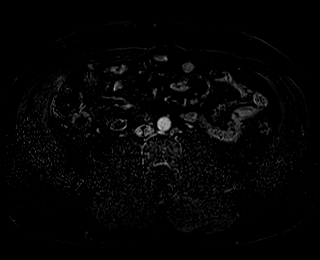
[im 40/80]
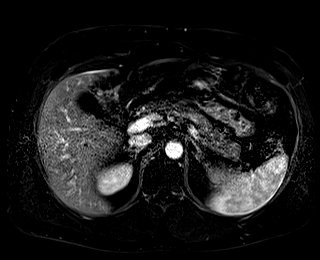
[im 80/80]
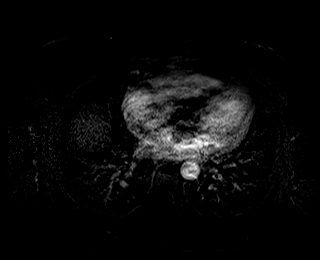

[Series 17: T1 dynamic · axial · 3.0mm · 1.19mm/px · z∈[-56,+181]mm · 3 of 80 slices shown (6 of 9)]
[im 1/80]
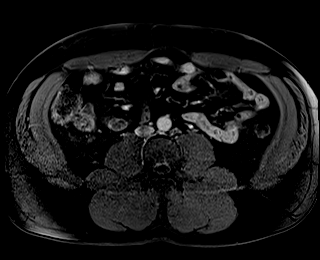
[im 40/80]
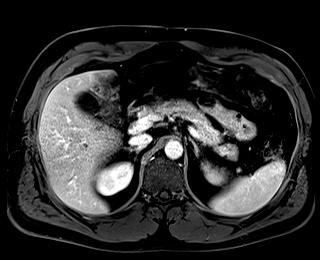
[im 80/80]
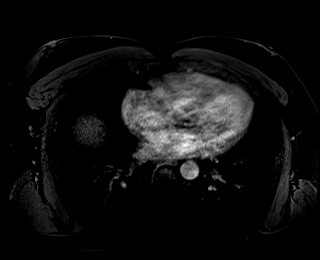

[Series 18: T1 dynamic · axial · 3.0mm · 1.19mm/px · z∈[-56,+181]mm · 3 of 80 slices shown (7 of 9)]
[im 1/80]
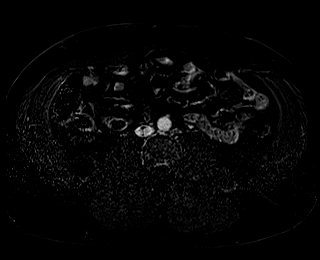
[im 40/80]
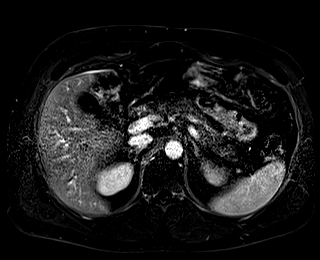
[im 80/80]
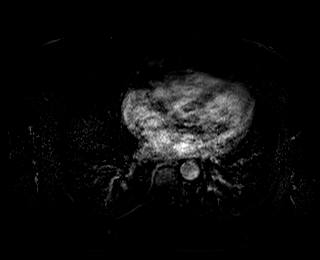

[Series 19: T1 dynamic post-contrast · coronal · 3.0mm · 1.31mm/px · 3 of 80 slices shown]
[im 1/80]
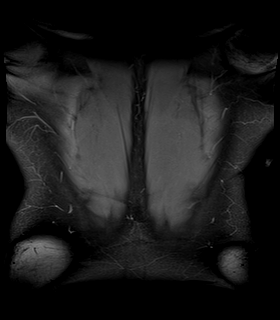
[im 40/80]
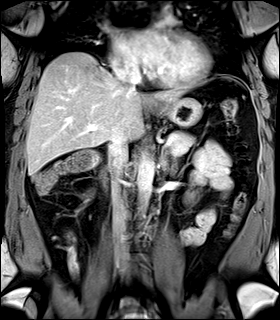
[im 80/80]
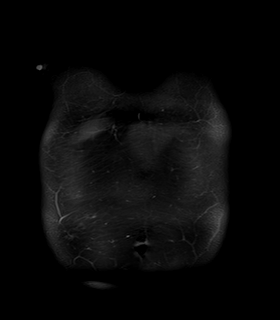

[Series 20: T1 dynamic · axial · 3.0mm · 1.19mm/px · z∈[-56,+181]mm · 3 of 80 slices shown (8 of 9)]
[im 1/80]
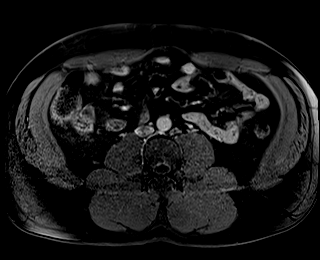
[im 40/80]
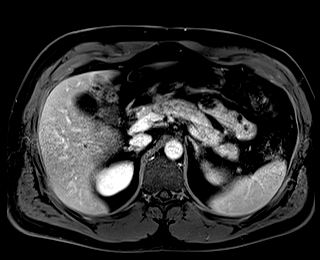
[im 80/80]
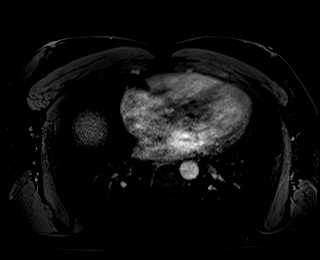

[Series 21: T1 dynamic · axial · 3.0mm · 1.19mm/px · z∈[-56,+181]mm · 3 of 80 slices shown (9 of 9)]
[im 1/80]
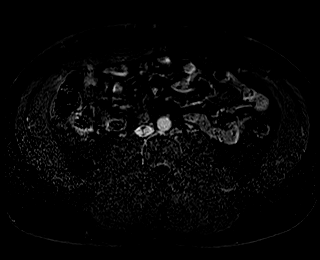
[im 40/80]
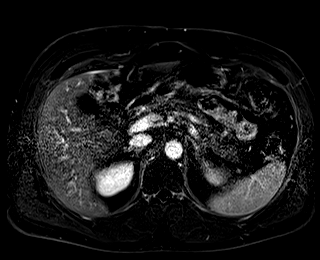
[im 80/80]
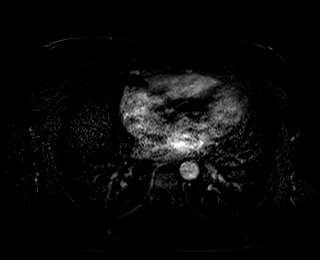

[48 of 48 positions shown; findings below may reference images not displayed]

FINDINGS: Lower chest: Unremarkable.

Hepatobiliary: Mild diffuse loss of signal intensity throughout the
hepatic parenchyma on out of phase dual echo images, indicative of
mild hepatic steatosis. 1 cm T1 hypointense, T2 hyperintense,
nonenhancing lesion in segment 2 of the liver is compatible with a
simple cyst. No other suspicious hepatic lesions. No intra or
extrahepatic biliary ductal dilatation. Gallbladder is normal in
appearance.

Pancreas: No pancreatic mass. No pancreatic ductal dilatation. No
pancreatic or peripancreatic fluid collections or inflammatory
changes.

Spleen:  Unremarkable.

Adrenals/Urinary Tract: The lesion of concern at junction of lower
and interpolar region of the left kidney is T1 hypointense, T2
hyperintense, demonstrates some internal septations which appear
mildly thickened along the lateral margin best appreciated on axial
image 25 of series 11, but without significant internal enhancement.
This lesion currently measures 1.9 x 1.6 x 1.7 cm (axial image 26 of
series 11 and coronal image 14 of series 3). No definite restriction
of diffusion. This lesion is encapsulated within Gerota's fascia and
is well separated from the left renal vein which is widely patent at
this time. Right kidney and bilateral adrenal glands are normal in
appearance. No hydroureteronephrosis in the visualized portions of
the abdomen.

Stomach/Bowel: Visualized portions are unremarkable.

Vascular/Lymphatic: No aneurysm identified in the visualized
abdominal vasculature. Left renal vein is widely patent. No
lymphadenopathy noted in the abdomen or pelvis.

Other: No significant volume of ascites noted in the visualized
portions of the peritoneal cavity.

Musculoskeletal: No aggressive appearing osseous lesions are noted
in the visualized portions of the skeleton.
IMPRESSION: 1. 1.9 x 1.6 x 1.7 cm complex cystic lesion at junction of lower
pole and interpolar region of the left kidney with imaging
characteristics categorized as Bosniak class 2F cyst. Repeat
abdominal MRI with and without IV gadolinium is recommended in 6
months to ensure stability of this lesion. This recommendation
follows ACR consensus guidelines: Management of the Incidental Renal
Mass on CT: A White Paper of the ACR Incidental Findings Committee.
[HOSPITAL] [3S];[DATE].
2. Mild hepatic steatosis.

## 2018-11-08 MED ORDER — GADOBUTROL 1 MMOL/ML IV SOLN
10.0000 mL | Freq: Once | INTRAVENOUS | Status: AC | PRN
  Administered 2018-11-08: 10 mL via INTRAVENOUS

## 2018-11-11 ENCOUNTER — Other Ambulatory Visit: Payer: Self-pay | Admitting: Urology

## 2018-11-11 DIAGNOSIS — N281 Cyst of kidney, acquired: Secondary | ICD-10-CM

## 2019-04-10 ENCOUNTER — Encounter: Payer: Self-pay | Admitting: Family Medicine

## 2019-04-19 ENCOUNTER — Encounter: Payer: Self-pay | Admitting: Family Medicine

## 2019-04-21 ENCOUNTER — Encounter: Payer: Self-pay | Admitting: Family Medicine

## 2019-04-21 ENCOUNTER — Telehealth (INDEPENDENT_AMBULATORY_CARE_PROVIDER_SITE_OTHER): Payer: BC Managed Care – PPO | Admitting: Family Medicine

## 2019-04-21 VITALS — Ht 70.0 in | Wt 225.0 lb

## 2019-04-21 DIAGNOSIS — F339 Major depressive disorder, recurrent, unspecified: Secondary | ICD-10-CM | POA: Diagnosis not present

## 2019-04-21 DIAGNOSIS — G4733 Obstructive sleep apnea (adult) (pediatric): Secondary | ICD-10-CM

## 2019-04-21 DIAGNOSIS — G4700 Insomnia, unspecified: Secondary | ICD-10-CM | POA: Diagnosis not present

## 2019-04-21 DIAGNOSIS — Z9989 Dependence on other enabling machines and devices: Secondary | ICD-10-CM

## 2019-04-21 DIAGNOSIS — E785 Hyperlipidemia, unspecified: Secondary | ICD-10-CM

## 2019-04-21 DIAGNOSIS — Z5181 Encounter for therapeutic drug level monitoring: Secondary | ICD-10-CM

## 2019-04-21 MED ORDER — SERTRALINE HCL 25 MG PO TABS: ORAL_TABLET | ORAL | 1 refills | Status: DC

## 2019-04-21 MED ORDER — ROSUVASTATIN CALCIUM 5 MG PO TABS: 5.0000 mg | ORAL_TABLET | Freq: Every day | ORAL | 3 refills | Status: DC

## 2019-04-21 NOTE — Progress Notes (Signed)
Patient ID: Melvin Hatfield, male    DOB: 1970-11-14, 49 y.o.   MRN: [id number]  PCP: Delsa Grana, PA-C  Chief Complaint  Patient presents with  . Depression    dicuss sertaline medication, patient having trouble sleeping and concentrating  . Insomnia   I connected with  Ezra Sites  on 04/21/2019 at 8:00 am by a video enabled telemedicine application and verified that I am speaking with the correct person using two identifiers.  I discussed the limitations of evaluation and management by telemedicine and the availability of in person appointments. The patient expressed understanding and agreed to proceed. Staff also discussed with the patient that there may be a patient responsible charge related to this service. Patient Location: home Provider Location: [facility] clinic Additional Individuals present: none  Subjective:   Melvin Hatfield is a 49 y.o. male, presents to clinic with CC of the following:  Pt presents for routine f/up, due for labs, none for over 1-2 years. He also is concerned about insomnia  He reports that he has sleep apnea and trouble sleeping, constantly tired.  Notes that he is compliant with CPAP and he feels everything is fitting well and working well, no recent weight increase, some weight loss.  He has tried working on sleep hygiene - tried reading before bed, turning off screens, staying off caffeine  Sleep a problem, he has been dealing with insomnia for several years, typically tries to sleep from 1am-8am, he endorses trouble falling asleep, no problem staying asleep.  He has tried OTC meds, trazodone. Tried PM OTC meds, they make him jittery but not help him go to sleep. Feeling more depressed, not sure if its from not sleeping as well?  PHQ 9 is positive, mild depressive sx, trouble concentrating, otherwise increased score is secondary (with 3/3) to sleep and energy problems.  No SI.  Pt denies any increased anxiety, no recent events or stressors.  He has been taking  zoloft 100 mg for many years.   Effexor in the past, been on meds for 20 years or so Depression screen [facility] 2/9 04/21/2019 10/25/2018 12/28/2017  Decreased Interest 1 0 0  Down, Depressed, Hopeless 1 0 0  PHQ - 2 Score 2 0 0  Altered sleeping 3 0 -  Tired, decreased energy 3 0 -  Change in appetite 0 0 -  Feeling bad or failure about yourself  0 0 -  Trouble concentrating 1 0 -  Moving slowly or fidgety/restless 0 0 -  Suicidal thoughts 0 0 -  PHQ-9 Score 9 0 -  Difficult doing work/chores Somewhat difficult Not difficult at all -   Pt notes that with his cardiac conditions he has some exercise limitations, he does get about "20 hours of week exercise" he doesn't feel like he needs to do any more.  Lost 10 lbs since last visit  [facility] -  brilinta for A fib - he has seen them recently, but no recent labs done - he does live in [place].    Hyperlipidemia: Current Medication Regimen:  crestor 5 mg No labs for several years Last Lipids: Lab Results  Component Value Date   CHOL 215 (H) 08/17/2016   HDL 45 08/17/2016   LDLCALC 122 (H) 08/17/2016   TRIG 239 (H) 08/17/2016   CHOLHDL 4.8 08/17/2016   - Denies: Chest pain, shortness of breath, myalgias. - Documented aortic atherosclerosis? No No more recent labs per care everywhere. Pt agrees to do  labs locally if orders are mailed to him.       Patient Active Problem List   Diagnosis Date Noted  . Depression 11/27/2017  . Obesity (BMI 30.0-34.9) 10/16/2017  . Muscle fasciculation 08/17/2016  . Medication monitoring encounter 08/17/2016  . Leg cramps 03/01/2016  . Dizziness 09/09/2015  . Skin lesion of right upper extremity 01/12/2015  . Contusion of toenail 01/12/2015  . Insomnia 01/12/2015  . Esophagitis, reflux 01/12/2015  . Rib pain on left side 01/12/2015  . Left renal mass 10/06/2014  . Nephrolithiasis 10/06/2014  . Left nephrolithiasis 08/17/2014  . Drug-induced skin rash 08/17/2014  .  GERD without esophagitis 07/09/2014  . Sleep apnea in adult 07/09/2014  . 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 Hyperlipidemia LDL goal <70 07/09/2014  . History of myocardial infarct at age less than 60 years 07/09/2014  . Coronary artery disease involving native coronary artery without angina pectoris 07/09/2014  . 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 Major depressive disorder, recurrent episode, in partial remission (HCC) 07/09/2014  . PAF (paroxysmal atrial fibrillation) (HCC) 06/16/2014      Current Outpatient Medications:  .  aspirin 81 MG chewable tablet, Chew 81 mg by mouth., Disp: , Rfl:  .  omeprazole (PRILOSEC) 20 MG capsule, Take 1 capsule (20 mg total) by mouth daily., Disp: 30 capsule, Rfl: 3 .  rosuvastatin (CRESTOR) 5 MG tablet, Take 1 tablet (5 mg total) by mouth at bedtime., Disp: 90 tablet, Rfl: 1 .  sertraline (ZOLOFT) 100 MG tablet, Take 1 tablet (100 mg total) by mouth daily., Disp: 90 tablet, Rfl: 3 .  ticagrelor (BRILINTA) 90 MG TABS tablet, Take 1 tablet (90 mg total) by mouth 2 (two) times daily., Disp: 60 tablet, Rfl: 0   No Known Allergies   Family History  Problem Relation Age of Onset  . Cancer Mother   . Depression Mother   . Stroke Maternal Grandmother   . Stroke Maternal Grandfather   . Kidney disease Neg Hx   . Prostate cancer Neg Hx      Social History   Socioeconomic History  . Marital status: Married    Spouse name: Not on file  . Number of children: Not on file  . Years of education: Not on file  . Highest education level: Not on file  Occupational History  . Not on file  Tobacco Use  . Smoking status: Never Smoker  . Smokeless tobacco: Never Used  Substance and Sexual Activity  . Alcohol use: No    Alcohol/week: 0.0 standard drinks  . Drug use: No  . Sexual activity: Never  Other Topics Concern  . Not on file  Social History  Narrative  . Not on file   Social Determinants of Health   Financial Resource Strain:   . Difficulty of Paying Living Expenses:   Food Insecurity:   . Worried About Programme researcher, broadcasting/film/video in the Last Year:   . Barista in the Last Year:   Transportation Needs:   . Freight forwarder (Medical):   Marland Kitchen Lack of Transportation (Non-Medical):   Physical Activity:   . Days of Exercise per Week:   . Minutes of Exercise per Session:   Stress:   . Feeling of Stress :   Social Connections:   . Frequency of Communication with Friends and Family:   . Frequency of Social Gatherings with Friends and Family:   .  Attends Religious Services:   . Active Member of Clubs or Organizations:   . Attends Banker Meetings:   Marland Kitchen Marital Status:   Intimate Partner Violence:   . Fear of Current or Ex-Partner:   . Emotionally Abused:   Marland Kitchen Physically Abused:   . Sexually Abused:     Chart Review Today: I personally reviewed active problem list, medication list, allergies, family history, social history, health maintenance, notes from last encounter, lab results, imaging with the patient/caregiver today.   Review of Systems 10 Systems reviewed and are negative for acute change except as noted in the HPI.     Objective:   Vitals:   04/21/19 0734  Weight: 225 lb (102.1 kg)  Height: 5\' 10"  (1.778 m)    Body mass index is 32.28 kg/m.  Physical Exam Vitals and nursing note reviewed.  Constitutional:      General: He is not in acute distress.    Appearance: He is obese. He is not ill-appearing, toxic-appearing or diaphoretic.     Comments: Tired appearing, pt in bed in dark room  HENT:     Head: Normocephalic and atraumatic.  Pulmonary:     Effort: No respiratory distress.  Neurological:     Mental Status: He is alert.  Psychiatric:        Behavior: Behavior normal.      Results for orders placed or performed in visit on 10/28/18  Bladder Scan (Post Void Residual) in  office  Result Value Ref Range   Scan Result 0         Assessment & Plan:      ICD-10-CM   1. Hyperlipidemia LDL goal <70  E78.5 rosuvastatin (CRESTOR) 5 MG tablet    Lipid panel    Comprehensive metabolic panel   no labs for over 2 years - states he will do if we mail them to him, will need to come in person in the next 6-12 months and do labs  2. Episode of recurrent major depressive disorder, unspecified depression episode severity (HCC)  F33.9 sertraline (ZOLOFT) 25 MG tablet   dose increase of zoloft, 100 up to 125 mg daily - f/up in ~6 weeks  3. Insomnia, unspecified type  G47.00    years of insomnia, no sig recent change, just more tired, sleep hygeine, zoloft dose adjustment, add exercise, therapy? possibly prn med?  4. OSA on CPAP  G47.33    Z99.89    states he is compliant with CPAP  5. Medication monitoring encounter  Z51.81 Lipid panel    Comprehensive metabolic panel    I discussed the assessment and treatment plan with the patient. The patient was provided an opportunity to ask questions and all were answered. The patient agreed with the plan and demonstrated an understanding of the instructions.  1-2 month f/up on med changes/insomnia  Pt may need to consider establishing with psych/therapist to help with insomnia, discussed SE and risk with several insomnia meds including sedation, dependence, withdrawal.  He reports failure in the past with trazodone and vistaril, OTC meds also ineffective.  Encouraged him to work on sleep hygiene, add exercise.aerobic as tolerated, other stress/anxiety coping skills.   I provided 30+  minutes of non-face-to-face time during this encounter, more than 18 min spent with pt on video encounter today    12/28/18, PA-C 04/21/19 8:15 AM

## 2019-04-21 NOTE — Patient Instructions (Addendum)
First line treatment in Cognitive Behavior Therapy  (CBI) CBI alone and CBI with insomnia meds are both more effective than meds alone   Meds I sent in are insomnia meds, they are controlled substances - there is that potential for addiction, sedation and dependence.  We discussed these risks, and I still strongly recommend therapy and establishing with someone locally for that - in general psychiatry will help with evaluation, adjusting medications and getting you connected with proper therapist.    When using meds for worse symptoms or increased anxiety or depression related to insomnia, they recommend using but monitoring closely and weaning off within 4-6 weeks.   So try to avoid daily use.  Make sure you take 30 min before bedtime, and you will need to push your bedtime up to closer to 10 pm.  You need more than 8 hours for meds to work.    Insomnia Insomnia is a sleep disorder that makes it difficult to fall asleep or stay asleep. Insomnia can cause fatigue, low energy, difficulty concentrating, mood swings, and poor performance at work or school. There are three different ways to classify insomnia:  Difficulty falling asleep.  Difficulty staying asleep.  Waking up too early in the morning. Any type of insomnia can be long-term (chronic) or short-term (acute). Both are common. Short-term insomnia usually lasts for three months or less. Chronic insomnia occurs at least three times a week for longer than three months. What are the causes? Insomnia may be caused by another condition, situation, or substance, such as:  Anxiety.  Certain medicines.  Gastroesophageal reflux disease (GERD) or other gastrointestinal conditions.  Asthma or other breathing conditions.  Restless legs syndrome, sleep apnea, or other sleep disorders.  Chronic pain.  Menopause.  Stroke.  Abuse of alcohol, tobacco, or illegal drugs.  Mental health conditions, such as  depression.  Caffeine.  Neurological disorders, such as Alzheimer's disease.  An overactive thyroid (hyperthyroidism). Sometimes, the cause of insomnia may not be known. What increases the risk? Risk factors for insomnia include:  Gender. Women are affected more often than men.  Age. Insomnia is more common as you get older.  Stress.  Lack of exercise.  Irregular work schedule or working night shifts.  Traveling between different time zones.  Certain medical and mental health conditions. What are the signs or symptoms? If you have insomnia, the main symptom is having trouble falling asleep or having trouble staying asleep. This may lead to other symptoms, such as:  Feeling fatigued or having low energy.  Feeling nervous about going to sleep.  Not feeling rested in the morning.  Having trouble concentrating.  Feeling irritable, anxious, or depressed. How is this diagnosed? This condition may be diagnosed based on:  Your symptoms and medical history. Your health care provider may ask about: ? Your sleep habits. ? Any medical conditions you have. ? Your mental health.  A physical exam. How is this treated? Treatment for insomnia depends on the cause. Treatment may focus on treating an underlying condition that is causing insomnia. Treatment may also include:  Medicines to help you sleep.  Counseling or therapy.  Lifestyle adjustments to help you sleep better. Follow these instructions at home: Eating and drinking   Limit or avoid alcohol, caffeinated beverages, and cigarettes, especially close to bedtime. These can disrupt your sleep.  Do not eat a large meal or eat spicy foods right before bedtime. This can lead to digestive discomfort that can make it hard for you to  sleep. Sleep habits   Keep a sleep diary to help you and your health care provider figure out what could be causing your insomnia. Write down: ? When you sleep. ? When you wake up during  the night. ? How well you sleep. ? How rested you feel the next day. ? Any side effects of medicines you are taking. ? What you eat and drink.  Make your bedroom a dark, comfortable place where it is easy to fall asleep. ? Put up shades or blackout curtains to block light from outside. ? Use a white noise machine to block noise. ? Keep the temperature cool.  Limit screen use before bedtime. This includes: ? Watching TV. ? Using your smartphone, tablet, or computer.  Stick to a routine that includes going to bed and waking up at the same times every day and night. This can help you fall asleep faster. Consider making a quiet activity, such as reading, part of your nighttime routine.  Try to avoid taking naps during the day so that you sleep better at night.  Get out of bed if you are still awake after 15 minutes of trying to sleep. Keep the lights down, but try reading or doing a quiet activity. When you feel sleepy, go back to bed. General instructions  Take over-the-counter and prescription medicines only as told by your health care provider.  Exercise regularly, as told by your health care provider. Avoid exercise starting several hours before bedtime.  Use relaxation techniques to manage stress. Ask your health care provider to suggest some techniques that may work well for you. These may include: ? Breathing exercises. ? Routines to release muscle tension. ? Visualizing peaceful scenes.  Make sure that you drive carefully. Avoid driving if you feel very sleepy.  Keep all follow-up visits as told by your health care provider. This is important. Contact a health care provider if:  You are tired throughout the day.  You have trouble in your daily routine due to sleepiness.  You continue to have sleep problems, or your sleep problems get worse. Get help right away if:  You have serious thoughts about hurting yourself or someone else. If you ever feel like you may hurt  yourself or others, or have thoughts about taking your own life, get help right away. You can go to your nearest emergency department or call:  Your local emergency services (911 in the [place]  A suicide crisis helpline, such as the [place] at 410-528-8394. This is open 24 hours a day. Summary  Insomnia is a sleep disorder that makes it difficult to fall asleep or stay asleep.  Insomnia can be long-term (chronic) or short-term (acute).  Treatment for insomnia depends on the cause. Treatment may focus on treating an underlying condition that is causing insomnia.  Keep a sleep diary to help you and your health care provider figure out what could be causing your insomnia. This information is not intended to replace advice given to you by your health care provider. Make sure you discuss any questions you have with your health care provider. Document Revised: 12/22/2016 Document Reviewed: 10/19/2016 Elsevier Patient Education  2020 [place] a consistent sleep schedule.  Set a bedtime that is early enough for you to get at least 7 hours of sleep. Don't go to bed unless you are sleepy.  If you don't fall asleep after 20 minutes, get out of bed.  Establish a relaxing bedtime routine.  Use your bed only for sleep.  Make your bedroom quiet and relaxing. Keep the room at a comfortable, cool temperature.  Limit exposure to bright light in the evenings. Turn off electronic devices at least 30 minutes before bedtime. Don't eat a large meal before bedtime. If you are hungry at night, eat a light, healthy snack.  Exercise regularly and maintain a healthy diet.  Avoid consuming caffeine in the late afternoon or evening.  Avoid consuming alcohol before bedtime.  Reduce your fluid intake before bedtime.

## 2019-04-23 MED ORDER — SUVOREXANT 10 MG PO TABS: 10.0000 mg | ORAL_TABLET | Freq: Every evening | ORAL | 2 refills | Status: DC | PRN

## 2019-04-24 ENCOUNTER — Ambulatory Visit: Payer: BC Managed Care – PPO | Attending: Internal Medicine

## 2019-04-24 DIAGNOSIS — Z23 Encounter for immunization: Secondary | ICD-10-CM

## 2019-04-24 NOTE — Progress Notes (Signed)
   Covid-19 Vaccination Clinic  Name:  Melvin Hatfield    MRN: [id number] DOB: 15-Jul-1970  04/24/2019  [name] was observed post Covid-19 immunization for 15 minutes without incident. He was provided with Vaccine Information Sheet and instruction to access the V-Safe system.   [name] was instructed to call 911 with any severe reactions post vaccine: Marland Kitchen Difficulty breathing  . Swelling of face and throat  . A fast heartbeat  . A bad rash all over body  . Dizziness and weakness   Immunizations Administered    Name Date Dose VIS Date Route   Pfizer COVID-19 Vaccine 04/24/2019  8:35 AM 0.3 mL 01/03/2019 Intramuscular   Manufacturer: [employer], [employer]   Lot: [id number]   NDC: [address]-2

## 2019-05-20 ENCOUNTER — Ambulatory Visit: Payer: BC Managed Care – PPO | Attending: Internal Medicine

## 2019-05-20 DIAGNOSIS — Z23 Encounter for immunization: Secondary | ICD-10-CM

## 2019-05-20 NOTE — Progress Notes (Signed)
   Covid-19 Vaccination Clinic  Name:  Melvin Hatfield    MRN: [id number] DOB: 15-Aug-1970  05/20/2019  [name] was observed post Covid-19 immunization for 15 minutes without incident. He was provided with Vaccine Information Sheet and instruction to access the V-Safe system.   [name] was instructed to call 911 with any severe reactions post vaccine: Marland Kitchen Difficulty breathing  . Swelling of face and throat  . A fast heartbeat  . A bad rash all over body  . Dizziness and weakness   Immunizations Administered    Name Date Dose VIS Date Route   Pfizer COVID-19 Vaccine 05/20/2019  8:54 AM 0.3 mL 03/19/2018 Intramuscular   Manufacturer: [employer], [employer]   Lot: [id number]   NDC: [address]-3

## 2019-06-05 ENCOUNTER — Telehealth: Payer: Self-pay | Admitting: Urology

## 2019-06-05 NOTE — Telephone Encounter (Signed)
Patient notified and is wanting to do the MRI.

## 2019-06-05 NOTE — Telephone Encounter (Signed)
Will you check to see if I need to put in a new order for the renal MRI?

## 2019-06-05 NOTE — Telephone Encounter (Signed)
Would you call Melvin Hatfield and let him know that he needs to have the repeat MRI of his kidneys to follow the cyst?

## 2019-06-06 NOTE — Telephone Encounter (Signed)
The last order you put in expired in April 2021 and he never got that one done, so if he still needs one then he will need a new order. Last MRI he got was in 2020   [place]

## 2019-06-09 ENCOUNTER — Other Ambulatory Visit: Payer: Self-pay | Admitting: Urology

## 2019-06-09 DIAGNOSIS — N281 Cyst of kidney, acquired: Secondary | ICD-10-CM

## 2019-06-09 NOTE — Telephone Encounter (Signed)
Orders for the renal MRI are in place.

## 2019-06-24 ENCOUNTER — Ambulatory Visit (INDEPENDENT_AMBULATORY_CARE_PROVIDER_SITE_OTHER): Payer: BC Managed Care – PPO | Admitting: Family Medicine

## 2019-06-24 ENCOUNTER — Other Ambulatory Visit: Payer: Self-pay

## 2019-06-24 ENCOUNTER — Encounter: Payer: Self-pay | Admitting: Family Medicine

## 2019-06-24 VITALS — HR 41 | Ht 70.0 in | Wt 225.0 lb

## 2019-06-24 DIAGNOSIS — I252 Old myocardial infarction: Secondary | ICD-10-CM | POA: Diagnosis not present

## 2019-06-24 DIAGNOSIS — Z5181 Encounter for therapeutic drug level monitoring: Secondary | ICD-10-CM

## 2019-06-24 DIAGNOSIS — F339 Major depressive disorder, recurrent, unspecified: Secondary | ICD-10-CM | POA: Diagnosis not present

## 2019-06-24 DIAGNOSIS — I480 Paroxysmal atrial fibrillation: Secondary | ICD-10-CM

## 2019-06-24 DIAGNOSIS — E785 Hyperlipidemia, unspecified: Secondary | ICD-10-CM

## 2019-06-24 DIAGNOSIS — G4700 Insomnia, unspecified: Secondary | ICD-10-CM | POA: Diagnosis not present

## 2019-06-24 MED ORDER — SUVOREXANT 10 MG PO TABS: 10.0000 mg | ORAL_TABLET | Freq: Every evening | ORAL | 0 refills | Status: DC | PRN

## 2019-06-24 NOTE — Progress Notes (Signed)
Name: Melvin Hatfield   MRN: [id number]    DOB: 1970/07/05   Date:06/24/2019       Progress Note  Subjective:    Chief Complaint  Chief Complaint  Patient presents with  . Follow-up    I connected with  Domingo Madeira  on 06/24/19 at  9:40 AM EDT by a video enabled telemedicine application and verified that I am speaking with the correct person using two identifiers.  I discussed the limitations of evaluation and management by telemedicine and the availability of in person appointments. The patient expressed understanding and agreed to proceed. Staff also discussed with the patient that there may be a patient responsible charge related to this service. Patient Location: home Provider Location:  [facility] clinic Additional Individuals present: none  HPI F/up insomnia -  suvorexant/belsomra 10 mg he had previously failed melatonin, sleep hygiene, hydroxyzine, trazodone, had more than two decades of severe anxiety depression and insomnia. He has been compliant with his CPAP, he had tried reducing caffeine, turning off screens, reading before bed and multiple other techniques to try and help him sleep better. Last visit was little more than 2 months ago he also endorsed at that time worsening depressive symptoms with trouble concentrating, worse sleep and decreased energy. We did plan to increase his Zoloft from 100 mg daily to 125. We discussed medications for insomnia and were eventually able to get Belsomra approved for him. He did not pick up that occasions until about 3 weeks ago. He states that he has tried it and he feels much better. He is sleeping 7 to 8 hours a night, is able to get to sleep easier. Feels more well rested. He decreased his Zoloft back down to his normal dose of 100 mg. Depression screen [facility] 2/9 06/24/2019 04/21/2019 10/25/2018 12/28/2017 10/16/2017  Decreased Interest 0 1 0 0 0  Down, Depressed, Hopeless 0 1 0 0 0  PHQ - 2 Score 0 2 0 0 0  Altered sleeping 0 3 0 - 3  Tired, decreased  energy 0 3 0 - 2  Change in appetite 0 0 0 - 0  Feeling bad or failure about yourself  0 0 0 - 0  Trouble concentrating 0 1 0 - 1  Moving slowly or fidgety/restless 0 0 0 - 0  Suicidal thoughts 0 0 0 - 0  PHQ-9 Score 0 9 0 - 6  Difficult doing work/chores Not difficult at all Somewhat difficult Not difficult at all - Somewhat difficult  PHQ score reviewed today and improved since last OV  HLD, DAPT labs and monitoring -history of A. fib on his chart has a history of MI sees [facility] at [facility] cardiology for his Brilinta. He was mailed labs from her last office visit but has not completed them. He states that he has not out of his medication and he does have follow-up with his cardiologist who will resume management of Brilinta refills.  Patient states that he has kept his care in [place] because he is continuing to see urology in the [place] area though he has been living in [place]. He notes a history of a renal cyst -states he is having it monitored by urology, seeing [facility] urology Roxan Hockey. He will be coming to [facility] soon to do 6 month f/up imaging.    Patient Active Problem List   Diagnosis Date Noted  . Depression 11/27/2017  . Obesity (BMI 30.0-34.9) 10/16/2017  . Muscle fasciculation 08/17/2016  .  Medication monitoring encounter 08/17/2016  . Leg cramps 03/01/2016  . Dizziness 09/09/2015  . Skin lesion of right upper extremity 01/12/2015  . Contusion of toenail 01/12/2015  . Insomnia 01/12/2015  . Esophagitis, reflux 01/12/2015  . Rib pain on left side 01/12/2015  . Left renal mass 10/06/2014  . Nephrolithiasis 10/06/2014  . Left nephrolithiasis 08/17/2014  . Drug-induced skin rash 08/17/2014  . GERD without esophagitis 07/09/2014  . OSA on CPAP 07/09/2014  . 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 Hyperlipidemia LDL goal <70 07/09/2014  . History of  myocardial infarct at age less than 60 years 07/09/2014  . Coronary artery disease involving native coronary artery without angina pectoris 07/09/2014  . 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 Major depressive disorder, recurrent episode, in partial remission (HCC) 07/09/2014  . PAF (paroxysmal atrial fibrillation) (HCC) 06/16/2014    Social History   Tobacco Use  . Smoking status: Never Smoker  . Smokeless tobacco: Never Used  Substance Use Topics  . Alcohol use: No    Alcohol/week: 0.0 standard drinks     Current Outpatient Medications:  .  aspirin 81 MG chewable tablet, Chew 81 mg by mouth., Disp: , Rfl:  .  omeprazole (PRILOSEC) 20 MG capsule, Take 1 capsule (20 mg total) by mouth daily., Disp: 30 capsule, Rfl: 3 .  rosuvastatin (CRESTOR) 5 MG tablet, Take 1 tablet (5 mg total) by mouth at bedtime., Disp: 90 tablet, Rfl: 3 .  sertraline (ZOLOFT) 100 MG tablet, Take 1 tablet (100 mg total) by mouth daily., Disp: 90 tablet, Rfl: 3 .  Suvorexant 10 MG TABS, Take 10 mg by mouth at bedtime as needed (take 30 min before bedtime (need >8 hours for sleep))., Disp: 30 tablet, Rfl: 2 .  ticagrelor (BRILINTA) 90 MG TABS tablet, Take 1 tablet (90 mg total) by mouth 2 (two) times daily., Disp: 60 tablet, Rfl: 0  No Known Allergies  I personally reviewed active problem list, medication list, allergies, family history, social history, health maintenance, notes from last encounter, lab results, imaging with the patient/caregiver today.   Review of Systems  10 Systems reviewed and are negative for acute change except as noted in the HPI.   Objective:   Virtual encounter, vitals limited, only able to obtain the following Today's Vitals   06/24/19 1000  Pulse: (!) 41  Weight: 225 lb (102.1 kg)  Height: 5\' 10"  (1.778 m)   Body mass index is 32.28 kg/m. Nursing Note and Vital Signs reviewed.  Physical Exam Vitals and nursing note reviewed.  Constitutional:       General: He is not in acute distress.    Appearance: He is obese. He is not ill-appearing, toxic-appearing or diaphoretic.  Pulmonary:     Effort: No respiratory distress.  Neurological:     Mental Status: He is alert.  Psychiatric:        Mood and Affect: Mood normal.        Behavior: Behavior normal.     PE limited by telephone encounter  No results found for this or any previous visit (from the past 72 hour(s)).  Assessment and Plan:     ICD-10-CM   1. Insomnia, unspecified type  G47.00    pt got belsomra approved and filled less than a month ago, he is getting good sleep, his moods are better, discussed med SE/risk/monitoring  2. Episode of recurrent major depressive disorder, unspecified depression  episode severity (HCC)  F33.9    depressive sx improved with better quality of sleep, he was able to decrease zoloft dose back to 100 mg daily (had increased to 125 in March)  3. Hyperlipidemia LDL goal <70  I96.7 COMPLETE METABOLIC PANEL WITH GFR    Lipid panel   still has not done labs, though they were mailed to him, last lipid panel high and uncontrolled with hx of MI, due for labs, reorder today, on crestor 5 mg  4. History of myocardial infarct at age less than 47 years  I25.2 Lipid panel   on DAPT, managed by cardiology, he has f/up with cardiologist soon he states and they will manage brilinta  5. Medication monitoring encounter  Z51.81 CBC with Differential/Platelet    COMPLETE METABOLIC PANEL WITH GFR    Lipid panel  6. PAF (paroxysmal atrial fibrillation) ([place])  I48.0    per cardiology in [place], unable to see OV/records, pt states he has f/up appt soon    I explained to Lennon today that in the next 4 to 6 months he will absolutely need to come in person for an office visit and he will need to have his labs completed in order to allow [place] ability to properly care for him and monitor his conditions. He has not had a in person office visit since 2019, no labs since 2018  (?). Did explain to the patient that if he is unable to do this then he will be discharged from the practice and will need to establish elsewhere for his care.    Further explained that with controlled substances we do require in person office visits and closer monitoring in order to do the refills.  Belsomra refilled today and postdated for 07/03/2019 Told substance database reviewed today Reviewed the mechanism of action of medications, side effects, risk, chance of dependence, sedation or rebound worsening insomnia. Courage patient to continue to work on lifestyle changes, meditation, exercise and sleep hygiene. Did again review with him how cognitive behavioral therapy and establishing with a therapist or psychologist will be very helpful in managing his insomnia -first-line treatment which is recommended and better than Belsomra or other medications.    Patient's depression is much better currently is and patient does verbalize understanding of the risks side effects of medication currently has benefit does seem to outweigh the risk. Will need to monitor carefully, encouraged him to skip days of able.  Will need a in person follow-up in the next 3 to 6 months  - I discussed the assessment and treatment plan with the patient. The patient was provided an opportunity to ask questions and all were answered. The patient agreed with the plan and demonstrated an understanding of the instructions.  I provided 30+ minutes of non-face-to-face time during this encounter.  Delsa Grana, PA-C 06/24/19 10:26 AM

## 2019-07-07 ENCOUNTER — Ambulatory Visit
Admission: RE | Admit: 2019-07-07 | Discharge: 2019-07-07 | Disposition: A | Discharge status: Home / Self Care | Payer: BC Managed Care – PPO | Source: Ambulatory Visit | Attending: Urology | Admitting: Urology

## 2019-07-07 ENCOUNTER — Other Ambulatory Visit: Payer: Self-pay

## 2019-07-07 DIAGNOSIS — N281 Cyst of kidney, acquired: Secondary | ICD-10-CM | POA: Insufficient documentation

## 2019-07-07 IMAGING — MR MR ABDOMEN WO/W CM
18 series · 48 of 48 positions shown · IV contrast (10ml Gadavist)
Comparison: [DATE]

CLINICAL DATA: Renal cystic lesion follow up

EXAM:
MRI ABDOMEN WITHOUT AND WITH CONTRAST
TECHNIQUE: Multiplanar multisequence MR imaging of the abdomen was performed
both before and after the administration of intravenous contrast.
CONTRAST:  10mL GADAVIST GADOBUTROL 1 MMOL/ML IV SOLN

[Series 2: cor haste · coronal · 6.0mm · 1.19mm/px · 2 of 32 slices shown]
[im 1/32]
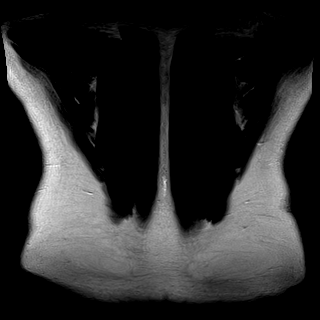
[im 32/32]
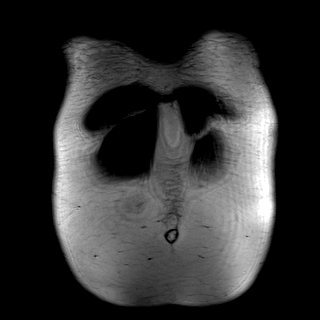

[Series 5: T2 fat-sat · axial · 6.0mm · 1.19mm/px · z∈[-110,+113]mm · 2 of 32 slices shown]
[im 1/32]
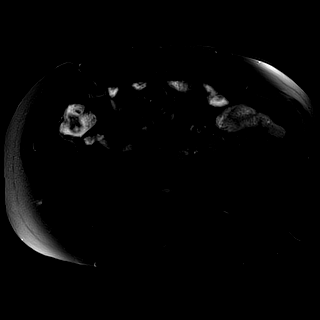
[im 32/32]
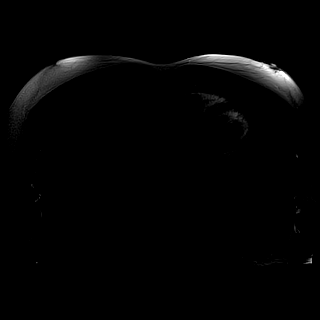

[Series 6: DWI · axial · 6.0mm · 1.42mm/px · z∈[-110,+113]mm · 5 of 96 slices shown (1 of 2)]
[im 1/96]
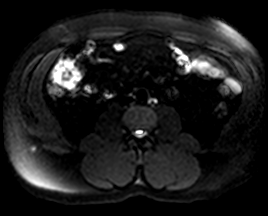
[im 24/96]
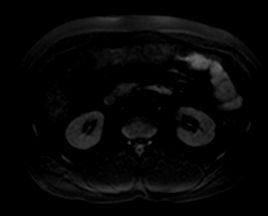
[im 48/96]
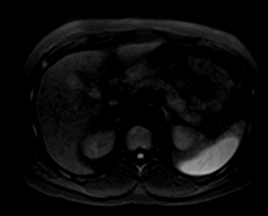
[im 72/96]
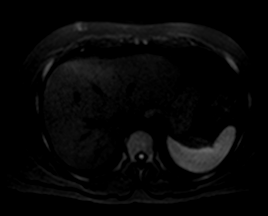
[im 96/96]
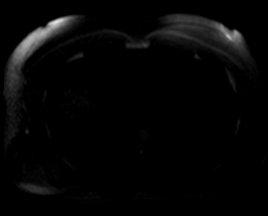

[Series 7: DWI · axial · 6.0mm · 1.42mm/px · 1 of 32 slices shown (2 of 2)]
[im 1/32]
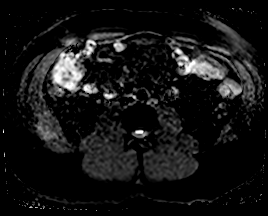

[Series 8: ax in & · axial · 3.0mm · 1.19mm/px · z∈[-105,+108]mm · 3 of 72 slices shown (1 of 2)]
[im 1/72]
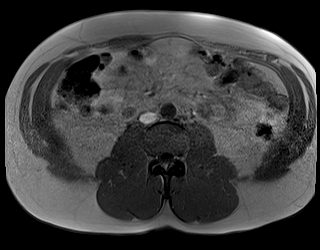
[im 36/72]
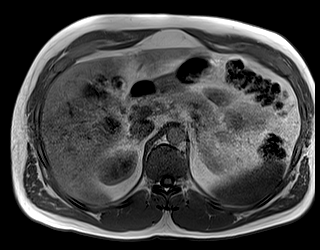
[im 72/72]
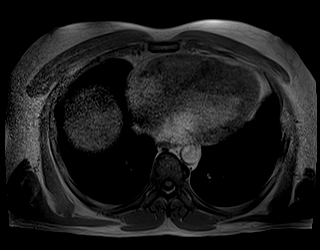

[Series 8: ax in & · axial · 3.0mm · 1.19mm/px · z∈[-105,+108]mm · 3 of 72 slices shown (2 of 2)]
[im 1/72]
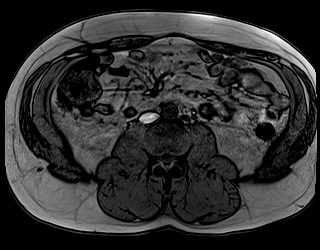
[im 36/72]
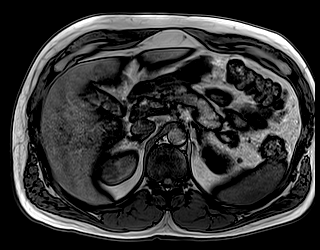
[im 72/72]
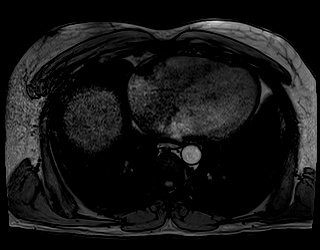

[Series 9: bSSFP · axial · 6.0mm · 0.74mm/px · 1 of 32 slices shown]
[im 1/32]
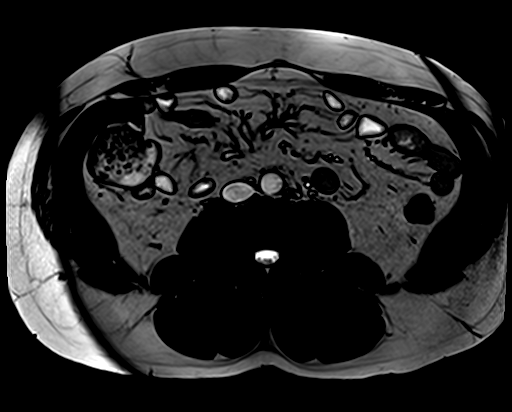

[Series 10: T1 dynamic · axial · non-contrast · 3.0mm · 1.19mm/px · z∈[-109,+104]mm · 3 of 72 slices shown (1 of 9)]
[im 1/72]
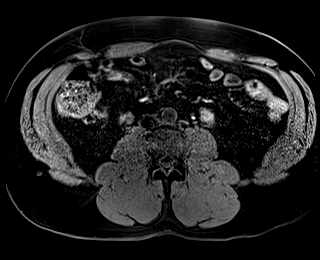
[im 36/72]
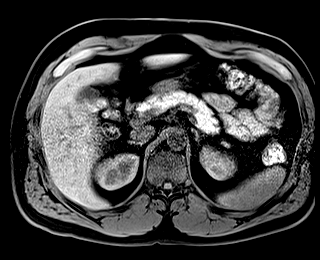
[im 72/72]
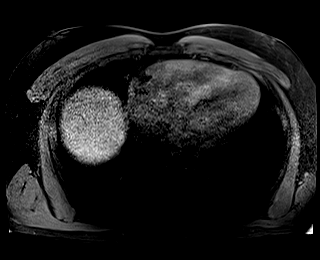

[Series 11: T1 dynamic · axial · 3.0mm · 1.19mm/px · z∈[-109,+104]mm · 3 of 72 slices shown (2 of 9)]
[im 1/72]
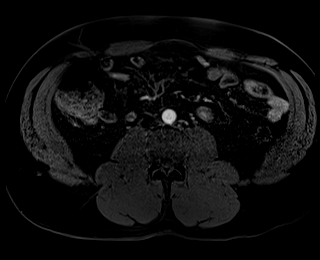
[im 36/72]
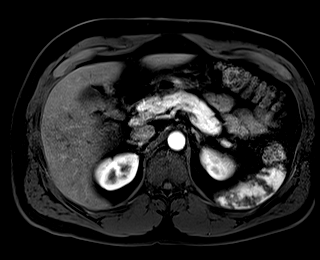
[im 72/72]
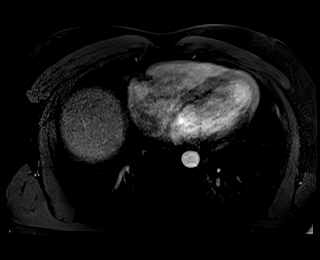

[Series 12: T1 dynamic · axial · 3.0mm · 1.19mm/px · z∈[-109,+104]mm · 3 of 72 slices shown (3 of 9)]
[im 1/72]
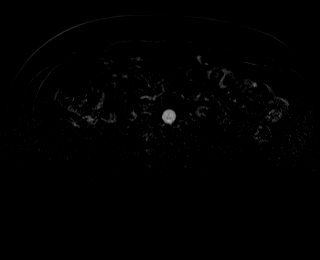
[im 36/72]
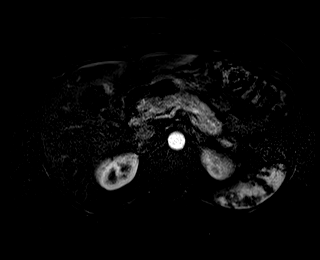
[im 72/72]
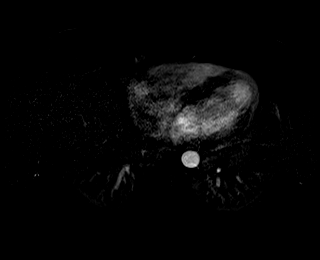

[Series 13: T1 dynamic · axial · 3.0mm · 1.19mm/px · z∈[-109,+104]mm · 3 of 72 slices shown (4 of 9)]
[im 1/72]
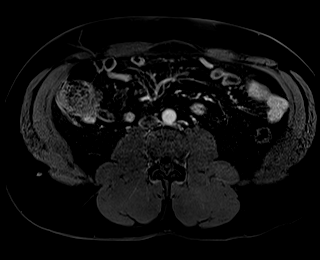
[im 36/72]
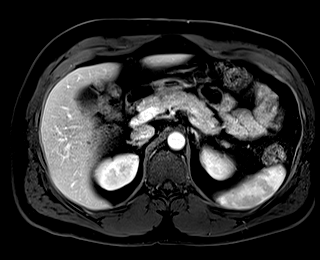
[im 72/72]
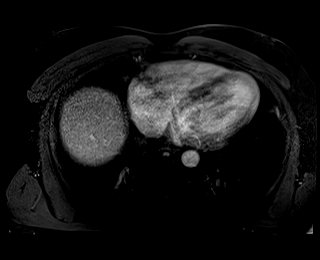

[Series 14: T1 dynamic · axial · 3.0mm · 1.19mm/px · z∈[-109,+104]mm · 3 of 72 slices shown (5 of 9)]
[im 1/72]
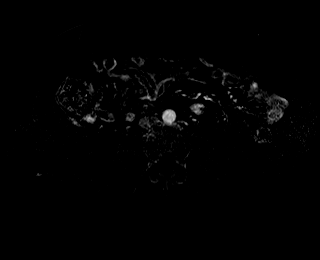
[im 36/72]
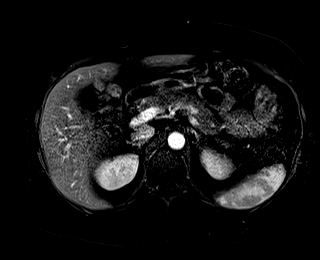
[im 72/72]
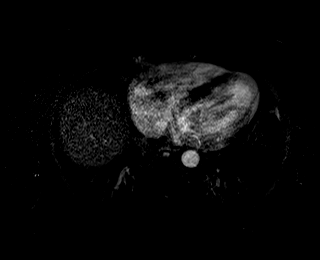

[Series 15: T1 dynamic · axial · 3.0mm · 1.19mm/px · z∈[-109,+104]mm · 3 of 72 slices shown (6 of 9)]
[im 1/72]
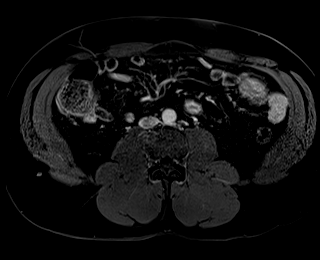
[im 36/72]
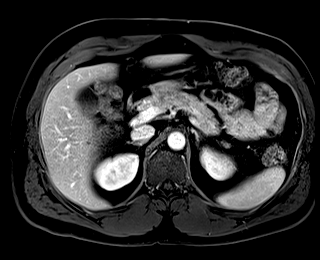
[im 72/72]
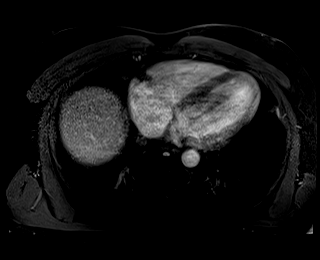

[Series 16: T1 dynamic · axial · 3.0mm · 1.19mm/px · z∈[-109,+104]mm · 3 of 72 slices shown (7 of 9)]
[im 1/72]
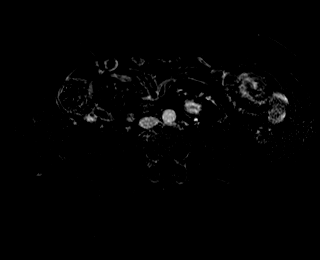
[im 36/72]
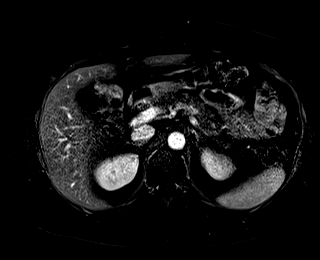
[im 72/72]
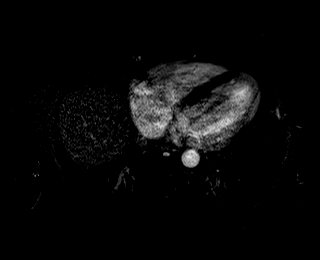

[Series 17: T1 dynamic post-contrast · coronal · 3.0mm · 1.31mm/px · 3 of 80 slices shown]
[im 1/80]
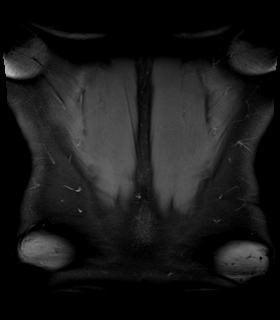
[im 40/80]
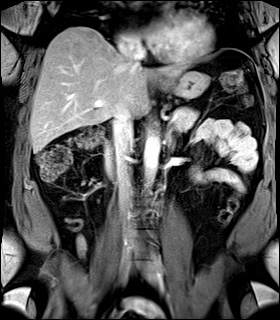
[im 80/80]
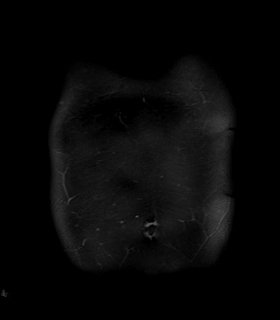

[Series 18: T2 · axial · 6.0mm · 1.19mm/px · 1 of 32 slices shown]
[im 1/32]
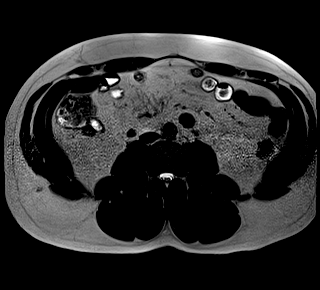

[Series 19: T1 dynamic · axial · 3.0mm · 1.19mm/px · z∈[-109,+104]mm · 3 of 72 slices shown (8 of 9)]
[im 1/72]
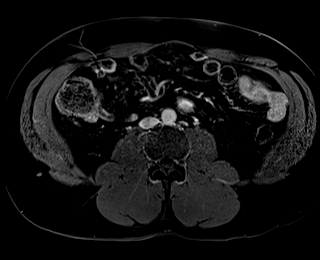
[im 36/72]
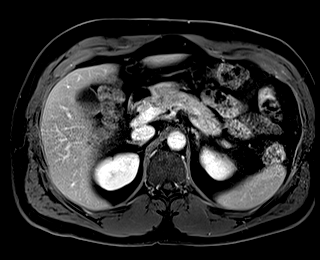
[im 72/72]
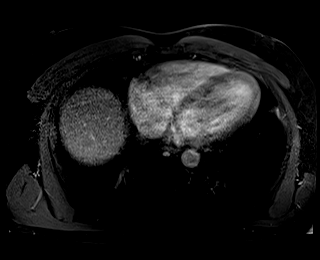

[Series 20: T1 dynamic · axial · 3.0mm · 1.19mm/px · z∈[-109,+104]mm · 3 of 72 slices shown (9 of 9)]
[im 1/72]
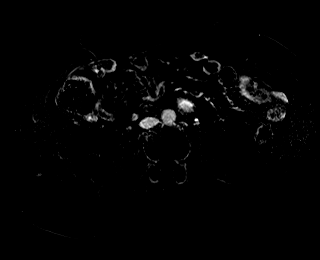
[im 36/72]
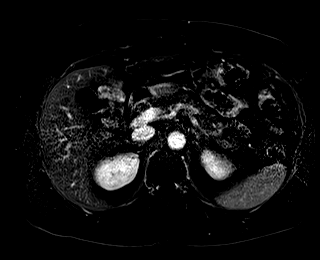
[im 72/72]
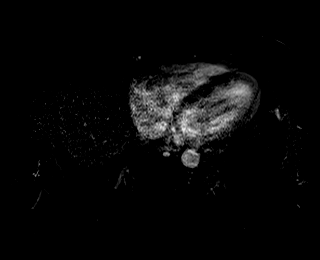

[48 of 48 positions shown; findings below may reference images not displayed]

FINDINGS: Lower chest: Mild cardiomegaly.

Hepatobiliary: No significant enhancement in the fluid signal
intensity segment 2 lesion in the liver compatible with a small
cyst. Similar lesion in the right hepatic lobe inferiorly on image
36/15, not changed.

Gallbladder unremarkable.

Pancreas:  Unremarkable

Spleen:  Unremarkable

Adrenals/Urinary Tract:  Both adrenal glands appear normal.

Complex cystic lesion of the left mid kidney laterally measuring
by 1.6 by 1.5 cm with at least 2 septations and with some slight
thickening of 1 of the posterior septations on images 54-57 of
series 15 which could simply be due to trapped cortical tissue but
which does qualify this lesion is Bosniak category IIF. This
thickened portion is not appreciably changed from prior and there is
no true mural nodule currently appreciated.

Both kidneys appear otherwise normal.

Stomach/Bowel: Unremarkable

Vascular/Lymphatic:  Unremarkable

Other:  No supplemental non-categorized findings.

Musculoskeletal: Unremarkable
IMPRESSION: 1. Stable appearance of the complex cystic lesion of the left mid
kidney laterally, measuring 1.9 by 1.6 by 1.5 cm. This has at least
AUJLA to septations and with some slight thickening of one of the
posterior septations which could simply be due to trapped cortical
tissue but which does qualify this lesion is Bosniak category IIF.
Although this lesion was visible on prior CT scan of [DATE]
(which is reassuring), the [DATE] exam is the first exam to
satisfactorily characterize the internal architecture of this lesion
and thus I recommend considering this is the first exam for
characterization purposes. Accordingly, renal protocol MRI with and
without contrast is recommended in 6 months time. This
recommendation follows ACR consensus guidelines: Management of the
Incidental Renal Mass on CT: A White Paper of the ACR Incidental
Findings Committee. [HOSPITAL] [AD];[DATE].
2. Two small hepatic cysts, stable.

## 2019-07-07 MED ORDER — GADOBUTROL 1 MMOL/ML IV SOLN
10.0000 mL | Freq: Once | INTRAVENOUS | Status: AC | PRN
  Administered 2019-07-07: 10 mL via INTRAVENOUS

## 2019-07-08 ENCOUNTER — Telehealth: Payer: Self-pay | Admitting: Family Medicine

## 2019-07-08 DIAGNOSIS — Z87442 Personal history of urinary calculi: Secondary | ICD-10-CM

## 2019-07-08 NOTE — Telephone Encounter (Signed)
-----   Message from Harle Battiest, PA-C sent at 07/07/2019 10:05 AM EDT ----- Please let Melvin Hatfield know that his MRI of his kidneys demonstrate that the left renal lesion remains unchanged and will need to have another MRI in 6 months.  I will also need to see him in clinic at that time for KUB, UA, I PSS, SHIM and exam.

## 2019-07-08 NOTE — Telephone Encounter (Signed)
Patient notified and voiced understanding. Appointment has been scheduled. 

## 2019-10-14 ENCOUNTER — Other Ambulatory Visit: Payer: Self-pay | Admitting: Family Medicine

## 2019-10-14 DIAGNOSIS — F3342 Major depressive disorder, recurrent, in full remission: Secondary | ICD-10-CM

## 2019-10-27 ENCOUNTER — Other Ambulatory Visit: Payer: Self-pay | Admitting: Family Medicine

## 2019-10-28 NOTE — Telephone Encounter (Signed)
Pt verbalized understanding of needing to come in for appt. He did schedule for this coming Thursday but he did ask if you would give him at least two pills so that he is able to sleep until his appt

## 2019-10-30 ENCOUNTER — Ambulatory Visit: Payer: BC Managed Care – PPO | Admitting: Family Medicine

## 2020-01-08 ENCOUNTER — Ambulatory Visit: Payer: Self-pay | Admitting: Urology

## 2020-02-09 NOTE — Progress Notes (Deleted)
8:09 PM   Melvin Hatfield [birth year] [id number]  Referring provider: Delsa Grana, PA-C [address] [place],  [place] [address]  No chief complaint on file.  Urological history 1. BPH with LU TS - I PSS - - cysto 2017 noted enlarged prostate, elevated bladder neck and a mass effect of the prostate to the bladder  2. Nephrolithiasis - left ESWL 03/2017 - Stone composition 74% Calcium Oxalate Monohydrate, 23% Calcium Oxalate Dihydrate and 3% Calcium Phosphate Carbonate - 8.2 mm nonobstructive stone in the lower left kidney on 09/2018 RUS  3. Intermediate risk hematuria - Non-smoker - CTU 03/2015 bilateral renal calculi likely accounting for the patient's microhematuria. No obstructing ureteral calculi or bladder calculi.  Lower pole left renal cyst. No worrisome renal lesions and no bladder abnormality.  No other significant abdominal/pelvic findings. - cysto 03/2015 Enlarged prostate (4.5 cm length), Elevated bladder neck (very mild) and slight mass effect of the prostate to the bladder, subtle  4. Left Renal Bosniak 28F cyst - MRI 06/2019 Stable appearance of the complex cystic lesion of the left mid kidney laterally, measuring 1.9 by 1.6 by 1.5 cm. This has at least spell to septations and with some slight thickening of one of the posterior septations which could simply be due to trapped cortical tissue but which does qualify this lesion is Bosniak category IIF.  HPI: Melvin Hatfield is a 50 y.o. male who presents today for follow up.  nephrolithiasis, a history of hematuria and a Bosniak 2 renal cyst who presents today for follow up.     Score:  1-7 Mild 8-19 Moderate 20-35 Severe             PMH: Past Medical History:  Diagnosis Date  . Allergy   . Arthritis   . Coronary artery disease involving native coronary artery without angina pectoris   . Depression   . GERD (gastroesophageal reflux disease)   . Hyperlipidemia   . Left nephrolithiasis  08/17/2014  . Major depressive disorder, recurrent episode, in partial remission ([place]) 07/09/2014   Doing well on current dosage of Sertraline 178m.   . Myocardial infarction ([place]   . Sleep apnea     Surgical History: Past Surgical History:  Procedure Laterality Date  . CORONARY ANGIOPLASTY WITH STENT PLACEMENT    . EXTRACORPOREAL SHOCK WAVE LITHOTRIPSY Left 04/05/2017   Procedure: EXTRACORPOREAL SHOCK WAVE LITHOTRIPSY (ESWL);  Surgeon: BHollice Espy MD;  Location: [facility] ORS;  Service: Urology;  Laterality: Left;  . reconstructed ear drum    . SPINE SURGERY    . TYMPANOSTOMY TUBE PLACEMENT     patient states he has had 6    Home Medications:  Allergies as of 02/10/2020   No Known Allergies     Medication List       Accurate as of February 09, 2020  8:09 PM. If you have any questions, ask your nurse or doctor.        aspirin 81 MG chewable tablet Chew 81 mg by mouth.   omeprazole 20 MG capsule Commonly known as: PRILOSEC Take 1 capsule (20 mg total) by mouth daily.   rosuvastatin 5 MG tablet Commonly known as: Crestor Take 1 tablet (5 mg total) by mouth at bedtime.   sertraline 100 MG tablet Commonly known as: ZOLOFT Take 1 tablet by mouth once daily   Suvorexant 10 MG Tabs Take 10 mg by mouth at bedtime as needed (take 30 min before bedtime (need >8 hours for sleep)).  ticagrelor 90 MG Tabs tablet Commonly known as: Brilinta Take 1 tablet (90 mg total) by mouth 2 (two) times daily.       Allergies: No Known Allergies  Family History: Family History  Problem Relation Age of Onset  . Cancer Mother   . Depression Mother   . Stroke Maternal Grandmother   . Stroke Maternal Grandfather   . Kidney disease Neg Hx   . Prostate cancer Neg Hx     Social History:  reports that he has never smoked. He has never used smokeless tobacco. He reports that he does not drink alcohol and does not use drugs.    ROS Pertinent ROS in HPI  Physical  Exam: Constitutional:  Well nourished. Alert and oriented, No acute distress. HEENT: [place] AT, moist mucus membranes.  Trachea midline Cardiovascular: No clubbing, cyanosis, or edema. Respiratory: Normal respiratory effort, no increased work of breathing. GI: Abdomen is soft, non tender, non distended, no abdominal masses. Liver and spleen not palpable.  No hernias appreciated.  Stool sample for occult testing is not indicated.   GU: No CVA tenderness.  No bladder fullness or masses.  Patient with circumcised/uncircumcised phallus. ***Foreskin easily retracted***  Urethral meatus is patent.  No penile discharge. No penile lesions or rashes. Scrotum without lesions, cysts, rashes and/or edema.  Testicles are located scrotally bilaterally. No masses are appreciated in the testicles. Left and right epididymis are normal. Rectal: Patient with  normal sphincter tone. Anus and perineum without scarring or rashes. No rectal masses are appreciated. Prostate is approximately *** grams, *** nodules are appreciated. Seminal vesicles are normal. Skin: No rashes, bruises or suspicious lesions. Lymph: No inguinal adenopathy. Neurologic: Grossly intact, no focal deficits, moving all 4 extremities. Psychiatric: Normal mood and affect.  Laboratory Data: Specimen:  Blood  Ref Range & Units 3 mo ago  WBC 3.5 - 10.5 10*9/L 6.2   RBC 4.32 - 5.72 10*12/L 4.77   HGB 13.5 - 17.5 g/dL 14.6   HCT 38.0 - 50.0 % 42.7   MCV 81.0 - 95.0 fL 89.6   MCH 26.0 - 34.0 pg 30.7   MCHC 30.0 - 36.0 g/dL 34.2   RDW 12.0 - 15.0 % 13.9   MPV 7.0 - 10.0 fL 8.9   Platelet 150 - 450 10*9/L 240   Resulting Agency  [facility]  Specimen Collected: 10/30/19 9:42 AM Last Resulted: 10/30/19 11:23 AM  Received From: [facility]  Result Received: 12/11/19 1:26 PM   Specimen:  Blood  Ref Range & Units 3 mo ago  Triglycerides 0 - 150 mg/dL 131   Cholesterol <=200 mg/dL 151   HDL 40 - 60 mg/dL 51   LDL Calculated 40 - 100  mg/dL 74   VLDL Cholesterol Cal 11 - 50 mg/dL 26.2   Chol/HDL Ratio 1.0 - 4.5 3.0   Non-HDL Cholesterol 70 - 130 mg/dL 100   FASTING  Yes   Resulting Agency  [facility]  Specimen Collected: 10/30/19 9:42 AM Last Resulted: 10/30/19 10:32 AM  Received From: [facility]  Result Received: 12/11/19 1:26 PM   Specimen:  Blood  Ref Range & Units 3 mo ago  Sodium 136 - 145 mmol/L 143   Potassium 3.5 - 5.1 mmol/L 4.7   Chloride 98 - 107 mmol/L 109High   Anion Gap 3 - 11 mmol/L 3   CO2 20.0 - 31.0 mmol/L 31.0   BUN 9 - 23 mg/dL 14   Creatinine 0.70 - 1.30  mg/dL 1.09   BUN/Creatinine Ratio  13   EGFR CKD-EPI Non-African American, Male mL/min/1.28m 80   EGFR CKD-EPI African American, Male mL/min/1.767m>90   Glucose 70 - 99 mg/dL 104High   Calcium 8.7 - 10.4 mg/dL 10.1   Albumin 3.4 - 5.0 g/dL 4.1   Total Protein 5.7 - 8.2 g/dL 7.1   Total Bilirubin 0.3 - 1.2 mg/dL 0.8   AST <34 U/L 25   ALT 10 - 49 U/L 24   Alkaline Phosphatase 46 - 116 U/L 70   [facility]  Specimen Collected: 10/30/19 9:42 AM Last Resulted: 10/30/19 10:32 AM  [facility] Received: 12/11/19 1:26 PM   PSA 0.7 ng/mL on 10/07/2015 PSA 0.9 ng/mL on 11/06/2016 PSA 0.90 ng/mL on 10/18/2018  I have reviewed the labs.  Pertinent Imaging No recent imaging  Assessment & Plan:    1. BPH with LUTS IPSS score is 13/2, it is worsening Continue conservative management, avoiding bladder irritants and timed voiding's Most bothersome symptoms is/are frequency and a weak stream, but he does not want to pursue further evaluation or treatment  RTC in 12 months for IPSS, PSA, PVR and exam   2. History of hematuria Completed a hematuria work up in 03/2015 - no worrisome findings UA will check at next appointment  No reports of gross hematuria  3. Bosniak 6F left renal cyst MRI pending  4. History of nephrolithiasis No stones seen on recent RUS   No  follow-ups on file.  Lanna Labella, PA-C

## 2020-02-10 ENCOUNTER — Ambulatory Visit: Payer: Self-pay | Admitting: Urology

## 2020-02-10 DIAGNOSIS — N281 Cyst of kidney, acquired: Secondary | ICD-10-CM

## 2020-02-10 DIAGNOSIS — N138 Other obstructive and reflux uropathy: Secondary | ICD-10-CM

## 2020-02-10 DIAGNOSIS — R319 Hematuria, unspecified: Secondary | ICD-10-CM

## 2020-02-10 DIAGNOSIS — N200 Calculus of kidney: Secondary | ICD-10-CM

## 2020-02-11 ENCOUNTER — Telehealth: Payer: Self-pay

## 2020-02-11 NOTE — Telephone Encounter (Signed)
I called patient to reschedule appointment due to it being canceled on Monday.  He was curious to know if he needed an MRI. I told him he was scheduled for a KUB.  He states if he needs an MRI he rather do an MRI instead of KUB and MRI, please advise

## 2020-02-11 NOTE — Telephone Encounter (Signed)
The MRI is only of his kidneys and therefore we may miss lower ureteral stones.  That is why I also would like a KUB.

## 2020-02-11 NOTE — Telephone Encounter (Signed)
Patient notified and will get KUB before coming to the appointment.

## 2020-02-17 NOTE — Progress Notes (Signed)
9:06 AM   Melvin Hatfield January 16, 1971 [id number]  Referring provider: Delsa Grana, PA-C [address] [place],  [place] [address]  Chief Complaint  Patient presents with  . Nephrolithiasis   Urological history 1. BPH with LU TS - I PSS 7/1 - cysto 2017 noted enlarged prostate, elevated bladder neck and a mass effect of the prostate to the bladder  2. Nephrolithiasis - left ESWL 03/2017 - Stone composition 74% Calcium Oxalate Monohydrate, 23% Calcium Oxalate Dihydrate and 3% Calcium Phosphate Carbonate - 8.2 mm nonobstructive stone in the lower left kidney on 09/2018 RUS  3. Intermediate risk hematuria - Non-smoker - CTU 03/2015 bilateral renal calculi likely accounting for the patient's microhematuria. No obstructing ureteral calculi or bladder calculi.  Lower pole left renal cyst. No worrisome renal lesions and no bladder abnormality.  No other significant abdominal/pelvic findings. - cysto 03/2015 Enlarged prostate (4.5 cm length), Elevated bladder neck (very mild) and slight mass effect of the prostate to the bladder, subtle  4. Left Renal Bosniak 79F cyst - MRI 06/2019 Stable appearance of the complex cystic lesion of the left mid kidney laterally, measuring 1.9 by 1.6 by 1.5 cm. This has at least spell to septations and with some slight thickening of one of the posterior septations which could simply be due to trapped cortical tissue but which does qualify this lesion is Bosniak category IIF.  HPI: Melvin Hatfield is a 50 y.o. male who presents today for follow up.    He complains of a weak urinary stream.  Patient denies any modifying or aggravating factors.  Patient denies any gross hematuria, dysuria or suprapubic/flank pain.  Patient denies any fevers, chills, nausea or vomiting.   UA 3-10 RBC's    IPSS    Row Name 02/18/20 1500         International Prostate Symptom Score   How often have you had the sensation of not emptying your bladder? Less than half  the time     How often have you had to urinate less than every two hours? Less than 1 in 5 times     How often have you found you stopped and started again several times when you urinated? Less than 1 in 5 times     How often have you found it difficult to postpone urination? Not at All     How often have you had a weak urinary stream? About half the time     How often have you had to strain to start urination? Not at All     How many times did you typically get up at night to urinate? None     Total IPSS Score 7           Quality of Life due to urinary symptoms   If you were to spend the rest of your life with your urinary condition just the way it is now how would you feel about that? Pleased            Score:  1-7 Mild 8-19 Moderate 20-35 Severe  Patient still having spontaneous erections.  He denies any pain or curvature with erections.    SHIM    Row Name 02/18/20 1509         SHIM: Over the last 6 months:   How do you rate your confidence that you could get and keep an erection? Very High     When you had erections with sexual stimulation, how often were  your erections hard enough for penetration (entering your partner)? Almost Always or Always     During sexual intercourse, how often were you able to maintain your erection after you had penetrated (entered) your partner? Almost Always or Always     During sexual intercourse, how difficult was it to maintain your erection to completion of intercourse? Not Difficult     When you attempted sexual intercourse, how often was it satisfactory for you? Almost Always or Always           SHIM Total Score   SHIM 25             PMH: Past Medical History:  Diagnosis Date  . Allergy   . Arthritis   . Coronary artery disease involving native coronary artery without angina pectoris   . Depression   . GERD (gastroesophageal reflux disease)   . Hyperlipidemia   . Left nephrolithiasis 08/17/2014  . Major depressive disorder,  recurrent episode, in partial remission ([place]) 07/09/2014   Doing well on current dosage of Sertraline 127m.   . [place]   . Sleep apnea     Surgical History: Past Surgical History:  Procedure Laterality Date  . CORONARY ANGIOPLASTY WITH STENT PLACEMENT    . EXTRACORPOREAL SHOCK WAVE LITHOTRIPSY Left 04/05/2017   Procedure: EXTRACORPOREAL SHOCK WAVE LITHOTRIPSY (ESWL);  Surgeon: BHollice Espy MD;  Location: [facility] ORS;  Service: Urology;  Laterality: Left;  . reconstructed ear drum    . SPINE SURGERY    . TYMPANOSTOMY TUBE PLACEMENT     patient states he has had 6    Home Medications:  Allergies as of 02/18/2020   No Known Allergies     Medication List       Accurate as of February 18, 2020 11:59 PM. If you have any questions, ask your nurse or doctor.        STOP taking these medications   Suvorexant 10 MG Tabs Stopped by: Donevan Biller, PA-C     TAKE these medications   aspirin 81 MG chewable tablet Chew 81 mg by mouth.   omeprazole 20 MG capsule Commonly known as: PRILOSEC Take 1 capsule (20 mg total) by mouth daily.   rosuvastatin 5 MG tablet Commonly known as: Crestor Take 1 tablet (5 mg total) by mouth at bedtime.   sertraline 100 MG tablet Commonly known as: ZOLOFT Take 1 tablet by mouth once daily   ticagrelor 90 MG Tabs tablet Commonly known as: Brilinta Take 1 tablet (90 mg total) by mouth 2 (two) times daily.       Allergies: No Known Allergies  Family History: Family History  Problem Relation Age of Onset  . Cancer Mother   . Depression Mother   . Stroke Maternal Grandmother   . Stroke Maternal Grandfather   . Kidney disease Neg Hx   . Prostate cancer Neg Hx     Social History:  reports that he has never smoked. He has never used smokeless tobacco. He reports that he does not drink alcohol and does not use drugs.    ROS Pertinent ROS in HPI  Physical Exam: Blood pressure (!) 148/78, pulse (!) 54, height 5'  10" (1.778 m), weight 230 lb (104.3 kg). Constitutional:  Well nourished. Alert and oriented, No acute distress. HEENT: [place] AT, mask in place.  Trachea midline Cardiovascular: No clubbing, cyanosis, or edema. Respiratory: Normal respiratory effort, no increased work of breathing. Neurologic: Grossly intact, no focal deficits, moving all 4 extremities. Psychiatric:  Normal mood and affect. Patient refused prostate exam  Laboratory Data: Specimen:  Blood  Ref Range & Units 3 mo ago  WBC 3.5 - 10.5 10*9/L 6.2   RBC 4.32 - 5.72 10*12/L 4.77   HGB 13.5 - 17.5 g/dL 14.6   HCT 38.0 - 50.0 % 42.7   MCV 81.0 - 95.0 fL 89.6   MCH 26.0 - 34.0 pg 30.7   MCHC 30.0 - 36.0 g/dL 34.2   RDW 12.0 - 15.0 % 13.9   MPV 7.0 - 10.0 fL 8.9   Platelet 150 - 450 10*9/L 240   Resulting Agency  [facility]  Specimen Collected: 10/30/19 9:42 AM Last Resulted: 10/30/19 11:23 AM  Received From: [place]  Result Received: 12/11/19 1:26 PM   Specimen:  Blood  Ref Range & Units 3 mo ago  Triglycerides 0 - 150 mg/dL 131   Cholesterol <=200 mg/dL 151   HDL 40 - 60 mg/dL 51   LDL Calculated 40 - 100 mg/dL 74   VLDL Cholesterol Cal 11 - 50 mg/dL 26.2   Chol/HDL Ratio 1.0 - 4.5 3.0   Non-HDL Cholesterol 70 - 130 mg/dL 100   FASTING  Yes   Resulting Agency  [facility]  Specimen Collected: 10/30/19 9:42 AM Last Resulted: 10/30/19 10:32 AM  Received From: [place]  Result Received: 12/11/19 1:26 PM   Specimen:  Blood  Ref Range & Units 3 mo ago  Sodium 136 - 145 mmol/L 143   Potassium 3.5 - 5.1 mmol/L 4.7   Chloride 98 - 107 mmol/L 109High   Anion Gap 3 - 11 mmol/L 3   CO2 20.0 - 31.0 mmol/L 31.0   BUN 9 - 23 mg/dL 14   Creatinine 0.70 - 1.30 mg/dL 1.09   BUN/Creatinine Ratio  13   EGFR CKD-EPI Non-African American, Male mL/min/1.57m 80   EGFR CKD-EPI African American, Male mL/min/1.775m>90   Glucose 70 - 99 mg/dL 104High   Calcium 8.7 - 10.4 mg/dL 10.1   Albumin  3.4 - 5.0 g/dL 4.1   Total Protein 5.7 - 8.2 g/dL 7.1   Total Bilirubin 0.3 - 1.2 mg/dL 0.8   AST <34 U/L 25   ALT 10 - 49 U/L 24   Alkaline Phosphatase 46 - 116 U/L 70   [facility]  Specimen Collected: 10/30/19 9:42 AM Last Resulted: 10/30/19 10:32 AM  [facility]: [facility] Received: 12/11/19 1:26 PM   PSA 0.7 ng/mL on 10/07/2015 PSA 0.9 ng/mL on 11/06/2016 PSA 0.90 ng/mL on 10/18/2018  I have reviewed the labs.  Pertinent Imaging No recent imaging  Assessment & Plan:    1. Microscopic hematuria - today's UA with 3-10 RBC's - Patient has a Bosniak 19F left renal cyst, so I will go ahead and order a CTU for further evaluation of his cyst and micro heme - explained the CT using contrast - he has no allergies to contrast, seafood or iodine - will also schedule a cystoscopy to complete the workup - reviewed how the cysto is performed and what to expect afterwards  2. Bosniak 19F left renal cyst - CTU pending  3. BPH with LUTS - IPSS score is 7/0, it is improved - Continue conservative management, avoiding bladder irritants and timed voiding's Most bothersome symptoms is/are frequency and a weak stream - cystoscopy pending  4. Nephrolithiasis - 8.2 mm nonobstructive stone in the lower left kidney on 09/2018 RUS -  asymptomatic - CTU pending   Return for CTU report and cysto with Dr. Erlene Quan for micro heme and 38F cyst .  Zara Council, PA-C

## 2020-02-18 ENCOUNTER — Other Ambulatory Visit: Payer: Self-pay

## 2020-02-18 ENCOUNTER — Ambulatory Visit
Admission: RE | Admit: 2020-02-18 | Discharge: 2020-02-18 | Disposition: A | Discharge status: Home / Self Care | Payer: BC Managed Care – PPO | Attending: Urology | Admitting: Urology

## 2020-02-18 ENCOUNTER — Ambulatory Visit (INDEPENDENT_AMBULATORY_CARE_PROVIDER_SITE_OTHER): Payer: BC Managed Care – PPO | Admitting: Urology

## 2020-02-18 ENCOUNTER — Encounter: Payer: Self-pay | Admitting: Urology

## 2020-02-18 ENCOUNTER — Ambulatory Visit
Admission: RE | Admit: 2020-02-18 | Discharge: 2020-02-18 | Disposition: A | Discharge status: Home / Self Care | Payer: BC Managed Care – PPO | Source: Ambulatory Visit | Attending: Urology | Admitting: Urology

## 2020-02-18 VITALS — BP 148/78 | HR 54 | Ht 70.0 in | Wt 230.0 lb

## 2020-02-18 DIAGNOSIS — Z87442 Personal history of urinary calculi: Secondary | ICD-10-CM

## 2020-02-18 DIAGNOSIS — R3129 Other microscopic hematuria: Secondary | ICD-10-CM

## 2020-02-18 DIAGNOSIS — N200 Calculus of kidney: Secondary | ICD-10-CM | POA: Diagnosis not present

## 2020-02-18 DIAGNOSIS — N281 Cyst of kidney, acquired: Secondary | ICD-10-CM

## 2020-02-18 DIAGNOSIS — N138 Other obstructive and reflux uropathy: Secondary | ICD-10-CM

## 2020-02-18 DIAGNOSIS — N401 Enlarged prostate with lower urinary tract symptoms: Secondary | ICD-10-CM

## 2020-02-18 DIAGNOSIS — R319 Hematuria, unspecified: Secondary | ICD-10-CM

## 2020-02-18 IMAGING — CR DG ABDOMEN 1V
1 series · 2 of 2 positions shown · non-contrast
Comparison: [DATE]

CLINICAL DATA: Follow-up imaging for kidney stones. No current
pain.

EXAM:
ABDOMEN - 1 VIEW

[Series 1: dg abd 1 view · 0.14mm/px · 2 of 2 slices shown]
[im 1/2]
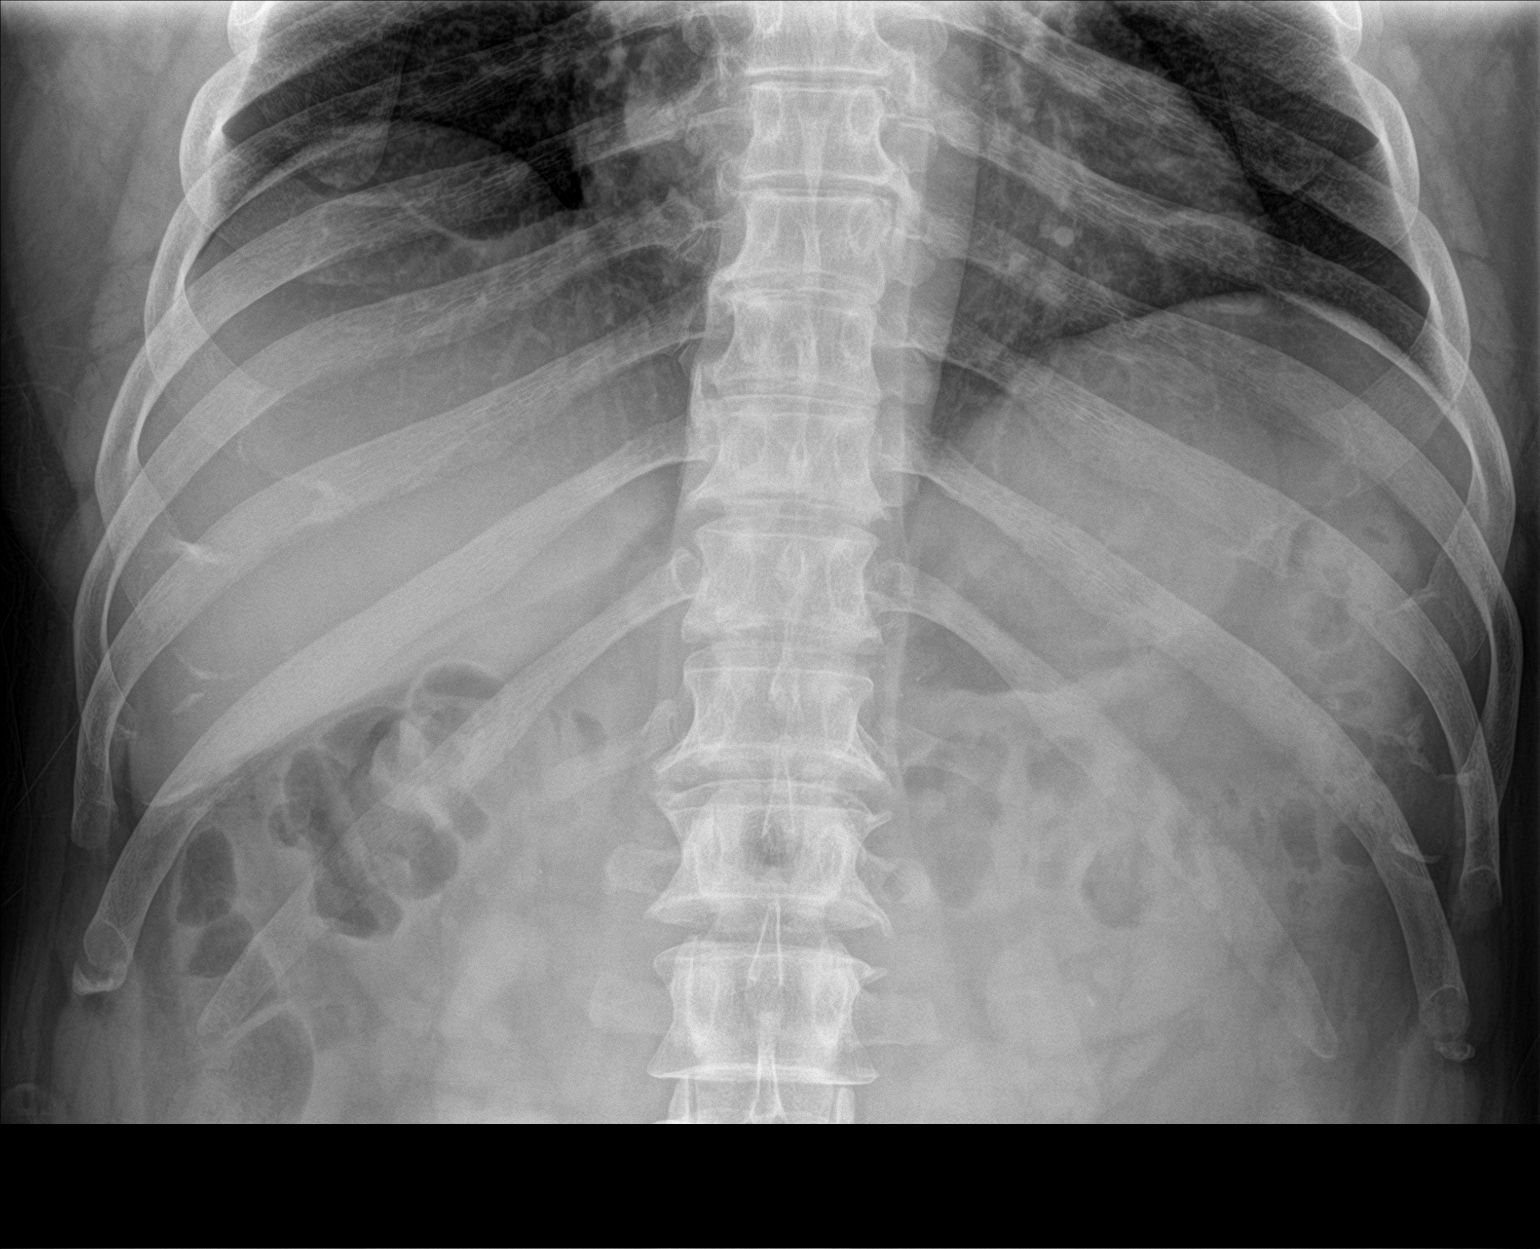
[im 2/2]
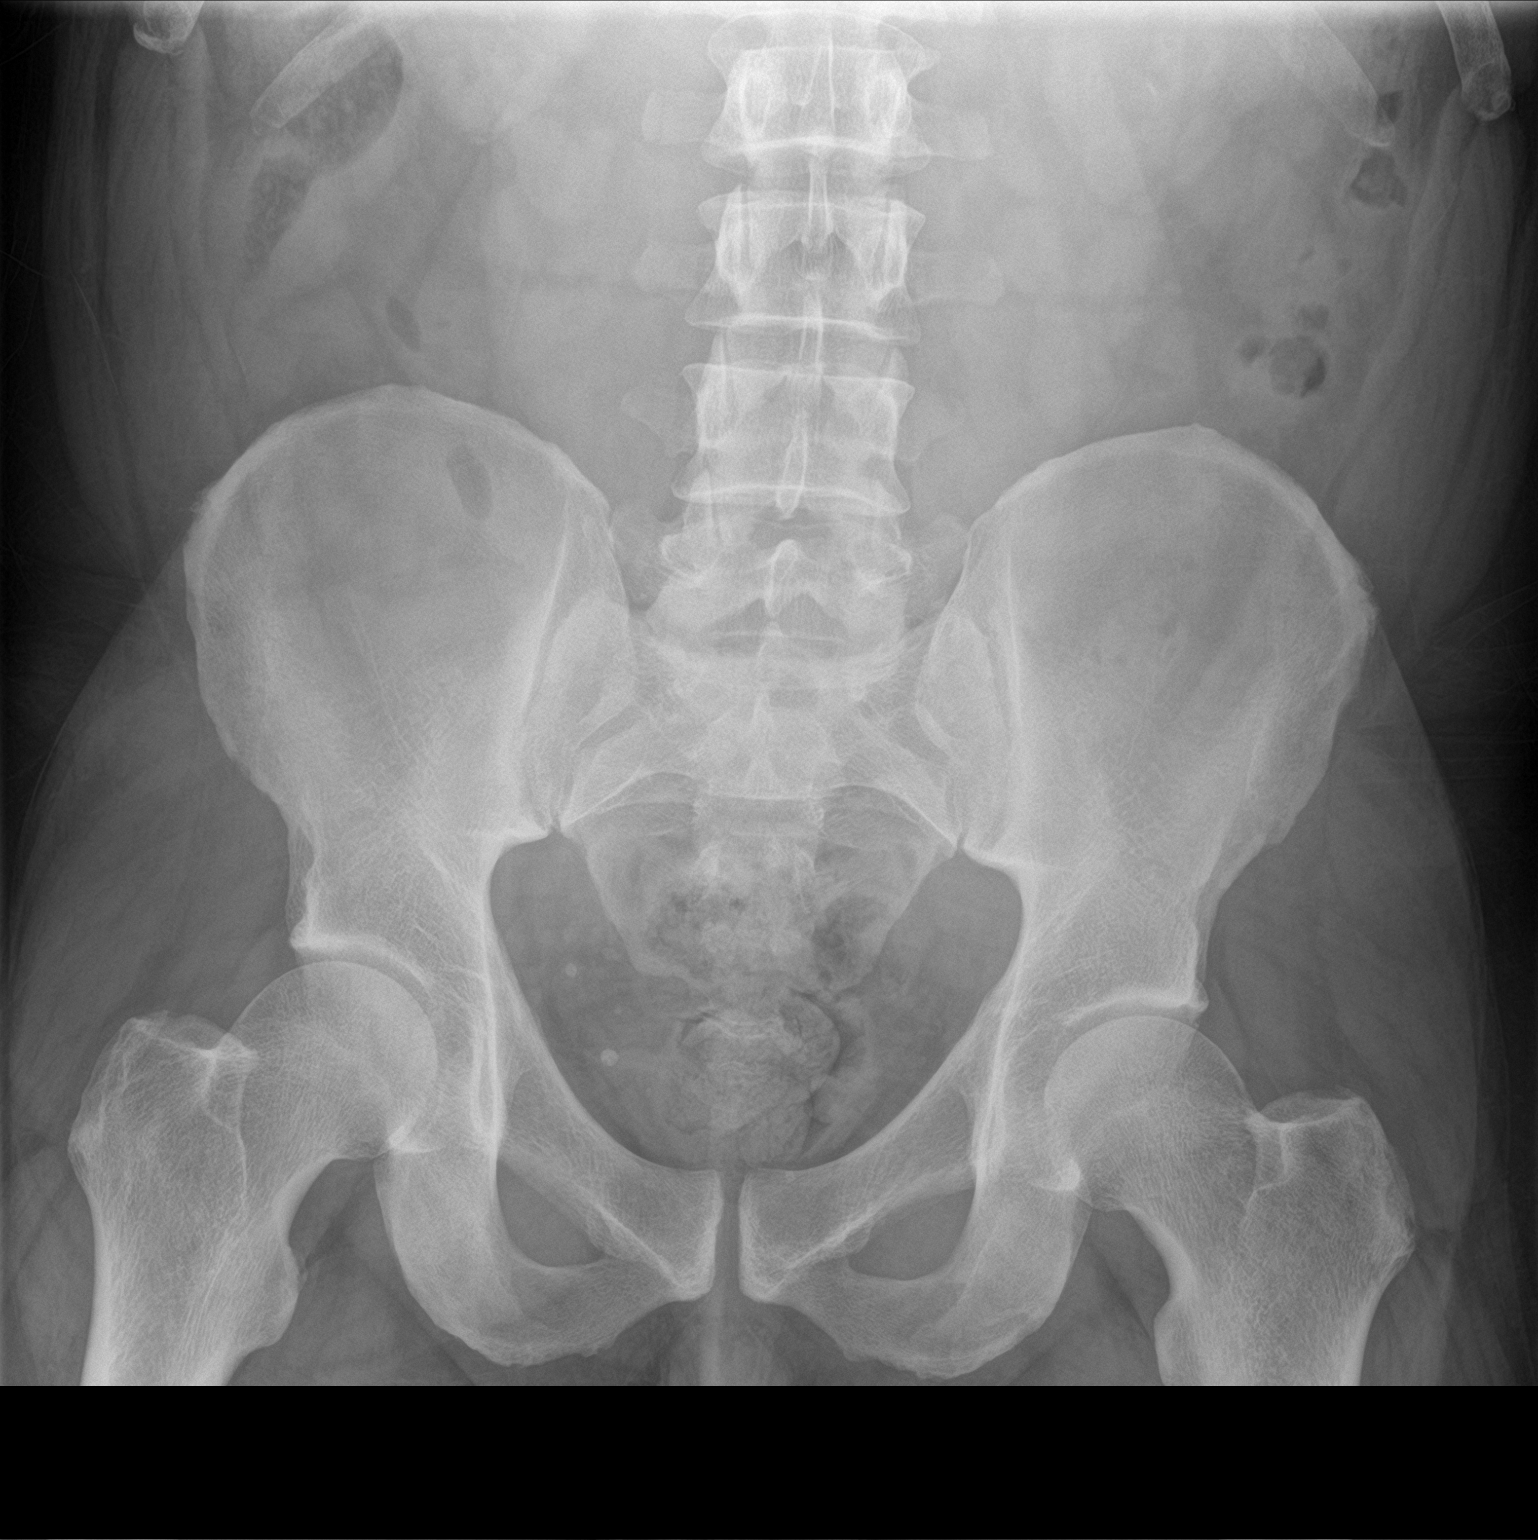

[2 of 2 positions shown; findings below may reference images not displayed]

FINDINGS: Gas and stool in the colon obscure significant visualization of the
area of the kidneys. There is suggestion of a tiny calcification
projected over the lower pole left kidney which was probably present
previously. No stones identified along the expected course of the
ureters. Calcified phleboliths in the pelvis. Scattered gas and
stool in the colon. No small or large bowel distention. Degenerative
changes in the spine.
IMPRESSION: Probable tiny stone projected over the lower pole left kidney. No
stones identified along the expected course of the ureters.

## 2020-02-19 LAB — PSA: Prostate Specific Ag, Serum: 0.8 ng/mL (ref 0.0–4.0)

## 2020-02-20 LAB — URINALYSIS, COMPLETE
Bilirubin, UA: NEGATIVE
Glucose, UA: NEGATIVE
Ketones, UA: NEGATIVE
Leukocytes,UA: NEGATIVE
Nitrite, UA: NEGATIVE
Protein,UA: NEGATIVE
Specific Gravity, UA: 1.025 (ref 1.005–1.030)
Urobilinogen, Ur: 0.2 mg/dL (ref 0.2–1.0)
pH, UA: 6 (ref 5.0–7.5)

## 2020-02-20 LAB — MICROSCOPIC EXAMINATION: Bacteria, UA: NONE SEEN

## 2020-03-11 ENCOUNTER — Other Ambulatory Visit: Payer: Self-pay | Admitting: Urology

## 2020-03-11 DIAGNOSIS — R3129 Other microscopic hematuria: Secondary | ICD-10-CM

## 2020-03-11 DIAGNOSIS — N281 Cyst of kidney, acquired: Secondary | ICD-10-CM

## 2020-03-11 NOTE — Telephone Encounter (Signed)
Theres no order, I can place

## 2020-03-11 NOTE — Telephone Encounter (Signed)
Should this pt still have the CTU?

## 2020-03-11 NOTE — Progress Notes (Signed)
Order in for CT urogram.

## 2020-03-23 ENCOUNTER — Other Ambulatory Visit: Payer: Self-pay

## 2020-03-23 ENCOUNTER — Ambulatory Visit
Admission: RE | Admit: 2020-03-23 | Discharge: 2020-03-23 | Disposition: A | Discharge status: Home / Self Care | Payer: BC Managed Care – PPO | Source: Ambulatory Visit | Attending: Urology | Admitting: Urology

## 2020-03-23 ENCOUNTER — Other Ambulatory Visit: Payer: Self-pay | Admitting: Urology

## 2020-03-23 DIAGNOSIS — N281 Cyst of kidney, acquired: Secondary | ICD-10-CM | POA: Diagnosis present

## 2020-03-23 DIAGNOSIS — R3129 Other microscopic hematuria: Secondary | ICD-10-CM | POA: Insufficient documentation

## 2020-03-23 IMAGING — CT CT ABD-PEL WO/W CM
2 of 6 series · 12 of 32 positions shown, 17 images · IV contrast (omnipaque)
Comparison: MRI of the abdomen [DATE] and CT radiograph
[DATE].

CLINICAL DATA: Painless microscopic hematuria, known Bosniak 2 F
renal cyst

EXAM:
CT ABDOMEN AND PELVIS WITHOUT AND WITH CONTRAST
TECHNIQUE: Multidetector CT imaging of the abdomen and pelvis was performed
following the standard protocol before and following the bolus
administration of intravenous contrast.
CONTRAST:  125mL OMNIPAQUE IOHEXOL 300 MG/ML  SOLN

[Series 2: axial pre · axial · non-contrast · 0.83mm/px · z∈[-985,-625]mm · 6 of 102 slices shown, 11 images]
[im 15/102  soft-tissue]
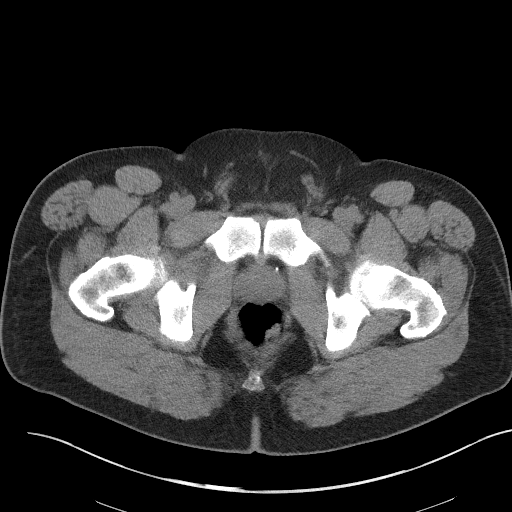
[im 15/102  bone]
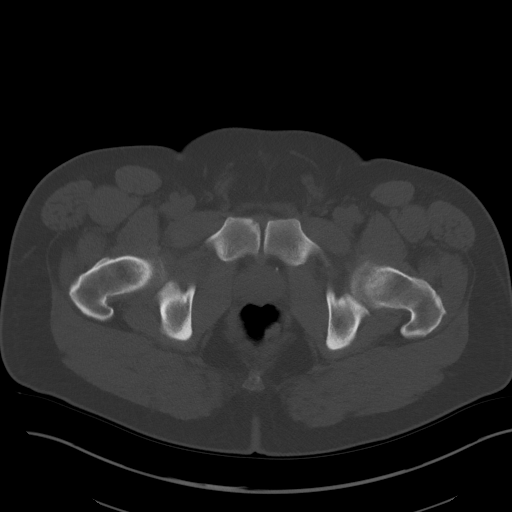
[im 29/102  soft-tissue]
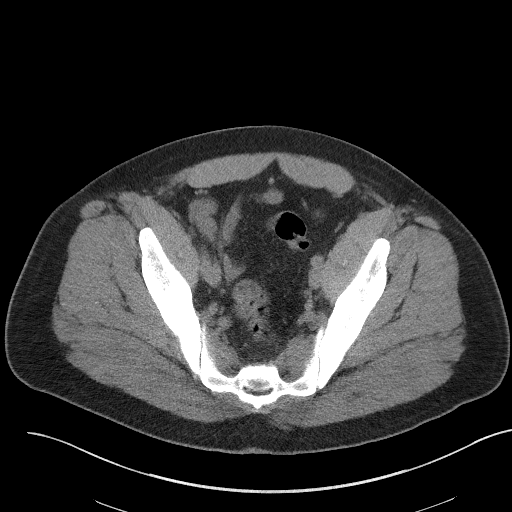
[im 44/102  soft-tissue]
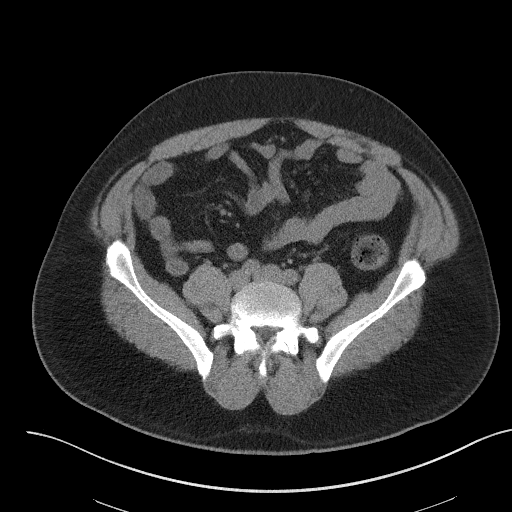
[im 44/102  lung]
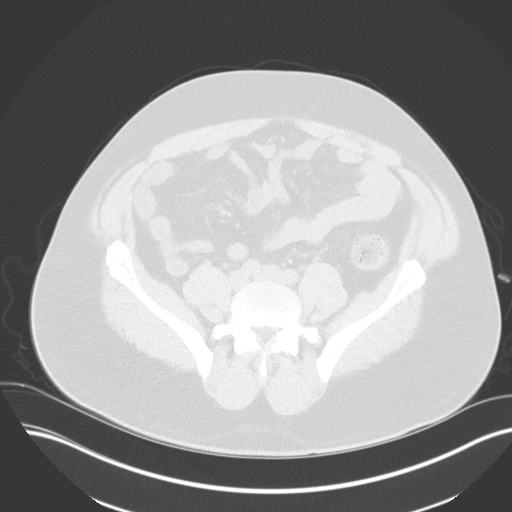
[im 58/102  soft-tissue]
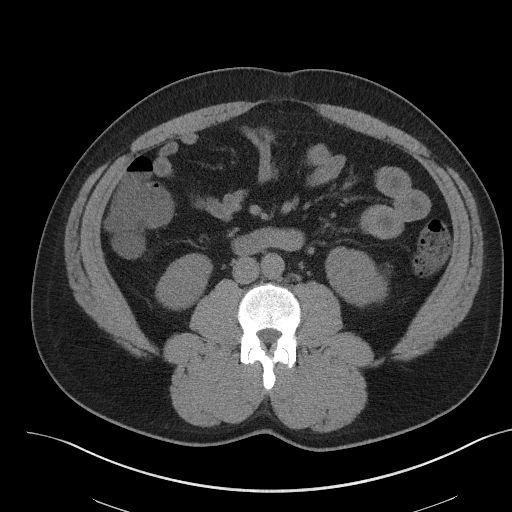
[im 58/102  lung]
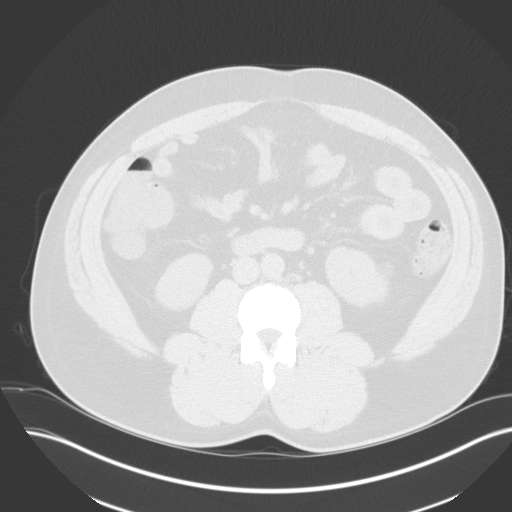
[im 73/102  soft-tissue]
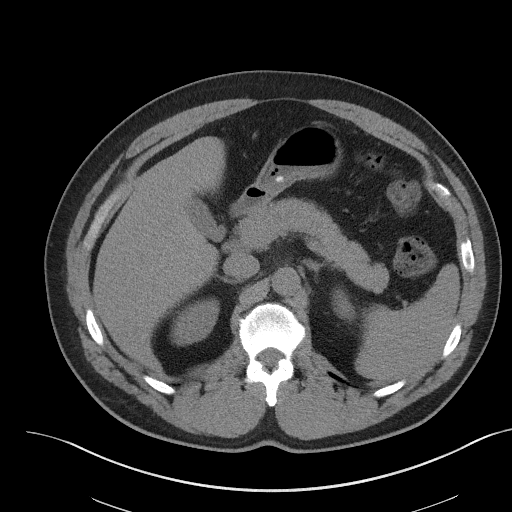
[im 73/102  lung]
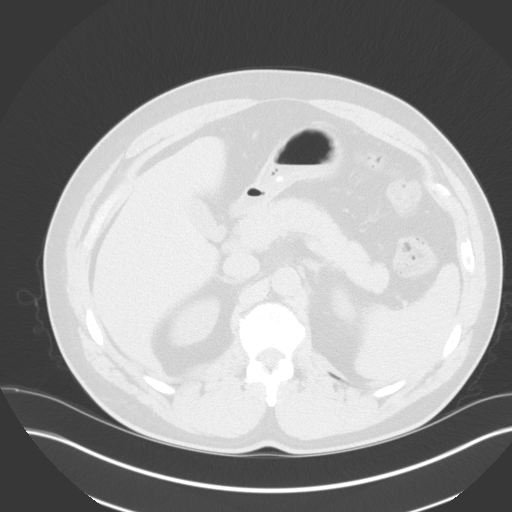
[im 87/102  soft-tissue]
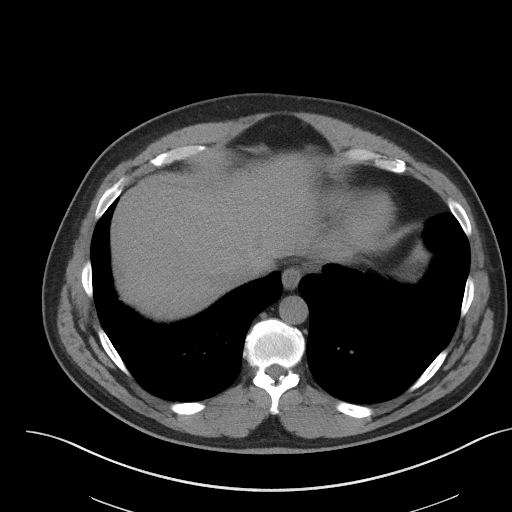
[im 87/102  lung]
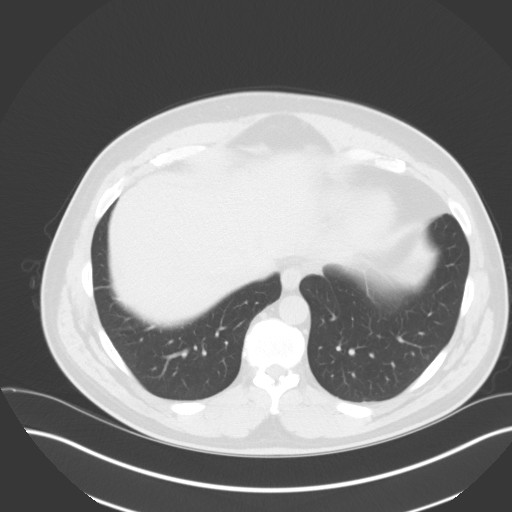

[Series 5: axial post · axial · 0.83mm/px · z∈[-995,-625]mm · 6 of 104 slices shown]
[im 15/104  soft-tissue]
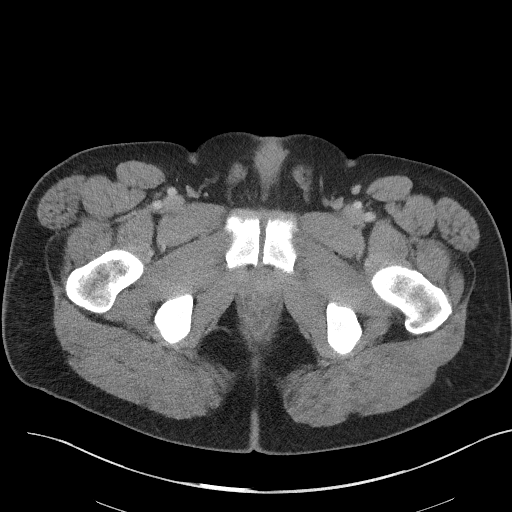
[im 30/104  soft-tissue]
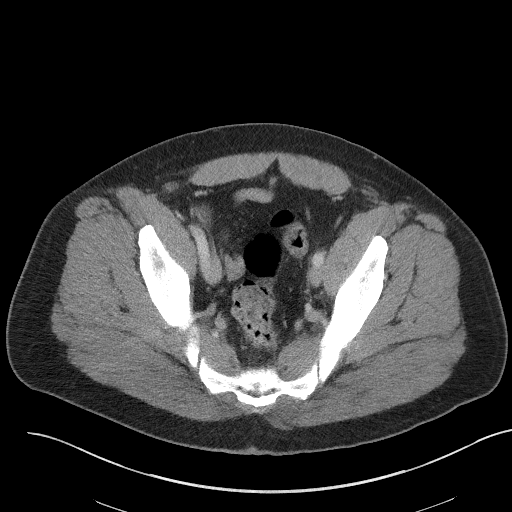
[im 45/104  soft-tissue]
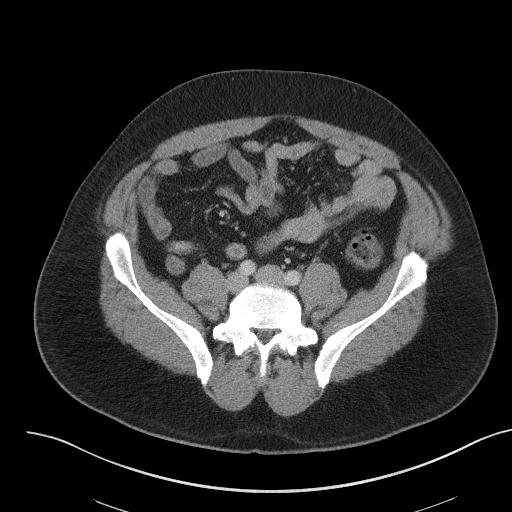
[im 59/104  soft-tissue]
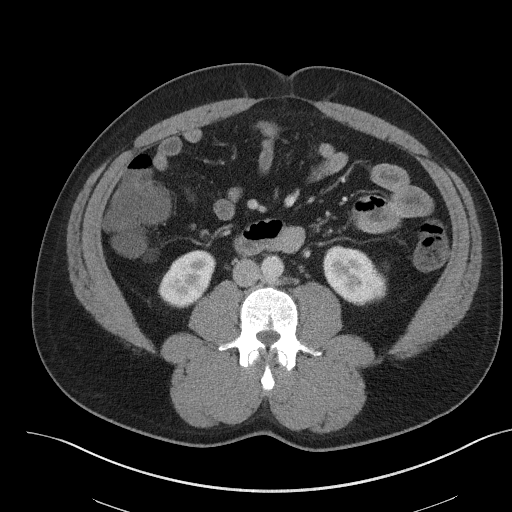
[im 74/104  soft-tissue]
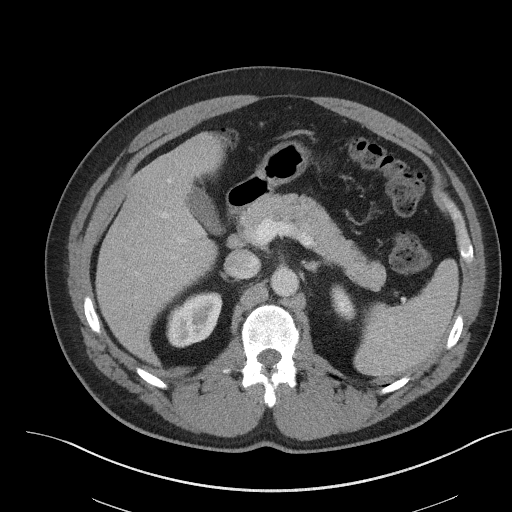
[im 89/104  soft-tissue]
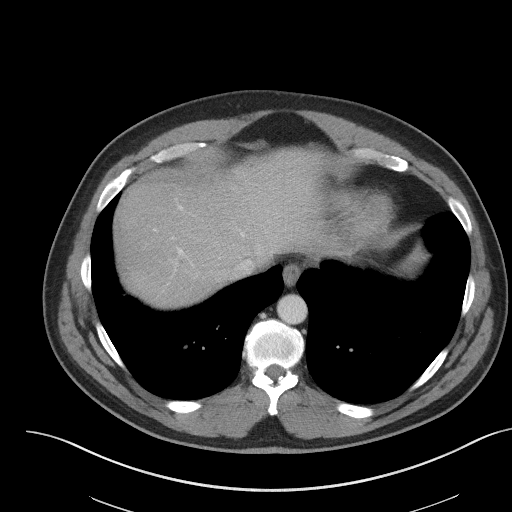

[12 of 32 positions shown; findings below may reference images not displayed]

FINDINGS: Lower chest: No acute abnormality. No pleural effusion. Normal size
heart. No pericardial effusion.

Hepatobiliary: Cyst in the left lobe of the liver. Similar smaller
lesion in the right lobe of the liver, technically too small to
accurately characterize but also favored to represent cysts.
Gallbladder is unremarkable. No biliary ductal dilation.

Pancreas: Unremarkable

Spleen: Unremarkable

Adrenals/Urinary Tract: Bilateral adrenal glands are unremarkable.
No hydronephrosis. Bilateral nonobstructive renal stones measuring
up to 2 mm. No significant change in size or appearance of the
cm complex cystic left interpolar renal cysts which demonstrates
internal septations and a similar focal area of thickening along the
posterior septation. Urinary bladder is unremarkable.

Stomach/Bowel: Stomach appears unremarkable for degree of
distension. Normal positioning of the duodenum/ligament of [place].
No suspicious small bowel wall thickening or dilation the appendix
and terminal ileum appear within normal limits. No suspicious
colonic wall thickening or mass like lesions.

Vascular/Lymphatic: No significant vascular findings are present. No
enlarged abdominal or pelvic lymph nodes.

Reproductive: Prostate is unremarkable.

Other: No abdominopelvic ascites.

Musculoskeletal: Multilevel degenerative changes spine. No acute
osseous abnormality.
IMPRESSION: 1. Bilateral nonobstructive nephrolithiasis.
2. No significant change in size or appearance, given differences in
technique, of the complex cystic left interpolar renal cyst
consistent with a Bosniak 2F cyst. As this lesion was better
characterized on prior MRI I would suggest continue annual
surveillance with renal protocol MRI with and without contrast. This
recommendation follows ACR consensus guidelines: Management of the
incidental Renal Mass on CT: A White Paper of the ACR Incidental
findings Committee. [HOSPITAL] [M9];[DATE].

## 2020-03-23 MED ORDER — IOHEXOL 300 MG/ML  SOLN
150.0000 mL | Freq: Once | INTRAMUSCULAR | Status: AC | PRN
  Administered 2020-03-23: 125 mL via INTRAVENOUS

## 2020-03-25 ENCOUNTER — Ambulatory Visit: Payer: BC Managed Care – PPO | Admitting: Urology

## 2020-03-25 ENCOUNTER — Other Ambulatory Visit: Payer: Self-pay

## 2020-03-25 VITALS — BP 166/66 | HR 47

## 2020-03-25 DIAGNOSIS — R3129 Other microscopic hematuria: Secondary | ICD-10-CM

## 2020-03-25 DIAGNOSIS — N281 Cyst of kidney, acquired: Secondary | ICD-10-CM

## 2020-03-25 NOTE — Progress Notes (Signed)
   03/25/20  CC:  Chief Complaint  Patient presents with  . Cysto    HPI: 50 year old male who presents today for further evaluation of brachy scopic hematuria  He underwent CT urogram on 03/23/2020 which showed some punctate bilateral stones up to 2 mm.  He also has a known Bosniak 1F renal lesion previously characterized on renal MRI.  Currently measures 1.9 cm in the left interpolar area with internal septation and thickening along the posterior septation.  Personal history of nephrolithiasis status post ESWL.  Vitals:   03/25/20 1511  BP: (!) 166/66  Pulse: (!) 47   NED. A&Ox3.   No respiratory distress   Abd soft, NT, ND Normal phallus with bilateral descended testicles  Cystoscopy Procedure Note  Patient identification was confirmed, informed consent was obtained, and patient was prepped using Betadine solution.  Lidocaine jelly was administered per urethral meatus.     Pre-Procedure: - Inspection reveals a normal caliber ureteral meatus.  Procedure: The flexible cystoscope was introduced without difficulty - No urethral strictures/lesions are present. - Enlarged prostate bilobar coaptation - Normal bladder neck - Bilateral ureteral orifices identified - Bladder mucosa  reveals no ulcers, tumors, or lesions - No bladder stones -Mild trabeculation  Retroflexion shows unremarkable   Post-Procedure: - Patient tolerated the procedure well  Assessment/ Plan:  1. Microscopic hematuria CT urogram fairly unremarkable other than punctate stones  Cystoscopy is also fairly unremarkable although he has slight prostatic enlargement with some mild trabeculation incidentally, asymptomatic - Urinalysis, Complete  2. Acquired cyst of kidney Bosniak 1F renal cyst on surveillance  Recommend follow-up with MRI in a year, he is agreeable this plan  - MR Abdomen W Wo Contrast; Future  Follow-up in 1 year with Carollee Herter for renal MRI  Vanna Scotland, MD

## 2020-03-26 LAB — URINALYSIS, COMPLETE
Bilirubin, UA: NEGATIVE
Glucose, UA: NEGATIVE
Ketones, UA: NEGATIVE
Leukocytes,UA: NEGATIVE
Nitrite, UA: NEGATIVE
Protein,UA: NEGATIVE
Specific Gravity, UA: 1.025 (ref 1.005–1.030)
Urobilinogen, Ur: 0.2 mg/dL (ref 0.2–1.0)
pH, UA: 6.5 (ref 5.0–7.5)

## 2020-03-26 LAB — MICROSCOPIC EXAMINATION
Bacteria, UA: NONE SEEN
Epithelial Cells (non renal): NONE SEEN /hpf (ref 0–10)

## 2020-05-03 ENCOUNTER — Encounter: Payer: Self-pay | Admitting: Family Medicine

## 2020-05-03 ENCOUNTER — Encounter: Payer: Self-pay | Admitting: *Deleted

## 2020-10-06 ENCOUNTER — Encounter: Payer: Self-pay | Admitting: Family Medicine

## 2020-10-06 DIAGNOSIS — F3342 Major depressive disorder, recurrent, in full remission: Secondary | ICD-10-CM

## 2020-10-06 MED ORDER — SERTRALINE HCL 100 MG PO TABS: 100.0000 mg | ORAL_TABLET | Freq: Every day | ORAL | 0 refills | Status: DC

## 2020-10-26 ENCOUNTER — Other Ambulatory Visit: Payer: Self-pay | Admitting: Family Medicine

## 2020-10-26 ENCOUNTER — Telehealth: Payer: Self-pay

## 2020-10-26 ENCOUNTER — Ambulatory Visit: Payer: BC Managed Care – PPO | Admitting: Family Medicine

## 2020-10-26 DIAGNOSIS — F3342 Major depressive disorder, recurrent, in full remission: Secondary | ICD-10-CM

## 2020-10-26 MED ORDER — SERTRALINE HCL 100 MG PO TABS: 100.0000 mg | ORAL_TABLET | Freq: Every day | ORAL | 0 refills | Status: DC

## 2020-10-26 NOTE — Telephone Encounter (Signed)
Pt had appt for today but had to cancel due to [facility] calling out. Pt was offered a slot with Dr Carlynn Purl but due to work was not able to wait until 9:40. He is asking for 1 month supply of Sertraline to be sent to [facility] Rock Nephew did reschedule with Raynelle Fanning for next week. Please contact pt

## 2020-10-26 NOTE — Telephone Encounter (Signed)
Refill was sent to [facility] .  

## 2020-11-03 ENCOUNTER — Other Ambulatory Visit: Payer: Self-pay

## 2020-11-03 ENCOUNTER — Ambulatory Visit: Payer: BC Managed Care – PPO | Admitting: Nurse Practitioner

## 2020-11-03 ENCOUNTER — Encounter: Payer: Self-pay | Admitting: Nurse Practitioner

## 2020-11-03 VITALS — BP 128/82 | HR 49 | Temp 98.0°F | Resp 18 | Ht 70.0 in | Wt 240.9 lb

## 2020-11-03 DIAGNOSIS — E785 Hyperlipidemia, unspecified: Secondary | ICD-10-CM | POA: Diagnosis not present

## 2020-11-03 DIAGNOSIS — I252 Old myocardial infarction: Secondary | ICD-10-CM

## 2020-11-03 DIAGNOSIS — R030 Elevated blood-pressure reading, without diagnosis of hypertension: Secondary | ICD-10-CM

## 2020-11-03 DIAGNOSIS — F3342 Major depressive disorder, recurrent, in full remission: Secondary | ICD-10-CM | POA: Diagnosis not present

## 2020-11-03 DIAGNOSIS — M791 Myalgia, unspecified site: Secondary | ICD-10-CM

## 2020-11-03 DIAGNOSIS — M2550 Pain in unspecified joint: Secondary | ICD-10-CM

## 2020-11-03 DIAGNOSIS — Z1211 Encounter for screening for malignant neoplasm of colon: Secondary | ICD-10-CM

## 2020-11-03 DIAGNOSIS — G4700 Insomnia, unspecified: Secondary | ICD-10-CM

## 2020-11-03 DIAGNOSIS — Z131 Encounter for screening for diabetes mellitus: Secondary | ICD-10-CM

## 2020-11-03 DIAGNOSIS — I480 Paroxysmal atrial fibrillation: Secondary | ICD-10-CM

## 2020-11-03 DIAGNOSIS — R5383 Other fatigue: Secondary | ICD-10-CM

## 2020-11-03 MED ORDER — SERTRALINE HCL 100 MG PO TABS: 100.0000 mg | ORAL_TABLET | Freq: Every day | ORAL | 3 refills | Status: DC

## 2020-11-03 MED ORDER — ROSUVASTATIN CALCIUM 5 MG PO TABS: 5.0000 mg | ORAL_TABLET | Freq: Every day | ORAL | 3 refills | Status: DC

## 2020-11-03 MED ORDER — NA SULFATE-K SULFATE-MG SULF 17.5-3.13-1.6 GM/177ML PO SOLN: 1.0000 | Freq: Once | ORAL | 0 refills | Status: AC

## 2020-11-03 NOTE — Progress Notes (Addendum)
BP 128/82   Pulse (!) 49   Temp 98 F (36.7 C) (Oral)   Resp 18   Ht 5' 10" (1.778 m)   Wt 240 lb 14.4 oz (109.3 kg)   SpO2 97%   BMI 34.57 kg/m    Subjective:    Patient ID: Melvin Hatfield, male    DOB: 1970-07-10, 50 y.o.   MRN: [id number]  HPI: Melvin Hatfield is a 50 y.o. male, here alone  Chief Complaint  Patient presents with   Hypertension   Depression    Follow up, medication refill   Depression/ Insomnia : He is currently on zoloft 100 mg daily.  He says he feels like his depression has been well controlled on this dose.  Current PHQ9 score is 0.  He says he has been sleeping well also.   Hyperlipidemia:  Last LDL was 74 on 10/30/2019.  He is currently on rosuvastatin 5 mg daily.  He is complaining of myalgia. But states this is not new, it has been going on for years.  Will check labs.  History of MI/ PAF:  Saw cardiology yesterday.  No concerns at this time.  He denies any chest pain or shortness of breath.  He is currently on Brilinta and ASA.  He has had no bouts of afib.    Blood pressure concern:  His blood pressure is 128/82 today.  He denies any chest pain or shortness of breath.  He saw his cardiologist yesterday and blood pressure was elevated, 145/77.  He is not currently on blood pressure medication.   Muscle cramps: He says he has had muscle cramps and pain for years.  He is currently on crestor 5 mg. He says that he talked to his cardiologist yesterday about it and it was recommended that we get a CK level today.  Order placed.    Joint pain: He says he has had joint pain for about 6 months. He says it is pretty much all his joints including; bilateral shoulders, elbows, knees, ankles, hips left is worse.  Will get labs.  Fatigue: he says he has had fatigue for a couple of months. He says he has been sleeping good, no napping during the day.  Just feels run down.  Will get labs.   Depression screen [facility] 2/9 11/03/2020 06/24/2019 04/21/2019 10/25/2018 12/28/2017   Decreased Interest 0 0 1 0 0  Down, Depressed, Hopeless 0 0 1 0 0  PHQ - 2 Score 0 0 2 0 0  Altered sleeping 0 0 3 0 -  Tired, decreased energy 0 0 3 0 -  Change in appetite 0 0 0 0 -  Feeling bad or failure about yourself  0 0 0 0 -  Trouble concentrating 0 0 1 0 -  Moving slowly or fidgety/restless 0 0 0 0 -  Suicidal thoughts 0 0 0 0 -  PHQ-9 Score 0 0 9 0 -  Difficult doing work/chores Not difficult at all Not difficult at all Somewhat difficult Not difficult at all -    Relevant past medical, surgical, family and social history reviewed and updated as indicated. Interim medical history since our last visit reviewed. Allergies and medications reviewed and updated.  Review of Systems  Constitutional: Negative for fever or weight change.  Respiratory: Negative for cough and shortness of breath.   Cardiovascular: Negative for chest pain or palpitations.  Gastrointestinal: Negative for abdominal pain, no bowel changes.  Musculoskeletal: Negative for gait problem or joint swelling. Positive  for joint and muscle pain Skin: Negative for rash.  Neurological: Negative for dizziness or headache.  No other specific complaints in a complete review of systems (except as listed in HPI above).      Objective:    BP 128/82   Pulse (!) 49   Temp 98 F (36.7 C) (Oral)   Resp 18   Ht 5' 10" (1.778 m)   Wt 240 lb 14.4 oz (109.3 kg)   SpO2 97%   BMI 34.57 kg/m   Wt Readings from Last 3 Encounters:  11/03/20 240 lb 14.4 oz (109.3 kg)  02/18/20 230 lb (104.3 kg)  06/24/19 225 lb (102.1 kg)    Physical Exam  Constitutional: Patient appears well-developed and well-nourished. No distress.  HEENT: head atraumatic, normocephalic, pupils equal and reactive to light, neck supple, throat within normal limits Cardiovascular: Normal rate, regular rhythm and normal heart sounds.  No murmur heard. No BLE edema. Pulmonary/Chest: Effort normal and breath sounds normal. No respiratory  distress. Abdominal: Soft.  There is no tenderness. Psychiatric: Patient has a normal mood and affect. behavior is normal. Judgment and thought content normal.   Results for orders placed or performed in visit on 03/25/20  Microscopic Examination   Urine  Result Value Ref Range   WBC, UA 0-5 0 - 5 /hpf   RBC 3-10 (A) 0 - 2 /hpf   Epithelial Cells (non renal) None seen 0 - 10 /hpf   Bacteria, UA None seen None seen/Few  Urinalysis, Complete  Result Value Ref Range   Specific Gravity, UA 1.025 1.005 - 1.030   pH, UA 6.5 5.0 - 7.5   Color, UA Yellow Yellow   Appearance Ur Clear Clear   Leukocytes,UA Negative Negative   Protein,UA Negative Negative/Trace   Glucose, UA Negative Negative   Ketones, UA Negative Negative   RBC, UA Trace (A) Negative   Bilirubin, UA Negative Negative   Urobilinogen, Ur 0.2 0.2 - 1.0 mg/dL   Nitrite, UA Negative Negative   Microscopic Examination See below:       Assessment & Plan:   1. Major depressive disorder, recurrent episode, in full remission ([place])  - sertraline (ZOLOFT) 100 MG tablet; Take 1 tablet (100 mg total) by mouth daily.  Dispense: 90 tablet; Refill: 3  2. Insomnia, unspecified type   3. Hyperlipidemia LDL goal <70  - COMPLETE METABOLIC PANEL WITH GFR - Lipid panel - rosuvastatin (CRESTOR) 5 MG tablet; Take 1 tablet (5 mg total) by mouth at bedtime.  Dispense: 90 tablet; Refill: 3  4. 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 PAF (paroxysmal atrial fibrillation) ([place])   6. Borderline blood pressure -continue monitoring blood pressure  7. Myalgia  - CK (Creatine Kinase)  8. Arthralgia, unspecified joint  - Uric acid - Sed Rate (ESR) - C-reactive protein - ANA  9. Other fatigue  - CBC with Differential/Platelet - COMPLETE METABOLIC PANEL WITH GFR - TSH - VITAMIN D 25 Hydroxy (Vit-D Deficiency, Fractures) - Vitamin  B12  10. Hyperlipidemia LDL goal <70  - COMPLETE METABOLIC PANEL WITH GFR - Lipid panel - rosuvastatin (CRESTOR) 5 MG tablet; Take 1 tablet (5 mg total) by mouth at bedtime.  Dispense: 90 tablet; Refill: 3  11. Screening for diabetes mellitus  - CBC with Differential/Platelet - COMPLETE METABOLIC PANEL WITH GFR - Hemoglobin A1c   12. Screening for colon cancer  - Ambulatory referral  to Gastroenterology   Follow up plan: Return in about 6 months (around 05/04/2021) for follow up.

## 2020-11-03 NOTE — Addendum Note (Signed)
Addended by: Della Goo F on: 11/03/2020 10:41 AM   Modules accepted: Orders

## 2020-11-03 NOTE — Progress Notes (Signed)
Gastroenterology Pre-Procedure Review  Request Date: 12/24/20 Requesting Physician: Dr. Servando Snare  PATIENT REVIEW QUESTIONS: The patient responded to the following health history questions as indicated:    1. Are you having any GI issues? no 2. Do you have a personal history of Polyps? no 3. Do you have a family history of Colon Cancer or Polyps? no 4. Diabetes Mellitus? no 5. Joint replacements in the past 12 months?no 6. Major health problems in the past 3 months?no 7. Any artificial heart valves, MVP, or defibrillator? Paroxysmal atrial fibrillation    MEDICATIONS & ALLERGIES:    Patient reports the following regarding taking any anticoagulation/antiplatelet therapy:   Plavix, Coumadin, Eliquis, Xarelto, Lovenox, Pradaxa, Brilinta, or Effient? yes (Brillinta 90 mg) Aspirin? yes (81 mg)  Patient confirms/reports the following medications:  Current Outpatient Medications  Medication Sig Dispense Refill   aspirin 81 MG chewable tablet Chew 81 mg by mouth.     omeprazole (PRILOSEC) 20 MG capsule Take 1 capsule (20 mg total) by mouth daily. 30 capsule 3   rosuvastatin (CRESTOR) 5 MG tablet Take 1 tablet (5 mg total) by mouth at bedtime. 90 tablet 3   sertraline (ZOLOFT) 100 MG tablet Take 1 tablet (100 mg total) by mouth daily. 90 tablet 3   ticagrelor (BRILINTA) 90 MG TABS tablet Take 1 tablet (90 mg total) by mouth 2 (two) times daily. 60 tablet 0   No current facility-administered medications for this visit.    Patient confirms/reports the following allergies:  No Known Allergies  No orders of the defined types were placed in this encounter.   AUTHORIZATION INFORMATION Primary Insurance: 1D#: Group #:  Secondary Insurance: 1D#: Group #:  SCHEDULE INFORMATION: Date: 12/24/20 Time: Location: [facility]

## 2020-11-04 ENCOUNTER — Other Ambulatory Visit: Payer: Self-pay | Admitting: Nurse Practitioner

## 2020-11-04 DIAGNOSIS — M1A09X0 Idiopathic chronic gout, multiple sites, without tophus (tophi): Secondary | ICD-10-CM

## 2020-11-04 DIAGNOSIS — E785 Hyperlipidemia, unspecified: Secondary | ICD-10-CM

## 2020-11-04 MED ORDER — ALLOPURINOL 100 MG PO TABS: 100.0000 mg | ORAL_TABLET | Freq: Every day | ORAL | 1 refills | Status: DC

## 2020-11-04 MED ORDER — EZETIMIBE 10 MG PO TABS: 10.0000 mg | ORAL_TABLET | Freq: Every day | ORAL | 3 refills | Status: DC

## 2020-11-05 LAB — CBC WITH DIFFERENTIAL/PLATELET
Absolute Monocytes: 407 cells/uL (ref 200–950)
Basophils Absolute: 59 cells/uL (ref 0–200)
Basophils Relative: 1 %
Eosinophils Absolute: 271 cells/uL (ref 15–500)
Eosinophils Relative: 4.6 %
HCT: 42.8 % (ref 38.5–50.0)
Hemoglobin: 14.7 g/dL (ref 13.2–17.1)
Lymphs Abs: 1322 cells/uL (ref 850–3900)
MCH: 30.9 pg (ref 27.0–33.0)
MCHC: 34.3 g/dL (ref 32.0–36.0)
MCV: 89.9 fL (ref 80.0–100.0)
MPV: 10.2 fL (ref 7.5–12.5)
Monocytes Relative: 6.9 %
Neutro Abs: 3841 cells/uL (ref 1500–7800)
Neutrophils Relative %: 65.1 %
Platelets: 248 10*3/uL (ref 140–400)
RBC: 4.76 10*6/uL (ref 4.20–5.80)
RDW: 13.8 % (ref 11.0–15.0)
Total Lymphocyte: 22.4 %
WBC: 5.9 10*3/uL (ref 3.8–10.8)

## 2020-11-05 LAB — COMPLETE METABOLIC PANEL WITH GFR
AG Ratio: 2.1 (calc) (ref 1.0–2.5)
ALT: 22 U/L (ref 9–46)
AST: 22 U/L (ref 10–40)
Albumin: 4.5 g/dL (ref 3.6–5.1)
Alkaline phosphatase (APISO): 57 U/L (ref 36–130)
BUN: 18 mg/dL (ref 7–25)
CO2: 30 mmol/L (ref 20–32)
Calcium: 9.7 mg/dL (ref 8.6–10.3)
Chloride: 104 mmol/L (ref 98–110)
Creat: 1.16 mg/dL (ref 0.60–1.29)
Globulin: 2.1 g/dL (calc) (ref 1.9–3.7)
Glucose, Bld: 113 mg/dL — ABNORMAL HIGH (ref 65–99)
Potassium: 4.3 mmol/L (ref 3.5–5.3)
Sodium: 141 mmol/L (ref 135–146)
Total Bilirubin: 1.1 mg/dL (ref 0.2–1.2)
Total Protein: 6.6 g/dL (ref 6.1–8.1)
eGFR: 77 mL/min/{1.73_m2} (ref >=60)

## 2020-11-05 LAB — VITAMIN D 25 HYDROXY (VIT D DEFICIENCY, FRACTURES): Vit D, 25-Hydroxy: 33 ng/mL (ref 30–100)

## 2020-11-05 LAB — LIPID PANEL
Cholesterol: 152 mg/dL (ref <200)
HDL: 62 mg/dL (ref >=40)
LDL Cholesterol (Calc): 66 mg/dL (calc)
Non-HDL Cholesterol (Calc): 90 mg/dL (calc) (ref <130)
Total CHOL/HDL Ratio: 2.5 (calc) (ref <5.0)
Triglycerides: 162 mg/dL — ABNORMAL HIGH (ref <150)

## 2020-11-05 LAB — SEDIMENTATION RATE: Sed Rate: 9 mm/h (ref 0–15)

## 2020-11-05 LAB — CK: Total CK: 213 U/L — ABNORMAL HIGH (ref 44–196)

## 2020-11-05 LAB — C-REACTIVE PROTEIN: CRP: 1.6 mg/L (ref <8.0)

## 2020-11-05 LAB — TSH: TSH: 2.93 mIU/L (ref 0.40–4.50)

## 2020-11-05 LAB — VITAMIN B12: Vitamin B-12: 451 pg/mL (ref 200–1100)

## 2020-11-05 LAB — HEMOGLOBIN A1C
Hgb A1c MFr Bld: 5.7 % of total Hgb — ABNORMAL HIGH (ref <5.7)
Mean Plasma Glucose: 117 mg/dL
eAG (mmol/L): 6.5 mmol/L

## 2020-11-05 LAB — URIC ACID: Uric Acid, Serum: 8.3 mg/dL — ABNORMAL HIGH (ref 4.0–8.0)

## 2020-11-05 LAB — ANA: Anti Nuclear Antibody (ANA): NEGATIVE

## 2020-12-15 ENCOUNTER — Encounter: Payer: Self-pay | Admitting: Gastroenterology

## 2020-12-15 ENCOUNTER — Telehealth: Payer: Self-pay

## 2020-12-15 ENCOUNTER — Encounter: Payer: Self-pay | Admitting: Anesthesiology

## 2020-12-15 NOTE — Telephone Encounter (Signed)
Called to informed patient of Blood thinner clearance. Per Charleston Poot, NP: patient may hold Brillinta (5) days prior to procedure. Left message explaining clearance.

## 2020-12-20 ENCOUNTER — Telehealth: Payer: Self-pay | Admitting: Gastroenterology

## 2020-12-20 NOTE — Telephone Encounter (Signed)
Inbound call from pt stating that he needs to cxl his procedure that is sch'd for 12/24/20. Pt requested a call back but wants to schedule for sometime in January.

## 2020-12-21 NOTE — Telephone Encounter (Signed)
Returned patients call regarding cancelling procedure. [facility] has been notified of change. Procedure has been cancelled.

## 2020-12-24 ENCOUNTER — Ambulatory Visit
Admission: RE | Admit: 2020-12-24 | Payer: BC Managed Care – PPO | Source: Home / Self Care | Admitting: Gastroenterology

## 2020-12-24 HISTORY — DX: Paroxysmal atrial fibrillation: I48.0

## 2020-12-24 HISTORY — DX: Presence of spectacles and contact lenses: Z97.3

## 2020-12-24 SURGERY — COLONOSCOPY WITH PROPOFOL
Anesthesia: Choice

## 2021-03-16 ENCOUNTER — Ambulatory Visit
Admission: RE | Admit: 2021-03-16 | Discharge: 2021-03-16 | Disposition: A | Discharge status: Home / Self Care | Payer: BC Managed Care – PPO | Source: Ambulatory Visit | Attending: Urology | Admitting: Urology

## 2021-03-16 DIAGNOSIS — N281 Cyst of kidney, acquired: Secondary | ICD-10-CM | POA: Diagnosis not present

## 2021-03-16 IMAGING — MR MR ABDOMEN WO/W CM
17 series · 48 of 48 positions shown · IV contrast (gadavist)
Comparison: Multiple previous imaging studies dating back to [WG].
The most recent MR examination is [DATE].

CLINICAL DATA: Follow-up complex renal cyst.

EXAM:
MRI ABDOMEN WITHOUT AND WITH CONTRAST
TECHNIQUE: Multiplanar multisequence MR imaging of the abdomen was performed
both before and after the administration of intravenous contrast.
CONTRAST:  10mL GADAVIST GADOBUTROL 1 MMOL/ML IV SOLN

[Series 3: T2 · coronal · 6.0mm · 1.19mm/px · 2 of 30 slices shown (1 of 2)]
[im 1/30]
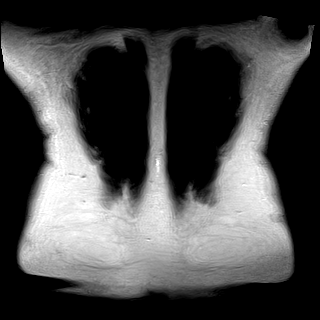
[im 30/30]
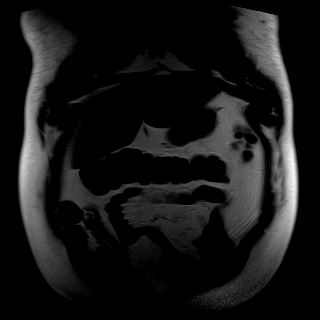

[Series 4: T2 · axial · 6.0mm · 1.19mm/px · z∈[-139,+83]mm · 2 of 32 slices shown (2 of 2)]
[im 1/32]
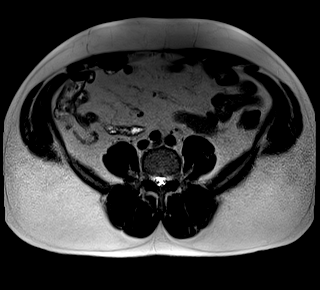
[im 32/32]
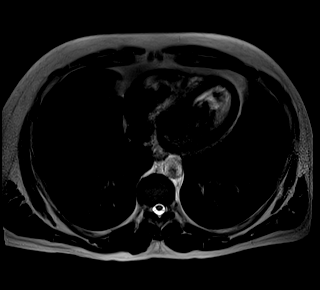

[Series 5: T1 · axial · 3.0mm · 1.19mm/px · z∈[-142,+71]mm · 4 of 72 slices shown (1 of 2)]
[im 1/72]
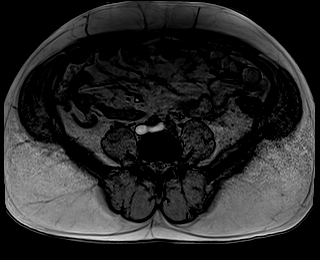
[im 24/72]
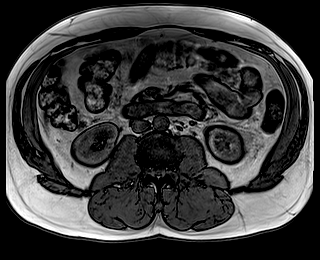
[im 48/72]
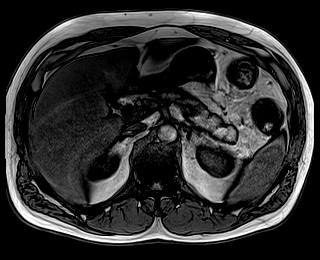
[im 72/72]
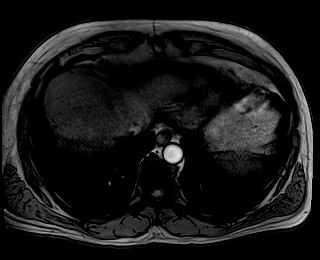

[Series 6: T1 · axial · 3.0mm · 1.19mm/px · z∈[-142,+71]mm · 4 of 72 slices shown (2 of 2)]
[im 1/72]
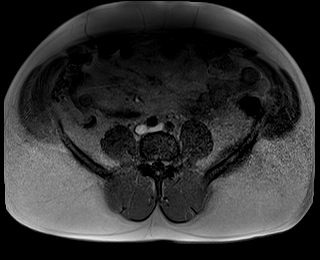
[im 24/72]
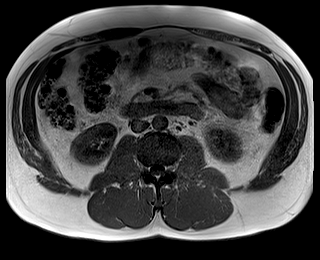
[im 48/72]
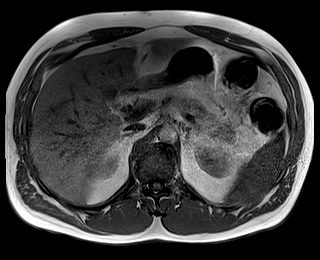
[im 72/72]
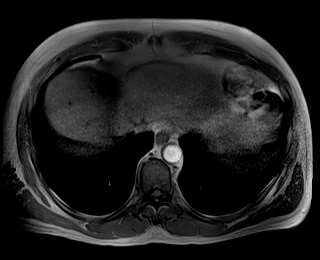

[Series 9: T2 fat-sat · axial · 6.0mm · 1.19mm/px · 1 of 30 slices shown]
[im 1/30]
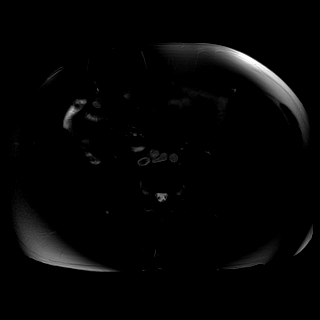

[Series 10: ax dwi_tracew · axial · 6.0mm · 1.42mm/px · z∈[-143,+65]mm · 4 of 90 slices shown]
[im 1/90]
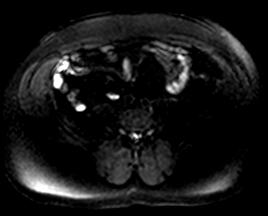
[im 30/90]
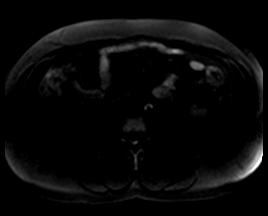
[im 60/90]
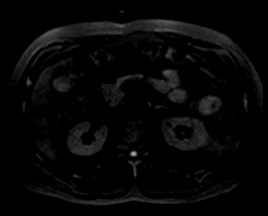
[im 90/90]
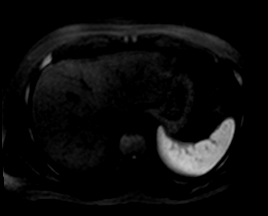

[Series 11: ax dwi_adc · axial · 6.0mm · 1.42mm/px · 1 of 30 slices shown]
[im 1/30]
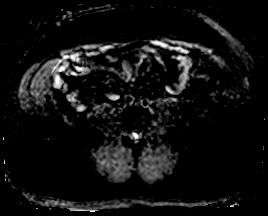

[Series 12: T1 dynamic fat-sat · axial · non-contrast · 3.0mm · 1.19mm/px · z∈[-134,+78]mm · 3 of 72 slices shown (1 of 5)]
[im 1/72]
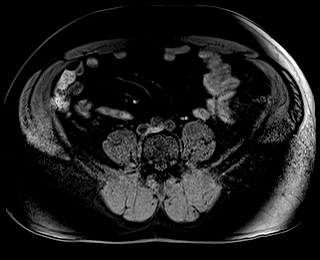
[im 36/72]
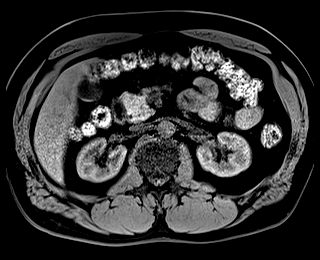
[im 72/72]
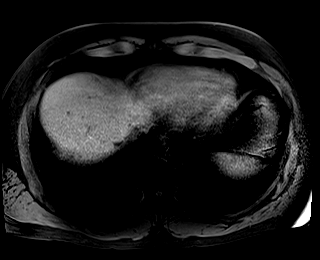

[Series 13: T1 dynamic fat-sat post-contrast · axial · 3.0mm · 1.19mm/px · z∈[-134,+78]mm · 3 of 72 slices shown (1 of 4)]
[im 1/72]
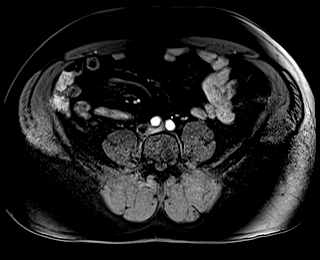
[im 36/72]
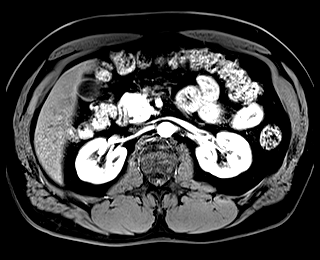
[im 72/72]
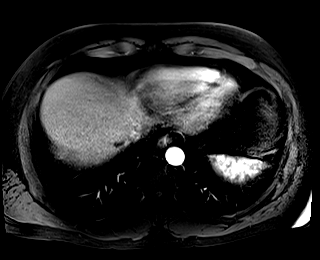

[Series 14: T1 dynamic fat-sat · axial · 3.0mm · 1.19mm/px · z∈[-134,+78]mm · 3 of 72 slices shown (2 of 5)]
[im 1/72]
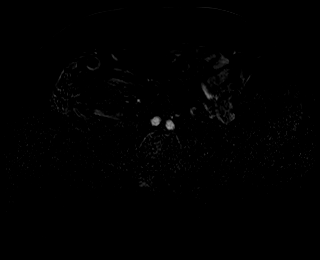
[im 36/72]
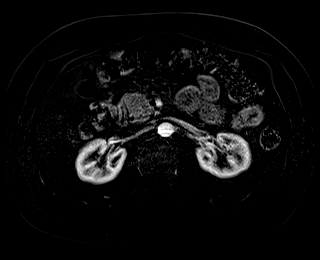
[im 72/72]
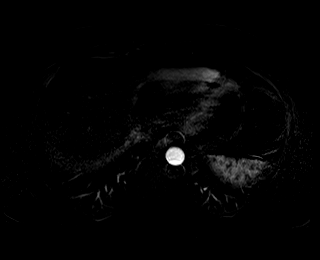

[Series 15: T1 dynamic fat-sat post-contrast · axial · 3.0mm · 1.19mm/px · z∈[-134,+78]mm · 3 of 72 slices shown (2 of 4)]
[im 1/72]
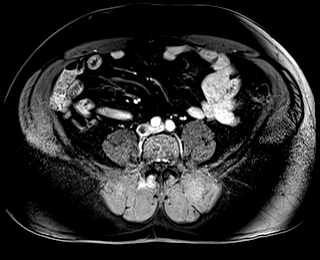
[im 36/72]
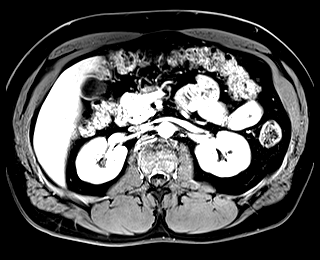
[im 72/72]
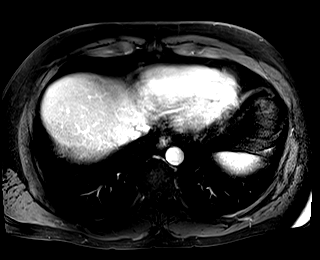

[Series 16: T1 dynamic fat-sat · axial · 3.0mm · 1.19mm/px · z∈[-134,+78]mm · 3 of 72 slices shown (3 of 5)]
[im 1/72]
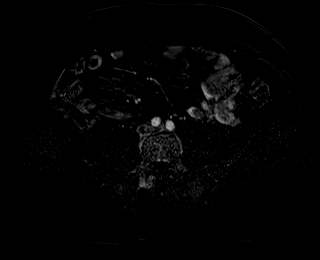
[im 36/72]
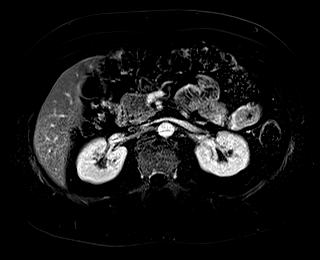
[im 72/72]
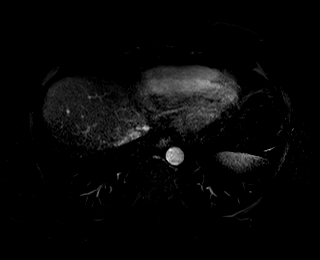

[Series 17: T1 dynamic fat-sat post-contrast · axial · 3.0mm · 1.19mm/px · z∈[-134,+78]mm · 3 of 72 slices shown (3 of 4)]
[im 1/72]
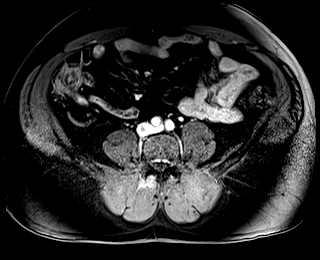
[im 36/72]
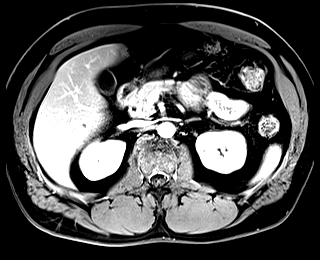
[im 72/72]
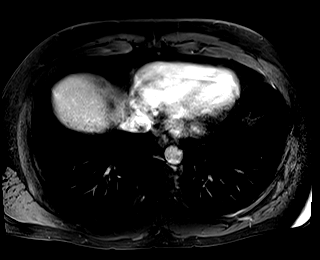

[Series 18: T1 dynamic fat-sat · axial · 3.0mm · 1.19mm/px · z∈[-134,+78]mm · 3 of 72 slices shown (4 of 5)]
[im 1/72]
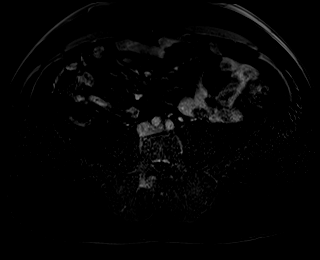
[im 36/72]
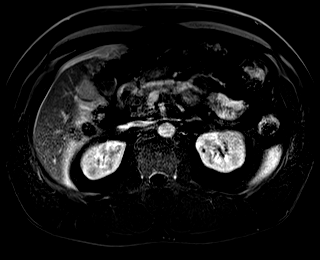
[im 72/72]
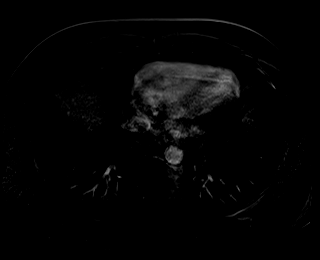

[Series 19: T1 dynamic post-contrast · coronal · 3.0mm · 1.31mm/px · 3 of 72 slices shown]
[im 1/72]
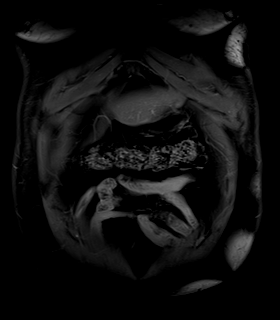
[im 36/72]
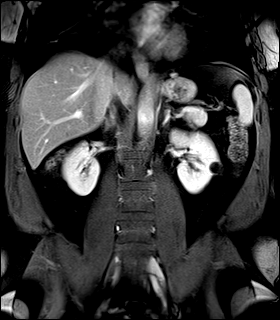
[im 72/72]
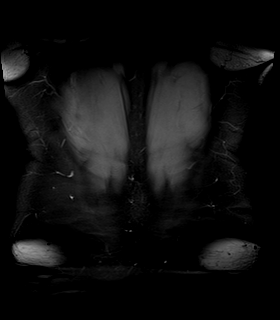

[Series 20: T1 dynamic fat-sat post-contrast · axial · 3.0mm · 1.19mm/px · z∈[-134,+78]mm · 3 of 72 slices shown (4 of 4)]
[im 1/72]
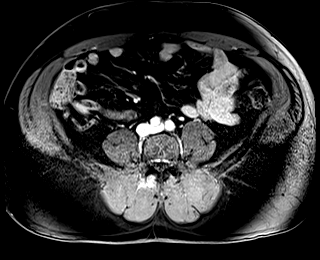
[im 36/72]
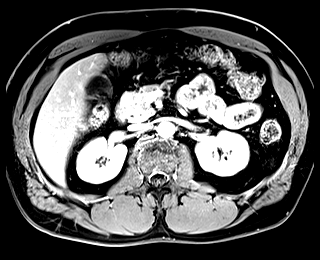
[im 72/72]
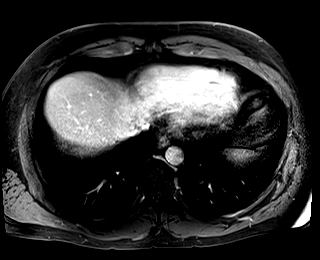

[Series 21: T1 dynamic fat-sat · axial · 3.0mm · 1.19mm/px · z∈[-134,+78]mm · 3 of 72 slices shown (5 of 5)]
[im 1/72]
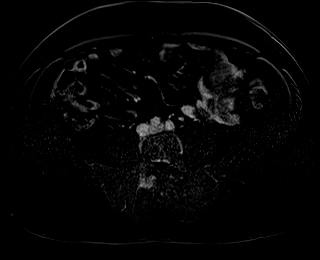
[im 36/72]
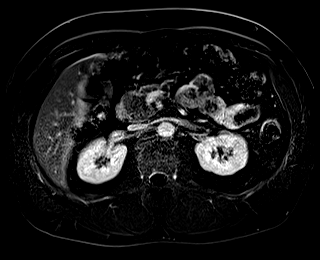
[im 72/72]
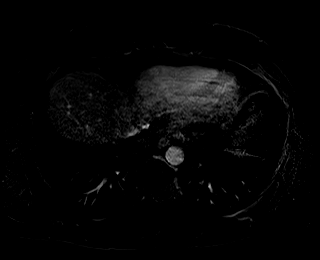

[48 of 48 positions shown; findings below may reference images not displayed]

FINDINGS: Lower chest: The lung bases are clear of an acute process. No
pulmonary lesions, pleural or pericardial effusion.

Hepatobiliary: No hepatic lesions or intrahepatic biliary
dilatation. The gallbladder is unremarkable. Normal caliber and
course of the common bile duct.

Pancreas:  No mass, inflammation or ductal dilatation.

Spleen:  Normal size. No focal lesions.

Adrenals/Urinary Tract:  The adrenal glands are normal.

There is a stable septated cyst associated with the midpole lower
pole junction region of the left kidney laterally. This measures 21
x 18 mm on image [DATE]. In [WG] it measured 19 x 15 mm. No
appreciable contrast enhancement is identified. This is likely a
benign septated cyst. It has been previously characterized as a
Bosniak 2F lesion and because of slight interval enlargement since
[WG] I would recommend a follow-up MRI in 2 years to confer 5 years
of stability.

The right kidney is unremarkable.

Stomach/Bowel: Stomach, duodenum, visualized small bowel and
visualized colon are grossly normal.

Vascular/Lymphatic: The aorta and branch vessels are normal. The
major venous structures are normal. No mesenteric or retroperitoneal
mass or adenopathy.

Other:  No ascites or abdominal wall hernia.

Musculoskeletal: No significant bony findings.
IMPRESSION: 1. Stable septated cyst associated with the midpole lower pole
junction region of the left kidney laterally. This is likely a
benign septated cyst. It has been previously characterized as a
Bosniak 2F lesion and because of slight interval enlargement since
[WG] I would recommend a follow-up MRI in 2 years to confer 5 years
of stability.
2. No other significant abdominal findings.

## 2021-03-16 MED ORDER — GADOBUTROL 1 MMOL/ML IV SOLN
10.0000 mL | Freq: Once | INTRAVENOUS | Status: AC | PRN
  Administered 2021-03-16: 10 mL via INTRAVENOUS

## 2021-03-30 NOTE — Progress Notes (Signed)
3:35 PM   Tag Wurtz 01-Aug-1970 [id number]  Referring provider: Danelle Berry, PA-C [address] [place],  [place] [address]  Chief Complaint  Patient presents with   Hematuria   Benign Prostatic Hypertrophy   Nephrolithiasis   Urological history 1. BPH with LU TS -PSA pending -cysto 2017 and 2022 noted enlarged prostate, elevated bladder neck and a mass effect of the prostate to the bladder -I PSS 12/1  2. Nephrolithiasis - left ESWL 03/2017 - Stone composition 74% Calcium Oxalate Monohydrate, 23% Calcium Oxalate Dihydrate and 3% Calcium Phosphate Carbonate - 8.2 mm nonobstructive stone in the lower left kidney on 09/2018 RUS  3. Intermediate risk hematuria - Non-smoker - CTU 2017 and 2022 - Bosniak 67F left renal cyst - cysto 03/2015 and 2022 NED - no reports of gross heme -UA 3-10 RBC's   4. Left Renal Bosniak 67F cyst - MRI 2023 - There is a stable septated cyst associated with the midpole lower pole junction region of the left kidney laterally. This measures 21 x 18 mm on image 20/4. In 2020 it measured 19 x 15 mm. No appreciable contrast enhancement is identified. This is likely a benign septated cyst. It has been previously characterized as a Bosniak 67F lesion and because of slight interval enlargement since 2020 I would recommend a follow-up MRI in 2 years to confer 5 years of stability.   HPI: Melvin Hatfield is a 51 y.o. male who presents today for yearly follow up.    No urinary complaints.  Patient denies any modifying or aggravating factors.  Patient denies any gross hematuria, dysuria or suprapubic/flank pain.  Patient denies any fevers, chills, nausea or vomiting.      IPSS     Row Name 03/31/21 1500         International Prostate Symptom Score   How often have you had the sensation of not emptying your bladder? Less than half the time     How often have you had to urinate less than every two hours? About half the time     How often have  you found you stopped and started again several times when you urinated? Less than 1 in 5 times     How often have you found it difficult to postpone urination? Less than 1 in 5 times     How often have you had a weak urinary stream? Less than 1 in 5 times     How often have you had to strain to start urination? Less than 1 in 5 times     How many times did you typically get up at night to urinate? 3 Times     Total IPSS Score 12       Quality of Life due to urinary symptoms   If you were to spend the rest of your life with your urinary condition just the way it is now how would you feel about that? Pleased               Score:  1-7 Mild 8-19 Moderate 20-35 Severe  Patient still having spontaneous erections.  He denies any pain or curvature with erections.     SHIM     Row Name 03/31/21 1521         SHIM: Over the last 6 months:   How do you rate your confidence that you could get and keep an erection? Very High     When you had erections with sexual  stimulation, how often were your erections hard enough for penetration (entering your partner)? Almost Always or Always     During sexual intercourse, how often were you able to maintain your erection after you had penetrated (entered) your partner? Almost Always or Always     During sexual intercourse, how difficult was it to maintain your erection to completion of intercourse? Not Difficult     When you attempted sexual intercourse, how often was it satisfactory for you? 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    Myocardial infarction (HCC) 05/19/2014   Paroxysmal atrial fibrillation with rapid ventricular response (HCC)    Sleep apnea    CPAP   Wears contact lenses     Surgical History: Past Surgical History:  Procedure Laterality Date   CORONARY ANGIOPLASTY WITH STENT PLACEMENT  05/20/2015   EXTRACORPOREAL SHOCK WAVE LITHOTRIPSY Left 04/05/2017   Procedure: EXTRACORPOREAL SHOCK WAVE LITHOTRIPSY (ESWL);  Surgeon: Vanna Scotland, MD;  Location: [facility] ORS;  Service: Urology;  Laterality: Left;   reconstructed ear drum     SPINE SURGERY     C6-C7 fusion.  Approx 2007   TYMPANOSTOMY TUBE PLACEMENT     patient states he has had 6    Home Medications:  Allergies as of 03/31/2021   No Known Allergies      Medication List        Accurate as of March 31, 2021  3:35 PM. If you have any questions, ask your nurse or doctor.          allopurinol 100 MG tablet Commonly known as: ZYLOPRIM Take 1 tablet (100 mg total) by mouth daily.   APPLE CIDER VINEGAR PO Take by mouth daily.   aspirin 81 MG chewable tablet Chew 81 mg by mouth.   ezetimibe 10 MG tablet Commonly known as: Zetia Take 1 tablet (10 mg total) by mouth daily.   MULTIVITAMIN ADULT PO Take by mouth daily.   omeprazole 20 MG capsule Commonly known as: PRILOSEC Take 1 capsule (20 mg total) by mouth daily.   PREVAGEN PO Take by mouth daily.   sertraline 100 MG tablet Commonly known as: ZOLOFT Take 1 tablet (100 mg total) by mouth daily.   Tart Cherry Advanced Caps Take by mouth daily.   ticagrelor 90 MG Tabs tablet Commonly known as: Brilinta Take 1 tablet (90 mg total) by mouth 2 (two) times daily.   TURMERIC PO Take by mouth daily.        Allergies: No Known Allergies  Family History: Family History  Problem Relation Age of Onset   Cancer Mother    Depression Mother    Stroke  Maternal Grandmother    Stroke Maternal Grandfather    Kidney disease Neg Hx    Prostate cancer Neg Hx     Social History:  reports that  he has never smoked. He has never used smokeless tobacco. He reports current alcohol use. He reports that he does not use drugs.    ROS Pertinent ROS in HPI  Physical Exam: Vitals:   03/31/21 1456  BP: (!) 161/78  Pulse: (!) 48    Constitutional:  Well nourished. Alert and oriented, No acute distress. HEENT:  AT, mask in place trachea midline Cardiovascular: No clubbing, cyanosis, or edema. Respiratory: Normal respiratory effort, no increased work of breathing. Neurologic: Grossly intact, no focal deficits, moving all 4 extremities. Psychiatric: Normal mood and affect.   Laboratory Data: Component Ref Range & Units 4 mo ago  Hgb A1c MFr Bld <5.7 % of total Hgb 5.7 High    Comment: For someone without known diabetes, a hemoglobin  A1c value between 5.7% and 6.4% is consistent with  prediabetes and should be confirmed with a  follow-up test.  .  For someone with known diabetes, a value <7%  indicates that their diabetes is well controlled. A1c  targets should be individualized based on duration of  diabetes, age, comorbid conditions, and other  considerations.  .  This assay result is consistent with an increased risk  of diabetes.  .  Currently, no consensus exists regarding use of  hemoglobin A1c for diagnosis of diabetes for children.  .   Mean Plasma Glucose mg/dL 735   eAG (mmol/L) mmol/L 6.5   Resulting Agency  [facility] [place]         Specimen Collected: 11/03/20 09:50 Last Resulted: 11/05/20 04:29         Component Ref Range & Units 4 mo ago  TSH 0.40 - 4.50 mIU/L 2.93   Resulting Agency  [facility] [place]         Specimen Collected: 11/03/20 09:50 Last Resulted: 11/05/20 04:29         Uric Acid, Serum 4.0 - 8.0 mg/dL 8.3 High    Comment: Therapeutic target for gout patients: <6.0  mg/dL  .   Resulting Agency  [facility] [place]         Specimen Collected: 11/03/20 09:50 Last Resulted: 11/05/20 04:29       Cholesterol <200 mg/dL 670  141 High    HDL > OR = 40 mg/dL 62  45 R   Triglycerides <150 mg/dL 030 High   131 High    LDL Cholesterol (Calc) mg/dL (calc) 66    Comment: Reference range: <100  .  Desirable range <100 mg/dL for primary prevention;    <70 mg/dL for patients with CHD or diabetic patients  with > or = 2 CHD risk factors.  Marland Kitchen  LDL-C is now calculated using the Martin-Hopkins  calculation, which is a validated novel method providing  better accuracy than the Friedewald equation in the  estimation of LDL-C.  Horald Pollen et al. Lenox Ahr. 4388;875(79): 2061-2068  (http://education.[facility].com/faq/FAQ164)   Total CHOL/HDL Ratio <5.0 (calc) 2.5  4.8 R   Non-HDL Cholesterol (Calc) <130 mg/dL (calc) 90    Comment: For patients with diabetes plus 1 major ASCVD risk  factor, treating to a non-HDL-C goal of <100 mg/dL  (LDL-C of <72 mg/dL) is considered a therapeutic  option.   Resulting Agency  [facility] [place]         Specimen Collected: 11/03/20 09:50 Last Resulted: 11/05/20 04:29       CMP Latest Ref Rng & Units 11/03/2020 08/17/2016 03/02/2016  Glucose 65 - 99 mg/dL 820(U) 015(I) 98  BUN 7 -  25 mg/dL 18 15 13   Creatinine 0.60 - 1.29 mg/dL 5.99 3.57  Sodium 135 - 146 mmol/L 141 141 142  Potassium 3.5 - 5.3 mmol/L 4.3 5.0 4.5  Chloride 98 - 110 mmol/L 104 104 101  CO2 20 - 32 mmol/L 30 29 23   Calcium 8.6 - 10.3 mg/dL 9.7 9.7 9.4  Total Protein 6.1 - 8.1 g/dL 6.6 7.0 -  Total Bilirubin 0.2 - 1.2 mg/dL 1.1 0.8 -  Alkaline Phos 40 - 115 U/L - 70 -  AST 10 - 40 U/L 22 22 -  ALT 9 - 46 U/L 22 23 -    CBC Latest Ref Rng & Units 11/03/2020 08/17/2016  WBC 3.8 - 10.8 Thousand/uL 5.9 -  Hemoglobin 13.2 - 17.1 g/dL 01/03/2021 08/19/2016  Hematocrit 79.3 - 50.0 % 42.8 -  Platelets 140 - 400 Thousand/uL 248 -  I have  reviewed the labs.  Pertinent Imaging CLINICAL DATA:  Follow-up complex renal cyst.   EXAM: MRI ABDOMEN WITHOUT AND WITH CONTRAST   TECHNIQUE: Multiplanar multisequence MR imaging of the abdomen was performed both before and after the administration of intravenous contrast.   CONTRAST:  31mL GADAVIST GADOBUTROL 1 MMOL/ML IV SOLN   COMPARISON:  Multiple previous imaging studies dating back to 2017. The most recent MR examination is 07/07/2019.   FINDINGS: Lower chest: The lung bases are clear of an acute process. No pulmonary lesions, pleural or pericardial effusion.   Hepatobiliary: No hepatic lesions or intrahepatic biliary dilatation. The gallbladder is unremarkable. Normal caliber and course of the common bile duct.   Pancreas:  No mass, inflammation or ductal dilatation.   Spleen:  Normal size. No focal lesions.   Adrenals/Urinary Tract:  The adrenal glands are normal.   There is a stable septated cyst associated with the midpole lower pole junction region of the left kidney laterally. This measures 21 x 18 mm on image 20/4. In 2020 it measured 19 x 15 mm. No appreciable contrast enhancement is identified. This is likely a benign septated cyst. It has been previously characterized as a Bosniak 4F lesion and because of slight interval enlargement since 2020 I would recommend a follow-up MRI in 2 years to confer 5 years of stability.   The right kidney is unremarkable.   Stomach/Bowel: Stomach, duodenum, visualized small bowel and visualized colon are grossly normal.   Vascular/Lymphatic: The aorta and branch vessels are normal. The major venous structures are normal. No mesenteric or retroperitoneal mass or adenopathy.   Other:  No ascites or abdominal wall hernia.   Musculoskeletal: No significant bony findings.   IMPRESSION: 1. 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 This is likely a benign  septated cyst. It has been previously characterized as a Bosniak 4F lesion and because of slight interval enlargement since 2020 I would recommend a follow-up MRI in 2 years to confer 5 years of stability. 2. No other significant abdominal findings.     Electronically Signed   By: 2021 M.D.   On: 03/18/2021 10:53  Assessment & Plan:    1. Intermediate risk hematuria -work up x 2 - Bosniak 45f cyst  2. Bosniak 4F left renal cyst -MRI shows stability -repeat in two years  3. BPH with LUTS -PSA pending  -UA benign -continue conservative management, avoiding bladder irritants and timed voiding's  4. Nephrolithiasis - CTU 2022 - bilateral non-obstructive renal stones up to 2 mm in size  Return in about 1 year (around 04/01/2022) for IPSS, SHIM, UA, PSA and exam.  Ivannah Zody, PA-C

## 2021-03-30 NOTE — Progress Notes (Incomplete Revision)
3:35 PM   Tag Wurtz 01-Aug-1970 [id number]  Referring provider: Danelle Berry, PA-C [address] [place],  [place] [address]  Chief Complaint  Patient presents with   Hematuria   Benign Prostatic Hypertrophy   Nephrolithiasis   Urological history 1. BPH with LU TS -PSA pending -cysto 2017 and 2022 noted enlarged prostate, elevated bladder neck and a mass effect of the prostate to the bladder -I PSS 12/1  2. Nephrolithiasis - left ESWL 03/2017 - Stone composition 74% Calcium Oxalate Monohydrate, 23% Calcium Oxalate Dihydrate and 3% Calcium Phosphate Carbonate - 8.2 mm nonobstructive stone in the lower left kidney on 09/2018 RUS  3. Intermediate risk hematuria - Non-smoker - CTU 2017 and 2022 - Bosniak 67F left renal cyst - cysto 03/2015 and 2022 NED - no reports of gross heme -UA 3-10 RBC's   4. Left Renal Bosniak 67F cyst - MRI 2023 - There is a stable septated cyst associated with the midpole lower pole junction region of the left kidney laterally. This measures 21 x 18 mm on image 20/4. In 2020 it measured 19 x 15 mm. No appreciable contrast enhancement is identified. This is likely a benign septated cyst. It has been previously characterized as a Bosniak 67F lesion and because of slight interval enlargement since 2020 I would recommend a follow-up MRI in 2 years to confer 5 years of stability.   HPI: Melvin Hatfield is a 51 y.o. male who presents today for yearly follow up.    No urinary complaints.  Patient denies any modifying or aggravating factors.  Patient denies any gross hematuria, dysuria or suprapubic/flank pain.  Patient denies any fevers, chills, nausea or vomiting.      IPSS     Row Name 03/31/21 1500         International Prostate Symptom Score   How often have you had the sensation of not emptying your bladder? Less than half the time     How often have you had to urinate less than every two hours? About half the time     How often have  you found you stopped and started again several times when you urinated? Less than 1 in 5 times     How often have you found it difficult to postpone urination? Less than 1 in 5 times     How often have you had a weak urinary stream? Less than 1 in 5 times     How often have you had to strain to start urination? Less than 1 in 5 times     How many times did you typically get up at night to urinate? 3 Times     Total IPSS Score 12       Quality of Life due to urinary symptoms   If you were to spend the rest of your life with your urinary condition just the way it is now how would you feel about that? Pleased               Score:  1-7 Mild 8-19 Moderate 20-35 Severe  Patient still having spontaneous erections.  He denies any pain or curvature with erections.     SHIM     Row Name 03/31/21 1521         SHIM: Over the last 6 months:   How do you rate your confidence that you could get and keep an erection? Very High     When you had erections with sexual  stimulation, how often were your erections hard enough for penetration (entering your partner)? Almost Always or Always     During sexual intercourse, how often were you able to maintain your erection after you had penetrated (entered) your partner? Almost Always or Always     During sexual intercourse, how difficult was it to maintain your erection to completion of intercourse? Not Difficult     When you attempted sexual intercourse, how often was it satisfactory for you? 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    Myocardial infarction (HCC) 05/19/2014   Paroxysmal atrial fibrillation with rapid ventricular response (HCC)    Sleep apnea    CPAP   Wears contact lenses     Surgical History: Past Surgical History:  Procedure Laterality Date   CORONARY ANGIOPLASTY WITH STENT PLACEMENT  05/20/2015   EXTRACORPOREAL SHOCK WAVE LITHOTRIPSY Left 04/05/2017   Procedure: EXTRACORPOREAL SHOCK WAVE LITHOTRIPSY (ESWL);  Surgeon: Vanna Scotland, MD;  Location: [facility] ORS;  Service: Urology;  Laterality: Left;   reconstructed ear drum     SPINE SURGERY     C6-C7 fusion.  Approx 2007   TYMPANOSTOMY TUBE PLACEMENT     patient states he has had 6    Home Medications:  Allergies as of 03/31/2021   No Known Allergies      Medication List        Accurate as of March 31, 2021  3:35 PM. If you have any questions, ask your nurse or doctor.          allopurinol 100 MG tablet Commonly known as: ZYLOPRIM Take 1 tablet (100 mg total) by mouth daily.   APPLE CIDER VINEGAR PO Take by mouth daily.   aspirin 81 MG chewable tablet Chew 81 mg by mouth.   ezetimibe 10 MG tablet Commonly known as: Zetia Take 1 tablet (10 mg total) by mouth daily.   MULTIVITAMIN ADULT PO Take by mouth daily.   omeprazole 20 MG capsule Commonly known as: PRILOSEC Take 1 capsule (20 mg total) by mouth daily.   PREVAGEN PO Take by mouth daily.   sertraline 100 MG tablet Commonly known as: ZOLOFT Take 1 tablet (100 mg total) by mouth daily.   Tart Cherry Advanced Caps Take by mouth daily.   ticagrelor 90 MG Tabs tablet Commonly known as: Brilinta Take 1 tablet (90 mg total) by mouth 2 (two) times daily.   TURMERIC PO Take by mouth daily.        Allergies: No Known Allergies  Family History: Family History  Problem Relation Age of Onset   Cancer Mother    Depression Mother    Stroke  Maternal Grandmother    Stroke Maternal Grandfather    Kidney disease Neg Hx    Prostate cancer Neg Hx     Social History:  reports that  he has never smoked. He has never used smokeless tobacco. He reports current alcohol use. He reports that he does not use drugs.    ROS Pertinent ROS in HPI  Physical Exam: Vitals:   03/31/21 1456  BP: (!) 161/78  Pulse: (!) 48    Constitutional:  Well nourished. Alert and oriented, No acute distress. HEENT:  AT, mask in place trachea midline Cardiovascular: No clubbing, cyanosis, or edema. Respiratory: Normal respiratory effort, no increased work of breathing. Neurologic: Grossly intact, no focal deficits, moving all 4 extremities. Psychiatric: Normal mood and affect.   Laboratory Data: Component Ref Range & Units 4 mo ago  Hgb A1c MFr Bld <5.7 % of total Hgb 5.7 High    Comment: For someone without known diabetes, a hemoglobin  A1c value between 5.7% and 6.4% is consistent with  prediabetes and should be confirmed with a  follow-up test.  .  For someone with known diabetes, a value <7%  indicates that their diabetes is well controlled. A1c  targets should be individualized based on duration of  diabetes, age, comorbid conditions, and other  considerations.  .  This assay result is consistent with an increased risk  of diabetes.  .  Currently, no consensus exists regarding use of  hemoglobin A1c for diagnosis of diabetes for children.  .   Mean Plasma Glucose mg/dL 735   eAG (mmol/L) mmol/L 6.5   Resulting Agency  [facility] [place]         Specimen Collected: 11/03/20 09:50 Last Resulted: 11/05/20 04:29         Component Ref Range & Units 4 mo ago  TSH 0.40 - 4.50 mIU/L 2.93   Resulting Agency  [facility] [place]         Specimen Collected: 11/03/20 09:50 Last Resulted: 11/05/20 04:29         Uric Acid, Serum 4.0 - 8.0 mg/dL 8.3 High    Comment: Therapeutic target for gout patients: <6.0  mg/dL  .   Resulting Agency  [facility] [place]         Specimen Collected: 11/03/20 09:50 Last Resulted: 11/05/20 04:29       Cholesterol <200 mg/dL 670  141 High    HDL > OR = 40 mg/dL 62  45 R   Triglycerides <150 mg/dL 030 High   131 High    LDL Cholesterol (Calc) mg/dL (calc) 66    Comment: Reference range: <100  .  Desirable range <100 mg/dL for primary prevention;    <70 mg/dL for patients with CHD or diabetic patients  with > or = 2 CHD risk factors.  Marland Kitchen  LDL-C is now calculated using the Martin-Hopkins  calculation, which is a validated novel method providing  better accuracy than the Friedewald equation in the  estimation of LDL-C.  Horald Pollen et al. Lenox Ahr. 4388;875(79): 2061-2068  (http://education.[facility].com/faq/FAQ164)   Total CHOL/HDL Ratio <5.0 (calc) 2.5  4.8 R   Non-HDL Cholesterol (Calc) <130 mg/dL (calc) 90    Comment: For patients with diabetes plus 1 major ASCVD risk  factor, treating to a non-HDL-C goal of <100 mg/dL  (LDL-C of <72 mg/dL) is considered a therapeutic  option.   Resulting Agency  [facility] [place]         Specimen Collected: 11/03/20 09:50 Last Resulted: 11/05/20 04:29       CMP Latest Ref Rng & Units 11/03/2020 08/17/2016 03/02/2016  Glucose 65 - 99 mg/dL 820(U) 015(I) 98  BUN 7 -  25 mg/dL 18 15 13   Creatinine 0.60 - 1.29 mg/dL 0.62 3.76  Sodium 135 - 146 mmol/L 141 141 142  Potassium 3.5 - 5.3 mmol/L 4.3 5.0 4.5  Chloride 98 - 110 mmol/L 104 104 101  CO2 20 - 32 mmol/L 30 29 23   Calcium 8.6 - 10.3 mg/dL 9.7 9.7 9.4  Total Protein 6.1 - 8.1 g/dL 6.6 7.0 -  Total Bilirubin 0.2 - 1.2 mg/dL 1.1 0.8 -  Alkaline Phos 40 - 115 U/L - 70 -  AST 10 - 40 U/L 22 22 -  ALT 9 - 46 U/L 22 23 -    CBC Latest Ref Rng & Units 11/03/2020 08/17/2016  WBC 3.8 - 10.8 Thousand/uL 5.9 -  Hemoglobin 13.2 - 17.1 g/dL 01/03/2021 08/19/2016  Hematocrit 15.1 - 50.0 % 42.8 -  Platelets 140 - 400 Thousand/uL 248 -    Urinalysis *** I have reviewed the labs.  Pertinent Imaging CLINICAL DATA:  Follow-up complex renal cyst.   EXAM: MRI ABDOMEN WITHOUT AND WITH CONTRAST   TECHNIQUE: Multiplanar multisequence MR imaging of the abdomen was performed both before and after the administration of intravenous contrast.   CONTRAST:  46mL GADAVIST GADOBUTROL 1 MMOL/ML IV SOLN   COMPARISON:  Multiple previous imaging studies dating back to 2017. The most recent MR examination is 07/07/2019.   FINDINGS: Lower chest: The lung bases are clear of an acute process. No pulmonary lesions, pleural or pericardial effusion.   Hepatobiliary: No hepatic lesions or intrahepatic biliary dilatation. The gallbladder is unremarkable. Normal caliber and course of the common bile duct.   Pancreas:  No mass, inflammation or ductal dilatation.   Spleen:  Normal size. No focal lesions.   Adrenals/Urinary Tract:  The adrenal glands are normal.   There is a stable septated cyst associated with the midpole lower pole junction region of the left kidney laterally. This measures 21 x 18 mm on image 20/4. In 2020 it measured 19 x 15 mm. No appreciable contrast enhancement is identified. This is likely a benign septated cyst. It has been previously characterized as a Bosniak 73F lesion and because of slight interval enlargement since 2020 I would recommend a follow-up MRI in 2 years to confer 5 years of stability.   The right kidney is unremarkable.   Stomach/Bowel: Stomach, duodenum, visualized small bowel and visualized colon are grossly normal.   Vascular/Lymphatic: The aorta and branch vessels are normal. The major venous structures are normal. No mesenteric or retroperitoneal mass or adenopathy.   Other:  No ascites or abdominal wall hernia.   Musculoskeletal: No significant bony findings.   IMPRESSION: 1. Stable septated cyst associated with the midpole lower pole junction region of the left kidney  laterally. This is likely a benign septated cyst. It has been previously characterized as a Bosniak 73F lesion and because of slight interval enlargement since 2020 I would recommend a follow-up MRI in 2 years to confer 5 years of stability. 2. No other significant abdominal findings.     Electronically Signed   By: 2021 M.D.   On: 03/18/2021 10:53  Assessment & Plan:    1. Intermediate risk hematuria -work up x 2 - Bosniak 55f cyst  2. Bosniak 73F left renal cyst -MRI shows stability -repeat in two years  3. BPH with LUTS -PSA pending  -UA benign -continue conservative management, avoiding bladder irritants and timed voiding's  4. Nephrolithiasis - CTU 2022 - bilateral non-obstructive renal stones up to 2 mm  in size  Return in about 1 year (around 04/01/2022) for IPSS, SHIM, UA, PSA and exam.  Mayline Dragon, PA-C

## 2021-03-31 ENCOUNTER — Ambulatory Visit: Payer: BC Managed Care – PPO | Admitting: Urology

## 2021-03-31 ENCOUNTER — Encounter: Payer: Self-pay | Admitting: Urology

## 2021-03-31 ENCOUNTER — Other Ambulatory Visit: Payer: Self-pay

## 2021-03-31 VITALS — BP 161/78 | HR 48 | Ht 70.0 in | Wt 237.0 lb

## 2021-03-31 DIAGNOSIS — N401 Enlarged prostate with lower urinary tract symptoms: Secondary | ICD-10-CM | POA: Diagnosis not present

## 2021-03-31 DIAGNOSIS — R319 Hematuria, unspecified: Secondary | ICD-10-CM

## 2021-03-31 DIAGNOSIS — N281 Cyst of kidney, acquired: Secondary | ICD-10-CM | POA: Diagnosis not present

## 2021-03-31 DIAGNOSIS — N138 Other obstructive and reflux uropathy: Secondary | ICD-10-CM

## 2021-03-31 DIAGNOSIS — N200 Calculus of kidney: Secondary | ICD-10-CM

## 2021-04-01 LAB — URINALYSIS, COMPLETE
Bilirubin, UA: NEGATIVE
Glucose, UA: NEGATIVE
Ketones, UA: NEGATIVE
Leukocytes,UA: NEGATIVE
Nitrite, UA: NEGATIVE
Protein,UA: NEGATIVE
Specific Gravity, UA: 1.02 (ref 1.005–1.030)
Urobilinogen, Ur: 0.2 mg/dL (ref 0.2–1.0)
pH, UA: 7.5 (ref 5.0–7.5)

## 2021-04-01 LAB — MICROSCOPIC EXAMINATION: Bacteria, UA: NONE SEEN

## 2021-04-01 LAB — PSA: Prostate Specific Ag, Serum: 1.2 ng/mL (ref 0.0–4.0)

## 2021-04-29 ENCOUNTER — Other Ambulatory Visit: Payer: Self-pay | Admitting: Nurse Practitioner

## 2021-04-29 DIAGNOSIS — M1A09X0 Idiopathic chronic gout, multiple sites, without tophus (tophi): Secondary | ICD-10-CM

## 2021-04-29 NOTE — Telephone Encounter (Signed)
Requested Prescriptions  ?Pending Prescriptions Disp Refills  ?? allopurinol (ZYLOPRIM) 100 MG tablet [Pharmacy Med Name: Allopurinol 100 MG Oral Tablet] 90 tablet 0  ?  Sig: Take 1 tablet by mouth once daily  ?  ? Endocrinology:  Gout Agents - allopurinol Failed - 04/29/2021  9:26 AM  ?  ?  Failed - Uric Acid in normal range and within 360 days  ?  Uric Acid, Serum  ?Date Value Ref Range Status  ?11/03/2020 8.3 (H) 4.0 - 8.0 mg/dL Final  ?  Comment:  ?  Therapeutic target for gout patients: <6.0 mg/dL ?. ?  ?   ?  ?  Passed - Cr in normal range and within 360 days  ?  Creat  ?Date Value Ref Range Status  ?11/03/2020 1.16 0.60 - 1.29 mg/dL Final  ?   ?  ?  Passed - Valid encounter within last 12 months  ?  Recent Outpatient Visits   ?      ? 5 months ago Major depressive disorder, recurrent episode, in full remission (HCC)  ? [facility] Della Goo F, FNP  ? 1 year ago Insomnia, unspecified type  ? [facility] Danelle Berry, PA-C  ? 2 years ago Hyperlipidemia LDL goal <70  ? [facility] Danelle Berry, PA-C  ? 2 years ago PAF (paroxysmal atrial fibrillation) (HCC)  ? [facility] Danelle Berry, PA-C  ? 3 years ago Left foot pain  ? [facility] [place], Percell Belt, NP  ?  ?  ?Future Appointments   ?        ? In 11 months McGowan, Elana Alm [facility]  ?  ? ?  ?  ?  Passed - CBC within normal limits and completed in the last 12 months  ?  WBC  ?Date Value Ref Range Status  ?11/03/2020 5.9 3.8 - 10.8 Thousand/uL Final  ? ?RBC  ?Date Value Ref Range Status  ?11/03/2020 4.76 4.20 - 5.80 Million/uL Final  ? ?Hemoglobin  ?Date Value Ref Range Status  ?11/03/2020 14.7 13.2 - 17.1 g/dL Final  ? ?HCT  ?Date Value Ref Range Status  ?11/03/2020 42.8 38.5 - 50.0 % Final  ? ?MCHC  ?Date Value Ref Range Status  ?11/03/2020 34.3 32.0 - 36.0 g/dL Final  ? ?MCH  ?Date Value Ref Range Status  ?11/03/2020 30.9  27.0 - 33.0 pg Final  ? ?MCV  ?Date Value Ref Range Status  ?11/03/2020 89.9 80.0 - 100.0 fL Final  ? ?No results found for: PLTCOUNTKUC, LABPLAT, POCPLA ?RDW  ?Date Value Ref Range Status  ?11/03/2020 13.8 11.0 - 15.0 % Final  ? ?  ?  ?  ? ? ?

## 2021-10-19 ENCOUNTER — Other Ambulatory Visit: Payer: Self-pay | Admitting: Nurse Practitioner

## 2021-10-19 DIAGNOSIS — E785 Hyperlipidemia, unspecified: Secondary | ICD-10-CM

## 2021-10-19 NOTE — Telephone Encounter (Signed)
Requested Prescriptions  Pending Prescriptions Disp Refills  . ezetimibe (ZETIA) 10 MG tablet [Pharmacy Med Name: Ezetimibe 10 MG Oral Tablet] 90 tablet 0    Sig: Take 1 tablet by mouth once daily     Cardiovascular:  Antilipid - Sterol Transport Inhibitors Failed - 10/19/2021  1:26 PM      Failed - Lipid Panel in normal range within the last 12 months    Cholesterol  Date Value Ref Range Status  11/03/2020 152 <200 mg/dL Final   LDL Cholesterol (Calc)  Date Value Ref Range Status  11/03/2020 66 mg/dL (calc) Final    Comment:    Reference range: <100 . Desirable range <100 mg/dL for primary prevention;   <70 mg/dL for patients with CHD or diabetic patients  with > or = 2 CHD risk factors. Marland Kitchen LDL-C is now calculated using the Martin-Hopkins  calculation, which is a validated novel method providing  better accuracy than the Friedewald equation in the  estimation of LDL-C.  Cresenciano Genre et al. Annamaria Helling. 7494;496(75): 2061-2068  (http://education.[facility].com/faq/FAQ164)    HDL  Date Value Ref Range Status  11/03/2020 62 > OR = 40 mg/dL Final   Triglycerides  Date Value Ref Range Status  11/03/2020 162 (H) <150 mg/dL Final         Passed - AST in normal range and within 360 days    AST  Date Value Ref Range Status  11/03/2020 22 10 - 40 U/L Final         Passed - ALT in normal range and within 360 days    ALT  Date Value Ref Range Status  11/03/2020 22 9 - 46 U/L Final         Passed - Patient is not pregnant      Passed - Valid encounter within last 12 months    Recent Outpatient Visits          11 months ago Major depressive disorder, recurrent episode, in full remission [facility])   [place], FNP   2 years ago Insomnia, unspecified type   [facility] Delsa Grana, PA-C   2 years ago Hyperlipidemia LDL goal <70   [facility] Delsa Grana, PA-C   2 years ago PAF (paroxysmal atrial  fibrillation) [facility])   [facility] Delsa Grana, PA-C   3 years ago Left foot pain   [place], Bethel Born, NP      Future Appointments            In 5 months McGowan, Shannon A, [place]

## 2021-11-27 ENCOUNTER — Other Ambulatory Visit: Payer: Self-pay | Admitting: Nurse Practitioner

## 2021-11-27 DIAGNOSIS — F3342 Major depressive disorder, recurrent, in full remission: Secondary | ICD-10-CM

## 2021-11-28 NOTE — Telephone Encounter (Signed)
Requested medications are due for refill today.  yes  Requested medications are on the active medications list.  yes  Last refill. 11/03/2020 #90 3 rf  Future visit scheduled.   no  Notes to clinic.  Pt is more than 3 months over due for OV. Labs are expired.    Requested Prescriptions  Pending Prescriptions Disp Refills   sertraline (ZOLOFT) 100 MG tablet [Pharmacy Med Name: Sertraline HCl 100 MG Oral Tablet] 90 tablet 0    Sig: Take 1 tablet by mouth once daily     Psychiatry:  Antidepressants - SSRI - sertraline Failed - 11/27/2021  1:11 PM      Failed - AST in normal range and within 360 days    AST  Date Value Ref Range Status  11/03/2020 22 10 - 40 U/L Final         Failed - ALT in normal range and within 360 days    ALT  Date Value Ref Range Status  11/03/2020 22 9 - 46 U/L Final         Failed - Completed PHQ-2 or PHQ-9 in the last 360 days      Failed - Valid encounter within last 6 months    Recent Outpatient Visits           1 year ago Major depressive disorder, recurrent episode, in full remission [facility])   [place], FNP   2 years ago Insomnia, unspecified type   [facility] Delsa Grana, PA-C   2 years ago Hyperlipidemia LDL goal <70   [facility] Delsa Grana, PA-C   3 years ago PAF (paroxysmal atrial fibrillation) [facility])   [facility] Delsa Grana, PA-C   3 years ago Left foot pain   [place], NP       Future Appointments             In 4 months McGowan, Shannon A, [place]

## 2021-12-05 ENCOUNTER — Encounter: Payer: Self-pay | Admitting: Nurse Practitioner

## 2021-12-05 ENCOUNTER — Ambulatory Visit: Payer: BC Managed Care – PPO | Admitting: Nurse Practitioner

## 2021-12-05 ENCOUNTER — Other Ambulatory Visit: Payer: Self-pay

## 2021-12-05 VITALS — BP 120/84 | HR 62 | Temp 98.0°F | Resp 18 | Ht 70.0 in | Wt 238.9 lb

## 2021-12-05 DIAGNOSIS — I252 Old myocardial infarction: Secondary | ICD-10-CM

## 2021-12-05 DIAGNOSIS — G4700 Insomnia, unspecified: Secondary | ICD-10-CM | POA: Diagnosis not present

## 2021-12-05 DIAGNOSIS — M791 Myalgia, unspecified site: Secondary | ICD-10-CM

## 2021-12-05 DIAGNOSIS — I2510 Atherosclerotic heart disease of native coronary artery without angina pectoris: Secondary | ICD-10-CM

## 2021-12-05 DIAGNOSIS — E785 Hyperlipidemia, unspecified: Secondary | ICD-10-CM | POA: Diagnosis not present

## 2021-12-05 DIAGNOSIS — F3342 Major depressive disorder, recurrent, in full remission: Secondary | ICD-10-CM

## 2021-12-05 DIAGNOSIS — M1A09X0 Idiopathic chronic gout, multiple sites, without tophus (tophi): Secondary | ICD-10-CM

## 2021-12-05 DIAGNOSIS — R030 Elevated blood-pressure reading, without diagnosis of hypertension: Secondary | ICD-10-CM

## 2021-12-05 DIAGNOSIS — I480 Paroxysmal atrial fibrillation: Secondary | ICD-10-CM

## 2021-12-05 DIAGNOSIS — G4733 Obstructive sleep apnea (adult) (pediatric): Secondary | ICD-10-CM

## 2021-12-05 MED ORDER — SERTRALINE HCL 100 MG PO TABS: 100.0000 mg | ORAL_TABLET | Freq: Every day | ORAL | 3 refills | Status: DC

## 2021-12-05 NOTE — Progress Notes (Signed)
BP 120/84   Pulse 62   Temp 98 F (36.7 C) (Oral)   Resp 18   Ht 5\' 10"  (1.778 m)   Wt 238 lb 14.4 oz (108.4 kg)   BMI 34.28 kg/m    Subjective:    Patient ID: Melvin Hatfield, male    DOB: Mar 17, 1970, 51 y.o.   MRN: [id number]  HPI: Melvin Hatfield is a 51 y.o. male, here alone  Chief Complaint  Patient presents with   Depression   Anxiety    Medication refills   Hyperlipidemia   Depression/ Insomnia : Patient currently takes Zoloft 100 mg daily.  Patient reports he is doing well on this dose.    12/05/2021    3:25 PM 11/03/2020    9:24 AM 06/24/2019   10:00 AM 04/21/2019    7:36 AM 10/25/2018    3:13 PM  Depression screen PHQ 2/9  Decreased Interest 0 0 0 1 0  Down, Depressed, Hopeless 0 0 0 1 0  PHQ - 2 Score 0 0 0 2 0  Altered sleeping 0 0 0 3 0  Tired, decreased energy 0 0 0 3 0  Change in appetite 0 0 0 0 0  Feeling bad or failure about yourself  0 0 0 0 0  Trouble concentrating 0 0 0 1 0  Moving slowly or fidgety/restless 0 0 0 0 0  Suicidal thoughts 0 0 0 0 0  PHQ-9 Score 0 0 0 9 0  Difficult doing work/chores  Not difficult at all Not difficult at all Somewhat difficult Not difficult at all       12/05/2021    3:27 PM 11/03/2020    9:24 AM  GAD 7 : Generalized Anxiety Score  Nervous, Anxious, on Edge 0 0  Control/stop worrying 0 0  Worry too much - different things 0 0  Trouble relaxing 0 0  Restless 0 0  Easily annoyed or irritable 0 0  Afraid - awful might happen 0 0  Total GAD 7 Score 0 0  Anxiety Difficulty Not difficult at all Not difficult at all      Hyperlipidemia: His LDL was 66 on 11/03/2020.  He was on rosuvastatin 5 mg daily but was complaining myalgia.  Checked a CK and it was elevated.  Instructed patient to stop rosuvastatin and start taking Zetia 10 mg daily.  Cardiology recheck his cholesterol on 11/02/2021 his LDL had increased to 104.  Cardiology recommend patient restart rosuvastatin due to patient still complaining of pain even when  not on rosuvastatin. Patient has restarted taking rosuvastatin 5 mg daily.   History of MI/ PAF: Last saw cardiology on 11/02/2021.  Patient is currently on Brilinta and aspirin.  Patient has had no bouts of atrial fibrillation. He denies any chest pain or shortness of breath.   OSA/CPAP: Patient reports he has not been as good about wearing his CPAP machine when he goes to bed.  Patient states he did recently restart running in the last few weeks.  Blood pressure concern: His blood pressure at cardiology office on 11/02/2021 was 197/84, recheck was 140/84.  Today his blood pressure is 120/84.   Myalgia : He says he has had muscle cramps and pain for years.  Patient was taking rosuvastatin 5 mg daily we checked a CK level which was elevated.  We had patient's stop rosuvastatin and start Zetia 10 mg daily.  Patient went back to cardiology on 11/02/2021 and had his LDL rechecked which was increased  to 104.  Cardiology recommended patient restart rosuvastatin due to patient continuing to complain of pain.  Gout/Joint pain: He says he has had joint pain for about 6 months. He says it is pretty much all his joints including; bilateral shoulders, elbows, knees, ankles, hips left is worse.  Last office visit did labs which showed a uric acid of 8.3.  Started patient on allopurinol 100 mg daily. He says he stopped taking it because he did not notice a difference. He says he is going to work on his diet.        12/05/2021    3:25 PM 11/03/2020    9:24 AM 06/24/2019   10:00 AM 04/21/2019    7:36 AM 10/25/2018    3:13 PM  Depression screen PHQ 2/9  Decreased Interest 0 0 0 1 0  Down, Depressed, Hopeless 0 0 0 1 0  PHQ - 2 Score 0 0 0 2 0  Altered sleeping 0 0 0 3 0  Tired, decreased energy 0 0 0 3 0  Change in appetite 0 0 0 0 0  Feeling bad or failure about yourself  0 0 0 0 0  Trouble concentrating 0 0 0 1 0  Moving slowly or fidgety/restless 0 0 0 0 0  Suicidal thoughts 0 0 0 0 0  PHQ-9 Score 0 0  0 9 0  Difficult doing work/chores  Not difficult at all Not difficult at all Somewhat difficult Not difficult at all    Relevant past medical, surgical, family and social history reviewed and updated as indicated. Interim medical history since our last visit reviewed. Allergies and medications reviewed and updated.  Review of Systems  Constitutional: Negative for fever or weight change.  Respiratory: Negative for cough and shortness of breath.   Cardiovascular: Negative for chest pain or palpitations.  Gastrointestinal: Negative for abdominal pain, no bowel changes.  Musculoskeletal: Negative for gait problem or joint swelling. Positive for joint and muscle pain Skin: Negative for rash.  Neurological: Negative for dizziness or headache.  No other specific complaints in a complete review of systems (except as listed in HPI above).      Objective:    BP 120/84   Pulse 62   Temp 98 F (36.7 C) (Oral)   Resp 18   Ht 5\' 10"  (1.778 m)   Wt 238 lb 14.4 oz (108.4 kg)   BMI 34.28 kg/m   Wt Readings from Last 3 Encounters:  12/05/21 238 lb 14.4 oz (108.4 kg)  03/31/21 237 lb (107.5 kg)  11/03/20 240 lb 14.4 oz (109.3 kg)    Physical Exam  Constitutional: Patient appears well-developed and well-nourished. No distress.  HEENT: head atraumatic, normocephalic, pupils equal and reactive to light, neck supple, throat within normal limits Cardiovascular: Normal rate, regular rhythm and normal heart sounds.  No murmur heard. No BLE edema. Pulmonary/Chest: Effort normal and breath sounds normal. No respiratory distress. Abdominal: Soft.  There is no tenderness. Psychiatric: Patient has a normal mood and affect. behavior is normal. Judgment and thought content normal.   Results for orders placed or performed in visit on 03/31/21  Microscopic Examination   Urine  Result Value Ref Range   WBC, UA 0-5 0 - 5 /hpf   RBC, Urine 3-10 (A) 0 - 2 /hpf   Epithelial Cells (non renal) 0-10 0 - 10  /hpf   Crystals Present (A) N/A   Crystal Type Amorphous Sediment N/A   Bacteria, UA None seen None seen/Few  Urinalysis, Complete  Result Value Ref Range   Specific Gravity, UA 1.020 1.005 - 1.030   pH, UA 7.5 5.0 - 7.5   Color, UA Yellow Yellow   Appearance Ur Clear Clear   Leukocytes,UA Negative Negative   Protein,UA Negative Negative/Trace   Glucose, UA Negative Negative   Ketones, UA Negative Negative   RBC, UA Trace (A) Negative   Bilirubin, UA Negative Negative   Urobilinogen, Ur 0.2 0.2 - 1.0 mg/dL   Nitrite, UA Negative Negative   Microscopic Examination See below:   PSA  Result Value Ref Range   Prostate Specific Ag, Serum 1.2 0.0 - 4.0 ng/mL      Assessment & Plan:   Problem List Items Addressed This Visit       Cardiovascular and Mediastinum   Coronary artery disease involving native coronary artery without angina pectoris (Chronic)    Continue taking Brilinta and aspirin per cardiology recommendations.      Relevant Medications   rosuvastatin (CRESTOR) 5 MG tablet   PAF (paroxysmal atrial fibrillation) (HCC)    Continue taking Brilinta and aspirin per cardiology recommendations.      Relevant Medications   rosuvastatin (CRESTOR) 5 MG tablet     Respiratory   OSA on CPAP    Continue using CPAP machine when going to sleep.        Musculoskeletal and Integument   Chronic gout of multiple sites    Patient's last uric acid was 8.3.  Patient was started on allopurinol 100 mg daily.  Patient stopped taking it because did not notice a difference.  Patient reports he has been work on his diet.      Relevant Orders   Uric acid     Other   Hyperlipidemia LDL goal <70 (Chronic)    Continue taking rosuvastatin 5 mg daily.      Relevant Medications   rosuvastatin (CRESTOR) 5 MG tablet   Other Relevant Orders   COMPLETE METABOLIC PANEL WITH GFR   History of myocardial infarct at age less than 60 years    Continue taking Brilinta and aspirin per  cardiology recommendations.      Insomnia   Major depressive disorder, recurrent episode, in full remission (HCC) - Primary   Relevant Medications   sertraline (ZOLOFT) 100 MG tablet   Myalgia    Last appointment we stopped patient's rosuvastatin and started Zetia 10 mg daily.  However patient's LDL increased.  Cardiology wanted patient to restart rosuvastatin.  Patient is currently taking rosuvastatin 5 mg daily.  Patient states he is doing well.      Borderline blood pressure   Relevant Orders   CBC with Differential/Platelet   COMPLETE METABOLIC PANEL WITH GFR    Follow up plan: Return in about 6 months (around 06/05/2022) for follow up, Leisa PA.

## 2021-12-05 NOTE — Assessment & Plan Note (Signed)
Continue taking Brilinta and aspirin per cardiology recommendations. 

## 2021-12-05 NOTE — Assessment & Plan Note (Signed)
Continue taking Brilinta and aspirin per cardiology recommendations.

## 2021-12-05 NOTE — Assessment & Plan Note (Signed)
Patient's last uric acid was 8.3.  Patient was started on allopurinol 100 mg daily.  Patient stopped taking it because did not notice a difference.  Patient reports he has been work on his diet.

## 2021-12-05 NOTE — Assessment & Plan Note (Signed)
Last appointment we stopped patient's rosuvastatin and started Zetia 10 mg daily.  However patient's LDL increased.  Cardiology wanted patient to restart rosuvastatin.  Patient is currently taking rosuvastatin 5 mg daily.  Patient states he is doing well.

## 2021-12-05 NOTE — Assessment & Plan Note (Signed)
Continue using CPAP machine when going to sleep.

## 2021-12-05 NOTE — Assessment & Plan Note (Signed)
Continue  taking rosuvastatin 5 mg daily. 

## 2021-12-06 LAB — COMPLETE METABOLIC PANEL WITH GFR
AG Ratio: 2.1 (calc) (ref 1.0–2.5)
ALT: 30 U/L (ref 9–46)
AST: 23 U/L (ref 10–35)
Albumin: 4.6 g/dL (ref 3.6–5.1)
Alkaline phosphatase (APISO): 65 U/L (ref 35–144)
BUN: 17 mg/dL (ref 7–25)
CO2: 28 mmol/L (ref 20–32)
Calcium: 9.3 mg/dL (ref 8.6–10.3)
Chloride: 106 mmol/L (ref 98–110)
Creat: 1.01 mg/dL (ref 0.70–1.30)
Globulin: 2.2 g/dL (calc) (ref 1.9–3.7)
Glucose, Bld: 95 mg/dL (ref 65–99)
Potassium: 4.2 mmol/L (ref 3.5–5.3)
Sodium: 142 mmol/L (ref 135–146)
Total Bilirubin: 0.7 mg/dL (ref 0.2–1.2)
Total Protein: 6.8 g/dL (ref 6.1–8.1)
eGFR: 90 mL/min/{1.73_m2} (ref >=60)

## 2021-12-06 LAB — CBC WITH DIFFERENTIAL/PLATELET
Absolute Monocytes: 636 cells/uL (ref 200–950)
Basophils Absolute: 72 cells/uL (ref 0–200)
Basophils Relative: 1.2 %
Eosinophils Absolute: 222 cells/uL (ref 15–500)
Eosinophils Relative: 3.7 %
HCT: 42.9 % (ref 38.5–50.0)
Hemoglobin: 14.8 g/dL (ref 13.2–17.1)
Lymphs Abs: 1488 cells/uL (ref 850–3900)
MCH: 30.6 pg (ref 27.0–33.0)
MCHC: 34.5 g/dL (ref 32.0–36.0)
MCV: 88.6 fL (ref 80.0–100.0)
MPV: 10.2 fL (ref 7.5–12.5)
Monocytes Relative: 10.6 %
Neutro Abs: 3582 cells/uL (ref 1500–7800)
Neutrophils Relative %: 59.7 %
Platelets: 261 10*3/uL (ref 140–400)
RBC: 4.84 10*6/uL (ref 4.20–5.80)
RDW: 13.3 % (ref 11.0–15.0)
Total Lymphocyte: 24.8 %
WBC: 6 10*3/uL (ref 3.8–10.8)

## 2021-12-06 LAB — URIC ACID: Uric Acid, Serum: 7.2 mg/dL (ref 4.0–8.0)

## 2022-01-22 ENCOUNTER — Other Ambulatory Visit: Payer: Self-pay | Admitting: Family Medicine

## 2022-01-22 DIAGNOSIS — E785 Hyperlipidemia, unspecified: Secondary | ICD-10-CM

## 2022-03-30 NOTE — Progress Notes (Signed)
10:24 AM   Melvin Hatfield 03-15-1970 [id number]  Referring provider: Delsa Grana, PA-C [address] [place],  [place] [address]  Chief Complaint  Patient presents with   Follow-up   Urological history 1. BPH with LU TS -PSA pending -cysto 2017 and 2022 noted enlarged prostate, elevated bladder neck and a mass effect of the prostate to the bladder -I PSS 10/2  2. Nephrolithiasis - left ESWL 03/2017 - Stone composition 74% Calcium Oxalate Monohydrate, 23% Calcium Oxalate Dihydrate and 3% Calcium Phosphate Carbonate - 8.2 mm nonobstructive stone in the lower left kidney on 09/2018 RUS  3. High risk hematuria - Non-smoker - CTU 2017 and 2022 - Bosniak 28F left renal cyst - cysto 03/2015 and 2022 NED - no reports of gross heme -UA 11-30 RBC's  4. Left Renal Bosniak 28F cyst - MRI 2023 - There is a stable septated cyst associated with the midpole lower pole junction region of the left kidney laterally. This measures 21 x 18 mm on image 20/4. In 2020 it measured 19 x 15 mm. No appreciable contrast enhancement is identified. This is likely a benign septated cyst. It has been previously characterized as a Bosniak 28F lesion and because of slight interval enlargement since 2020 I would recommend a follow-up MRI in 2 years to confer 5 years of stability.   HPI: Melvin Hatfield is a 52 y.o. male who presents today for yearly follow up.    He passed a stone last Sunday night and some brown urine.    He has no issues with his other urinary issues.  Patient denies any modifying or aggravating factors.  Patient denies any gross hematuria, dysuria or suprapubic/flank pain.  Patient denies any fevers, chills, nausea or vomiting.    UA all clear specific gravity 1.020, 1+ blood, pH 6.5, 0-5 WBC's, 11-30 RBC's and 0-10 epithelial cells.    IPSS     Row Name 03/31/22 1000         International Prostate Symptom Score   How often have you had the sensation of not emptying your  bladder? Less than half the time  Simultaneous filing. User may not have seen previous data.     How often have you had to urinate less than every two hours? About half the time  Simultaneous filing. User may not have seen previous data.     How often have you found you stopped and started again several times when you urinated? Less than 1 in 5 times  Simultaneous filing. User may not have seen previous data.     How often have you found it difficult to postpone urination? Less than 1 in 5 times  Simultaneous filing. User may not have seen previous data.     How often have you had a weak urinary stream? Less than half the time  Simultaneous filing. User may not have seen previous data.     How often have you had to strain to start urination? Not at All  Simultaneous filing. User may not have seen previous data.     How many times did you typically get up at night to urinate? 1 Time  Simultaneous filing. User may not have seen previous data.     Total IPSS Score 10  Simultaneous filing. User may not have seen previous data.       Quality of Life due to urinary symptoms   If you were to spend the rest of your life with your urinary  condition just the way it is now how would you feel about that? Mostly Satisfied  Simultaneous filing. User may not have seen previous data.                Score:  1-7 Mild 8-19 Moderate 20-35 Severe      PMH: Past Medical History:  Diagnosis Date   Allergy    Arthritis    Coronary artery disease involving native coronary artery without angina pectoris    Depression    GERD (gastroesophageal reflux disease)    Hyperlipidemia    Left nephrolithiasis 08/17/2014   Major depressive disorder, recurrent episode, in partial remission ([facility]) 07/09/2014   Doing well on current dosage of Sertraline 100mg .    Myocardial infarction ([facility]) 05/19/2014   Paroxysmal atrial fibrillation with rapid ventricular response (HCC)    Sleep apnea    CPAP   Wears contact  lenses     Surgical History: Past Surgical History:  Procedure Laterality Date   CORONARY ANGIOPLASTY WITH STENT PLACEMENT  05/20/2015   EXTRACORPOREAL SHOCK WAVE LITHOTRIPSY Left 04/05/2017   Procedure: EXTRACORPOREAL SHOCK WAVE LITHOTRIPSY (ESWL);  Surgeon: Hollice Espy, MD;  Location: [facility] ORS;  Service: Urology;  Laterality: Left;   reconstructed ear drum     SPINE SURGERY     C6-C7 fusion.  Approx 2007   TYMPANOSTOMY TUBE PLACEMENT     patient states he has had 6    Home Medications:  Allergies as of 03/31/2022   No Known Allergies      Medication List        Accurate as of March 31, 2022 10:24 AM. If you have any questions, ask your nurse or doctor.          STOP taking these medications    allopurinol 100 MG tablet Commonly known as: ZYLOPRIM   ezetimibe 10 MG tablet Commonly known as: ZETIA   PREVAGEN PO   TURMERIC PO       TAKE these medications    APPLE CIDER VINEGAR PO Take by mouth daily.   aspirin 81 MG chewable tablet Chew 81 mg by mouth.   MULTIVITAMIN ADULT PO Take by mouth daily.   omeprazole 20 MG capsule Commonly known as: PRILOSEC Take 1 capsule (20 mg total) by mouth daily.   rosuvastatin 5 MG tablet Commonly known as: CRESTOR Take 1 tablet by mouth daily.   sertraline 100 MG tablet Commonly known as: ZOLOFT Take 1 tablet (100 mg total) by mouth daily.   Tart Cherry Advanced Caps Take by mouth daily.   ticagrelor 90 MG Tabs tablet Commonly known as: Brilinta Take 1 tablet (90 mg total) by mouth 2 (two) times daily.        Allergies: No Known Allergies  Family History: Family History  Problem Relation Age of Onset   Cancer Mother    Depression Mother    Stroke Maternal Grandmother    Stroke Maternal Grandfather    Kidney disease Neg Hx    Prostate cancer Neg Hx     Social History:  reports that he has never smoked. He has never used smokeless tobacco. He reports current alcohol use. He reports that he  does not use drugs.    ROS Pertinent ROS in HPI  Physical Exam: Vitals:   03/31/22 1013  BP: (!) 174/91  Pulse: (!) 48   Constitutional:  Well nourished. Alert and oriented, No acute distress. HEENT: [place] AT, moist mucus membranes.  Trachea midline Cardiovascular: No clubbing, cyanosis, or  edema. Respiratory: Normal respiratory effort, no increased work of breathing. Neurologic: Grossly intact, no focal deficits, moving all 4 extremities. Psychiatric: Normal mood and affect.    Laboratory Data:    Latest Ref Rng & Units 12/05/2021    3:42 PM 11/03/2020    9:50 AM 08/17/2016   11:36 AM  CMP  Glucose 65 - 99 mg/dL 95  113  110   BUN 7 - 25 mg/dL 17  18  15    Creatinine 0.70 - 1.30 mg/dL 1.01  1.16  1.23   Sodium 135 - 146 mmol/L 142  141  141   Potassium 3.5 - 5.3 mmol/L 4.2  4.3  5.0   Chloride 98 - 110 mmol/L 106  104  104   CO2 20 - 32 mmol/L 28  30  29    Calcium 8.6 - 10.3 mg/dL 9.3  9.7  9.7   Total Protein 6.1 - 8.1 g/dL 6.8  6.6  7.0   Total Bilirubin 0.2 - 1.2 mg/dL 0.7  1.1  0.8   Alkaline Phos 40 - 115 U/L   70   AST 10 - 35 U/L 23  22  22    ALT 9 - 46 U/L 30  22  23         Latest Ref Rng & Units 12/05/2021    3:42 PM 11/03/2020    9:50 AM 08/17/2016   11:36 AM  CBC  WBC 3.8 - 10.8 Thousand/uL 6.0  5.9    Hemoglobin 13.2 - 17.1 g/dL 14.8  14.7  15.5   Hematocrit 38.5 - 50.0 % 42.9  42.8    Platelets 140 - 400 Thousand/uL 261  248     Urinalysis See EPIC and HPI I have reviewed the labs.  Pertinent Imaging N/A  Assessment & Plan:    1. High risk hematuria -work up x 2 - Bosniak 65f cyst -No reports of gross heme -UA 11-30 RBC's -likely from the passage of stone -Will recheck UA in 2 months, advised him of the importance of follow-up to ensure micro heme resolves  2. Bosniak 59F left renal cyst -MRI shows stability -repeat in two years (02/2023)   3. BPH with LUTS -PSA pending  -UA benign -continue conservative management, avoiding bladder  irritants and timed voiding's  4. Nephrolithiasis - CTU 2022 - bilateral non-obstructive renal stones up to 2 mm in size  Return in about 2 months (around 05/31/2022) for UA only .  Sair Faulcon, PA-C

## 2022-03-31 ENCOUNTER — Ambulatory Visit: Payer: BC Managed Care – PPO | Admitting: Urology

## 2022-03-31 ENCOUNTER — Encounter: Payer: Self-pay | Admitting: Urology

## 2022-03-31 VITALS — BP 174/91 | HR 48 | Ht 70.0 in | Wt 238.0 lb

## 2022-03-31 DIAGNOSIS — N200 Calculus of kidney: Secondary | ICD-10-CM | POA: Diagnosis not present

## 2022-03-31 DIAGNOSIS — N401 Enlarged prostate with lower urinary tract symptoms: Secondary | ICD-10-CM | POA: Diagnosis not present

## 2022-03-31 DIAGNOSIS — N281 Cyst of kidney, acquired: Secondary | ICD-10-CM

## 2022-03-31 DIAGNOSIS — R319 Hematuria, unspecified: Secondary | ICD-10-CM | POA: Diagnosis not present

## 2022-03-31 DIAGNOSIS — N138 Other obstructive and reflux uropathy: Secondary | ICD-10-CM

## 2022-03-31 LAB — URINALYSIS, COMPLETE
Bilirubin, UA: NEGATIVE
Glucose, UA: NEGATIVE
Ketones, UA: NEGATIVE
Leukocytes,UA: NEGATIVE
Nitrite, UA: NEGATIVE
Protein,UA: NEGATIVE
Specific Gravity, UA: 1.02 (ref 1.005–1.030)
Urobilinogen, Ur: 0.2 mg/dL (ref 0.2–1.0)
pH, UA: 6.5 (ref 5.0–7.5)

## 2022-03-31 LAB — MICROSCOPIC EXAMINATION: Bacteria, UA: NONE SEEN

## 2022-04-01 LAB — PSA: Prostate Specific Ag, Serum: 0.9 ng/mL (ref 0.0–4.0)

## 2022-04-24 ENCOUNTER — Other Ambulatory Visit: Payer: Self-pay | Admitting: Family Medicine

## 2022-04-24 DIAGNOSIS — E785 Hyperlipidemia, unspecified: Secondary | ICD-10-CM

## 2022-05-23 ENCOUNTER — Other Ambulatory Visit: Payer: Self-pay | Admitting: Family Medicine

## 2022-05-23 DIAGNOSIS — R319 Hematuria, unspecified: Secondary | ICD-10-CM

## 2022-05-31 ENCOUNTER — Other Ambulatory Visit: Payer: BC Managed Care – PPO

## 2022-05-31 DIAGNOSIS — R319 Hematuria, unspecified: Secondary | ICD-10-CM

## 2022-05-31 LAB — URINALYSIS, COMPLETE
Bilirubin, UA: NEGATIVE
Glucose, UA: NEGATIVE
Ketones, UA: NEGATIVE
Leukocytes,UA: NEGATIVE
Nitrite, UA: NEGATIVE
Protein,UA: NEGATIVE
Specific Gravity, UA: 1.025 (ref 1.005–1.030)
Urobilinogen, Ur: 0.2 mg/dL (ref 0.2–1.0)
pH, UA: 5.5 (ref 5.0–7.5)

## 2022-05-31 LAB — MICROSCOPIC EXAMINATION

## 2022-06-02 ENCOUNTER — Encounter: Payer: Self-pay | Admitting: Family Medicine

## 2022-09-04 ENCOUNTER — Encounter: Payer: Self-pay | Admitting: Family Medicine

## 2022-09-05 ENCOUNTER — Encounter: Payer: Self-pay | Admitting: Family Medicine

## 2022-09-05 ENCOUNTER — Ambulatory Visit: Payer: BC Managed Care – PPO | Admitting: Family Medicine

## 2022-09-05 VITALS — BP 122/72 | HR 60 | Temp 97.8°F | Resp 16 | Ht 70.0 in | Wt 224.9 lb

## 2022-09-05 DIAGNOSIS — Z5181 Encounter for therapeutic drug level monitoring: Secondary | ICD-10-CM

## 2022-09-05 DIAGNOSIS — R634 Abnormal weight loss: Secondary | ICD-10-CM | POA: Diagnosis not present

## 2022-09-05 DIAGNOSIS — M25562 Pain in left knee: Secondary | ICD-10-CM | POA: Diagnosis not present

## 2022-09-05 DIAGNOSIS — M2550 Pain in unspecified joint: Secondary | ICD-10-CM

## 2022-09-05 DIAGNOSIS — M1A09X0 Idiopathic chronic gout, multiple sites, without tophus (tophi): Secondary | ICD-10-CM

## 2022-09-05 MED ORDER — ALLOPURINOL 100 MG PO TABS: 100.0000 mg | ORAL_TABLET | Freq: Every day | ORAL | 1 refills | Status: DC

## 2022-09-05 MED ORDER — INDOMETHACIN 25 MG PO CAPS
ORAL_CAPSULE | ORAL | 3 refills | Status: AC
Start: 2022-09-05 — End: ?

## 2022-09-05 NOTE — Progress Notes (Signed)
Patient ID: Melvin Hatfield, male    DOB: 07-04-1970, 52 y.o.   MRN: [id number]  PCP: Danelle Berry, PA-C  Chief Complaint  Patient presents with   Gout    Left foot around the heel, onset for 2-3 days    Subjective:   Melvin Hatfield is a 52 y.o. male, presents to clinic with CC of the following:  HPI   Gout flare with recent diet changes, Q about getting back on allopurinol He stopped it earlier this year after not noting any day to day sx changes    Testosterone - heasks about checking it due to weight loss and decreased lean muscle mass on his scans/measurements - no other concerns/sx  No prior labs, he asks to have testosterone checked No results found for: "TESTOSTERONE"    Also hip and knee pain after doing some stretches and squats- he was trying to losten up his hips and stressed his knee it popped outside left knee, he also strained groin Left knee is still hurting x months, feels like it may give out   Patient Active Problem List   Diagnosis Date Noted   Major depressive disorder, recurrent episode, in full remission (HCC) 12/05/2021   Myalgia 12/05/2021   Borderline blood pressure 12/05/2021   Chronic gout of multiple sites 12/05/2021   Depression 11/27/2017   Enlarged tonsils 10/25/2017   History of depression 10/25/2017   Nasal septal deviation 10/25/2017   Obesity (BMI 30.0-34.9) 10/16/2017   Muscle fasciculation 08/17/2016   Medication monitoring encounter 08/17/2016   Leg cramps 03/01/2016   Skin lesion of right upper extremity 01/12/2015   Contusion of toenail 01/12/2015   Insomnia 01/12/2015   Esophagitis, reflux 01/12/2015   Rib pain on left side 01/12/2015   Left renal mass 10/06/2014   Nephrolithiasis 10/06/2014   Left nephrolithiasis 08/17/2014   Drug-induced skin rash 08/17/2014   GERD without esophagitis 07/09/2014   OSA on CPAP 07/09/2014   Cervical spine disease 07/09/2014   ADD (attention deficit disorder) without hyperactivity  07/09/2014   Hyperlipidemia LDL goal <70 07/09/2014   History of myocardial infarct at age less than 60 years 07/09/2014   Coronary artery disease involving native coronary artery without angina pectoris 07/09/2014   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 07/09/2014   Major depressive disorder, recurrent episode, in partial remission (HCC) 07/09/2014   PAF (paroxysmal atrial fibrillation) (HCC) 06/16/2014      Current Outpatient Medications:    APPLE CIDER VINEGAR PO, Take by mouth daily., Disp: , Rfl:    aspirin 81 MG chewable tablet, Chew 81 mg by mouth., Disp: , Rfl:    hydrochlorothiazide (HYDRODIURIL) 25 MG tablet, Take 1 tablet by mouth daily., Disp: , Rfl:    Misc Natural Products (TART CHERRY ADVANCED) CAPS, Take by mouth daily., Disp: , Rfl:    Multiple Vitamin (MULTIVITAMIN ADULT PO), Take by mouth daily., Disp: , Rfl:    omeprazole (PRILOSEC) 20 MG capsule, Take 1 capsule (20 mg total) by mouth daily., Disp: 30 capsule, Rfl: 3   rosuvastatin (CRESTOR) 5 MG tablet, Take 5 mg by mouth daily., Disp: , Rfl:    sertraline (ZOLOFT) 100 MG tablet, Take 1 tablet (100 mg total) by mouth daily., Disp: 90 tablet, Rfl: 3   ticagrelor (BRILINTA) 90 MG TABS tablet, Take 1 tablet (90 mg total) by mouth 2 (two) times daily., Disp: 60 tablet, Rfl: 0   No Known Allergies   Social History   Tobacco Use  Smoking status: Never   Smokeless tobacco: Never  Vaping Use   Vaping status: Never Used  Substance Use Topics   Alcohol use: Yes    Comment: 1-2 drinks per month   Drug use: No      Chart Review Today: I personally reviewed active problem list, medication list, allergies, family history, social history, health maintenance, notes from last encounter, lab results, imaging with the patient/caregiver today.   Review of Systems  Constitutional: Negative.   HENT: Negative.    Eyes: Negative.   Respiratory: Negative.    Cardiovascular: Negative.   Gastrointestinal:  Negative.   Endocrine: Negative.   Genitourinary: Negative.   Musculoskeletal: Negative.   Skin: Negative.   Allergic/Immunologic: Negative.   Neurological: Negative.   Hematological: Negative.   Psychiatric/Behavioral: Negative.    All other systems reviewed and are negative.      Objective:   Vitals:   09/05/22 1043  BP: 122/72  Pulse: 60  Resp: 16  Temp: 97.8 F (36.6 C)  TempSrc: Oral  SpO2: 96%  Weight: 224 lb 14.4 oz (102 kg)  Height: 5\' 10"  (1.778 m)    Body mass index is 32.27 kg/m.  Physical Exam Vitals and nursing note reviewed.  Constitutional:      Appearance: He is well-developed.  HENT:     Head: Normocephalic and atraumatic.     Nose: Nose normal.  Eyes:     General:        Right eye: No discharge.        Left eye: No discharge.     Conjunctiva/sclera: Conjunctivae normal.  Neck:     Trachea: No tracheal deviation.  Cardiovascular:     Rate and Rhythm: Normal rate and regular rhythm.  Pulmonary:     Effort: Pulmonary effort is normal. No respiratory distress.     Breath sounds: No stridor.  Musculoskeletal:        General: Normal range of motion.     Left ankle: No swelling or deformity.     Left Achilles Tendon: No tenderness.  Skin:    General: Skin is warm and dry.     Findings: No rash.  Neurological:     Mental Status: He is alert.     Motor: No abnormal muscle tone.     Coordination: Coordination normal.  Psychiatric:        Behavior: Behavior normal.      Results for orders placed or performed in visit on 05/31/22  Microscopic Examination   Urine  Result Value Ref Range   WBC, UA 0-5 0 - 5 /hpf   RBC, Urine 0-2 0 - 2 /hpf   Epithelial Cells (non renal) 0-10 0 - 10 /hpf   Bacteria, UA Few None seen/Few  Urinalysis, Complete  Result Value Ref Range   Specific Gravity, UA 1.025 1.005 - 1.030   pH, UA 5.5 5.0 - 7.5   Color, UA Yellow Yellow   Appearance Ur Clear Clear   Leukocytes,UA Negative Negative   Protein,UA  Negative Negative/Trace   Glucose, UA Negative Negative   Ketones, UA Negative Negative   RBC, UA Trace (A) Negative   Bilirubin, UA Negative Negative   Urobilinogen, Ur 0.2 0.2 - 1.0 mg/dL   Nitrite, UA Negative Negative   Microscopic Examination See below:        Assessment & Plan:     ICD-10-CM   1. Chronic gout of multiple sites, unspecified cause  M1A.09X0 hydrochlorothiazide (HYDRODIURIL) 25 MG tablet  COMPLETE METABOLIC PANEL WITH GFR    Uric acid    allopurinol (ZYLOPRIM) 100 MG tablet    Uric acid    indomethacin (INDOCIN) 25 MG capsule   recheck uric acid and restart allopurinol with goal to get and keep below 6.0    2. Polyarthralgia  M25.50    multiple joint complaints - past CRP SED rate ANA normal    3. Weight loss  R63.4 Testosterone    Testosterone   intentional with drastic diet changes Pt wants testosterone checked - I explained I don't know if it will be covered by insurance He is concerned with intentional weight loss that decreased his lean muscle mass with very drastic diet changes and working out heavily.  I explained the rapid weight loss can always burn lean muscle so slow weight loss is recommended and sustainable healthy diet changes explained that he will need to check testosterone in the morning and do it several times and if he has low testosterone he would need to consult with specialist to see if it was safe or an option to replace testosterone with his cardiac history Has no complaints of hair loss, decreased libido ED or fatigue    4. Acute pain of left knee  M25.562 Ambulatory referral to Orthopedics   left lateral knee pain and instability x months after deep stretching for hips causing knee to pop, he would like ortho consult    5. Encounter for medication monitoring  Z51.81 COMPLETE METABOLIC PANEL WITH GFR    Uric acid    Uric acid    Testosterone     Return for virtual f/up in 6 to 8 wks gout/labs/med changes.      Danelle Berry,  PA-C 09/05/22 10:49 AM

## 2022-09-27 ENCOUNTER — Other Ambulatory Visit: Payer: Self-pay | Admitting: Family Medicine

## 2022-09-27 DIAGNOSIS — M1A09X0 Idiopathic chronic gout, multiple sites, without tophus (tophi): Secondary | ICD-10-CM

## 2022-10-10 LAB — URIC ACID: Uric Acid: 5.6 mg/dL (ref 3.8–8.4)

## 2022-10-18 ENCOUNTER — Telehealth: Payer: BC Managed Care – PPO | Admitting: Family Medicine

## 2022-10-19 ENCOUNTER — Telehealth: Payer: BC Managed Care – PPO | Admitting: Nurse Practitioner

## 2022-10-19 ENCOUNTER — Other Ambulatory Visit: Payer: Self-pay

## 2022-10-19 ENCOUNTER — Encounter: Payer: Self-pay | Admitting: Nurse Practitioner

## 2022-10-19 DIAGNOSIS — M1A09X0 Idiopathic chronic gout, multiple sites, without tophus (tophi): Secondary | ICD-10-CM

## 2022-10-19 MED ORDER — ALLOPURINOL 100 MG PO TABS: 100.0000 mg | ORAL_TABLET | Freq: Every day | ORAL | 1 refills | Status: DC

## 2022-10-19 NOTE — Progress Notes (Signed)
Name: Melvin Hatfield   MRN: [id number]    DOB: [birth year]   Date:10/19/2022       Progress Note  Subjective  Chief Complaint  Chief Complaint  Patient presents with   Gout    I connected with  Melvin Hatfield  on 10/19/22 at  1:20 PM EDT by a video enabled telemedicine application and verified that I am speaking with the correct person using two identifiers.  I discussed the limitations of evaluation and management by telemedicine and the availability of in person appointments. The patient expressed understanding and agreed to proceed with the virtual visit  Staff also discussed with the patient that there may be a patient responsible charge related to this service. Patient Location: home Provider Location: [facility] Additional Individuals present: alone  HPI  Gout follow up:  patient seen on 09/05/2022 by Danelle Berry PA.  Patient had reported a gout flare onset 2-3 days in his left heel. Patient was restarted on allopurinol 100 mg daily and indomethacin 50 mg three times a day as needed. Patent uric acid was 7.2 on 09/05/2022 and is down to 5.6 on 10/09/2022. Patient reports he is doing well.  Has started a carnivore diet. Patient reports he knows it is not good for the gout but he is trying to lose weight.  Will continue with allopurinol refill sent.  Patient Active Problem List   Diagnosis Date Noted   Major depressive disorder, recurrent episode, in full remission (HCC) 12/05/2021   Myalgia 12/05/2021   Borderline blood pressure 12/05/2021   Chronic gout of multiple sites 12/05/2021   Depression 11/27/2017   Enlarged tonsils 10/25/2017   History of depression 10/25/2017   Nasal septal deviation 10/25/2017   Obesity (BMI 30.0-34.9) 10/16/2017   Muscle fasciculation 08/17/2016   Medication monitoring encounter 08/17/2016   Leg cramps 03/01/2016   Skin lesion of right upper extremity 01/12/2015   Contusion of toenail 01/12/2015   Insomnia 01/12/2015   Esophagitis, reflux 01/12/2015   Rib  pain on left side 01/12/2015   Left renal mass 10/06/2014   Nephrolithiasis 10/06/2014   Left nephrolithiasis 08/17/2014   Drug-induced skin rash 08/17/2014   GERD without esophagitis 07/09/2014   OSA on CPAP 07/09/2014   Cervical spine disease 07/09/2014   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   Hyperlipidemia LDL goal <70 07/09/2014   History of myocardial infarct at age less than 60 years 07/09/2014   Coronary artery disease involving native coronary artery without angina pectoris 07/09/2014   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  Approx 2007   TYMPANOSTOMY TUBE PLACEMENT     patient states he has had 6    Family History  Problem Relation Age of Onset   Cancer Mother    Depression Mother    Stroke Maternal Grandmother    Stroke Maternal Grandfather    Kidney disease Neg Hx    Prostate cancer Neg Hx     Social History   Socioeconomic  History   Marital status: Single    Spouse name: Not on file   Number of children: Not on file   Years of education: Not on file   Highest education level: Some college, no degree  Occupational History   Not on file  Tobacco Use   Smoking status: Never   Smokeless tobacco: Never  Vaping Use   Vaping status: Never Used  Substance and Sexual Activity   Alcohol use: Yes    Comment: 1-2 drinks per month   Drug use: No   Sexual activity: Never  Other Topics Concern    Not on file  Social History Narrative   Not on file   Social Determinants of Health   Financial Resource Strain: Low Risk  (09/04/2022)   Overall Financial Resource Strain (CARDIA)    Difficulty of Paying Living Expenses: Not very hard  Food Insecurity: No Food Insecurity (09/04/2022)   Hunger Vital Sign    Worried About Running Out of Food in the Last Year: Never true    Ran Out of Food in the Last Year: Never true  Transportation Needs: No Transportation Needs (09/04/2022)   PRAPARE - Administrator, Civil Service (Medical): No    Lack of Transportation (Non-Medical): No  Physical Activity: Sufficiently Active (09/04/2022)   Exercise Vital Sign    Days of Exercise per Week: 6 days    Minutes of Exercise per Session: 50 min  Stress: Stress Concern Present (09/04/2022)   [employer] of Occupational Health - Occupational Stress Questionnaire    Feeling of Stress : To some extent  Social Connections: Socially Isolated (09/04/2022)   Social Connection and Isolation Panel [NHANES]    Frequency of Communication with Friends and Family: More than three times a week    Frequency of Social Gatherings with Friends and Family: Three times a week    Attends Religious Services: Never    Active Member of Clubs or Organizations: No    Attends Engineer, structural: Not on file    Marital Status: Divorced  Intimate Partner Violence: Not on file     Current Outpatient Medications:    allopurinol (ZYLOPRIM) 100 MG tablet, Take 1 tablet (100 mg total) by mouth daily., Disp: 30 tablet, Rfl: 1   APPLE CIDER VINEGAR PO, Take by mouth daily., Disp: , Rfl:    aspirin 81 MG chewable tablet, Chew 81 mg by mouth., Disp: , Rfl:    hydrochlorothiazide (HYDRODIURIL) 25 MG tablet, Take 1 tablet by mouth daily., Disp: , Rfl:    indomethacin (INDOCIN) 25 MG capsule, Take 50 mg (2 tabs) PO TID prn at first onset of gout x 2 days then decrease to 25 mg (1 tab) po TID x 2 d, Disp: 18  capsule, Rfl: 3   Misc Natural Products (TART CHERRY ADVANCED) CAPS, Take by mouth daily., Disp: , Rfl:    Multiple Vitamin (MULTIVITAMIN ADULT PO), Take by mouth daily., Disp: , Rfl:    rosuvastatin (CRESTOR) 5 MG tablet, Take 5 mg by mouth daily., Disp: , Rfl:    sertraline (ZOLOFT) 100 MG tablet, Take 1 tablet (100 mg total) by mouth daily., Disp: 90 tablet, Rfl: 3   ticagrelor (BRILINTA) 90 MG TABS tablet, Take 1 tablet (90 mg total) by mouth 2 (two) times daily., Disp: 60 tablet, Rfl: 0   omeprazole (PRILOSEC) 20 MG capsule, Take 1 capsule (20 mg total) by mouth daily. (Patient not taking: Reported on 10/19/2022), Disp: 30 capsule, Rfl: 3  No Known Allergies  I personally reviewed active problem list, medication list, allergies, notes from last encounter with the patient/caregiver today.   ROS Constitutional: Negative for fever or weight change.  Respiratory: Negative for cough and shortness of breath.   Cardiovascular: Negative for chest pain or palpitations.  Gastrointestinal: Negative for abdominal pain, no bowel changes.  Musculoskeletal: Negative for gait problem or joint swelling.  Skin: Negative for rash.  Neurological: Negative for dizziness or headache.  No other specific complaints in a complete review of systems (except as listed in HPI above).   Objective  Virtual encounter, vitals not obtained.  There is no height or weight on file to calculate BMI.  Physical Exam Awake, alert and oriented, speaking in complete sentences  No results found for this or any previous visit (from the past 72 hour(s)).  PHQ2/9:    10/19/2022   12:51 PM 09/05/2022   10:43 AM 12/05/2021    3:25 PM 11/03/2020    9:24 AM 06/24/2019   10:00 AM  Depression screen PHQ 2/9  Decreased Interest 0 0 0 0 0  Down, Depressed, Hopeless 0 0 0 0 0  PHQ - 2 Score 0 0 0 0 0  Altered sleeping 0 0 0 0 0  Tired, decreased energy 0 0 0 0 0  Change in appetite 0 0 0 0 0  Feeling bad or failure about  yourself  0 0 0 0 0  Trouble concentrating 0 0 0 0 0  Moving slowly or fidgety/restless 0 0 0 0 0  Suicidal thoughts 0 0 0 0 0  PHQ-9 Score 0 0 0 0 0  Difficult doing work/chores Not difficult at all Not difficult at all  Not difficult at all Not difficult at all   PHQ-2/9 Result is negative.    Fall Risk:    10/19/2022   12:50 PM 09/05/2022   10:43 AM 12/05/2021    3:25 PM 11/03/2020    9:24 AM 06/24/2019    9:59 AM  Fall Risk   Falls in the past year? 0 0 0 0 0  Number falls in past yr: 0 0 0 0 0  Injury with Fall? 0 0 0 0 0  Risk for fall due to : No Fall Risks No Fall Risks     Follow up Falls prevention discussed Falls prevention discussed;Education provided;Falls evaluation completed Falls evaluation completed Falls evaluation completed      Assessment & Plan  1. Chronic gout of multiple sites, unspecified cause Refill sent, doing well - allopurinol (ZYLOPRIM) 100 MG tablet; Take 1 tablet (100 mg total) by mouth daily.  Dispense: 90 tablet; Refill: 1   I discussed the assessment and treatment plan with the patient. The patient was provided an opportunity to ask questions and all were answered. The patient agreed with the plan and demonstrated an understanding of the instructions.  The patient was advised to call back or seek an in-person evaluation if the symptoms worsen or if the condition fails to improve as anticipated.  I provided 20 minutes of non-face-to-face time during this encounter.

## 2022-10-23 ENCOUNTER — Telehealth: Payer: BC Managed Care – PPO | Admitting: Family Medicine

## 2022-11-08 ENCOUNTER — Encounter: Payer: Self-pay | Admitting: Family Medicine

## 2022-11-08 DIAGNOSIS — R030 Elevated blood-pressure reading, without diagnosis of hypertension: Secondary | ICD-10-CM

## 2022-11-08 DIAGNOSIS — G4733 Obstructive sleep apnea (adult) (pediatric): Secondary | ICD-10-CM

## 2022-11-25 ENCOUNTER — Other Ambulatory Visit: Payer: Self-pay | Admitting: Nurse Practitioner

## 2022-11-25 DIAGNOSIS — F3342 Major depressive disorder, recurrent, in full remission: Secondary | ICD-10-CM

## 2022-11-27 NOTE — Telephone Encounter (Signed)
Requested Prescriptions  Pending Prescriptions Disp Refills   sertraline (ZOLOFT) 100 MG tablet [Pharmacy Med Name: Sertraline HCl 100 MG Oral Tablet] 90 tablet 0    Sig: Take 1 tablet by mouth once daily     Psychiatry:  Antidepressants - SSRI - sertraline Passed - 11/25/2022  7:01 AM      Passed - AST in normal range and within 360 days    AST  Date Value Ref Range Status  09/05/2022 23 10 - 35 U/L Final         Passed - ALT in normal range and within 360 days    ALT  Date Value Ref Range Status  09/05/2022 27 9 - 46 U/L Final         Passed - Completed PHQ-2 or PHQ-9 in the last 360 days      Passed - Valid encounter within last 6 months    Recent Outpatient Visits           1 month ago Chronic gout of multiple sites, unspecified cause   [facility] Della Goo F, FNP   2 months ago Chronic gout of multiple sites, unspecified cause   [facility] Danelle Berry, PA-C   11 months ago Major depressive disorder, recurrent episode, in full remission [facility])   [facility] Della Goo F, FNP   2 years ago Major depressive disorder, recurrent episode, in full remission [facility])   [facility] Berniece Salines, FNP   3 years ago Insomnia, unspecified type   [facility] Danelle Berry, [place]

## 2023-02-23 ENCOUNTER — Other Ambulatory Visit: Payer: Self-pay | Admitting: Nurse Practitioner

## 2023-02-23 DIAGNOSIS — F3342 Major depressive disorder, recurrent, in full remission: Secondary | ICD-10-CM

## 2023-02-23 NOTE — Telephone Encounter (Signed)
Patient will need to schedule a follow up visit for additional refills. Requested Prescriptions  Pending Prescriptions Disp Refills   sertraline (ZOLOFT) 100 MG tablet [Pharmacy Med Name: Sertraline HCl 100 MG Oral Tablet] 90 tablet 0    Sig: Take 1 tablet by mouth once daily     Psychiatry:  Antidepressants - SSRI - sertraline Passed - 02/23/2023  3:09 PM      Passed - AST in normal range and within 360 days    AST  Date Value Ref Range Status  09/05/2022 23 10 - 35 U/L Final         Passed - ALT in normal range and within 360 days    ALT  Date Value Ref Range Status  09/05/2022 27 9 - 46 U/L Final         Passed - Completed PHQ-2 or PHQ-9 in the last 360 days      Passed - Valid encounter within last 6 months    Recent Outpatient Visits           4 months ago Chronic gout of multiple sites, unspecified cause   [facility] Della Goo F, FNP   5 months ago Chronic gout of multiple sites, unspecified cause   [facility] Danelle Berry, PA-C   1 year ago Major depressive disorder, recurrent episode, in full remission [facility])   [facility] Della Goo F, FNP   2 years ago Major depressive disorder, recurrent episode, in full remission [facility])   [facility] Berniece Salines, FNP   3 years ago Insomnia, unspecified type   [facility] Danelle Berry, [place]

## 2023-03-20 ENCOUNTER — Encounter: Payer: Self-pay | Admitting: Family Medicine

## 2023-04-29 ENCOUNTER — Other Ambulatory Visit: Payer: Self-pay | Admitting: Nurse Practitioner

## 2023-04-29 DIAGNOSIS — M1A09X0 Idiopathic chronic gout, multiple sites, without tophus (tophi): Secondary | ICD-10-CM

## 2023-05-01 NOTE — Telephone Encounter (Signed)
 Requested Prescriptions  Pending Prescriptions Disp Refills   allopurinol (ZYLOPRIM) 100 MG tablet [Pharmacy Med Name: ALLOPURINOL 100 MG TABLET] 90 tablet 0    Sig: TAKE 1 TABLET BY MOUTH EVERY DAY     Endocrinology:  Gout Agents - allopurinol Failed - 05/01/2023  9:12 AM      Failed - Valid encounter within last 12 months    Recent Outpatient Visits   None            Failed - CBC within normal limits and completed in the last 12 months    WBC  Date Value Ref Range Status  12/05/2021 6.0 3.8 - 10.8 Thousand/uL Final   RBC  Date Value Ref Range Status  12/05/2021 4.84 4.20 - 5.80 Million/uL Final   Hemoglobin  Date Value Ref Range Status  12/05/2021 14.8 13.2 - 17.1 g/dL Final   HCT  Date Value Ref Range Status  12/05/2021 42.9 38.5 - 50.0 % Final   MCHC  Date Value Ref Range Status  12/05/2021 34.5 32.0 - 36.0 g/dL Final   [facility]  Date Value Ref Range Status  12/05/2021 30.6 27.0 - 33.0 pg Final   MCV  Date Value Ref Range Status  12/05/2021 88.6 80.0 - 100.0 fL Final   No results found for: "PLTCOUNTKUC", "LABPLAT", "POCPLA" RDW  Date Value Ref Range Status  12/05/2021 13.3 11.0 - 15.0 % Final         Passed - Uric Acid in normal range and within 360 days    Uric Acid  Date Value Ref Range Status  10/09/2022 5.6 3.8 - 8.4 mg/dL Final    Comment:               Therapeutic target for gout patients: <6.0         Passed - Cr in normal range and within 360 days    Creat  Date Value Ref Range Status  09/05/2022 1.22 0.70 - 1.30 mg/dL Final

## 2023-05-11 ENCOUNTER — Ambulatory Visit: Admitting: Family Medicine

## 2023-05-11 ENCOUNTER — Encounter: Payer: Self-pay | Admitting: Family Medicine

## 2023-05-11 VITALS — BP 116/78 | HR 52 | Temp 97.7°F | Resp 18 | Ht 70.0 in | Wt 213.3 lb

## 2023-05-11 DIAGNOSIS — F3342 Major depressive disorder, recurrent, in full remission: Secondary | ICD-10-CM | POA: Diagnosis not present

## 2023-05-11 DIAGNOSIS — G4733 Obstructive sleep apnea (adult) (pediatric): Secondary | ICD-10-CM | POA: Diagnosis not present

## 2023-05-11 DIAGNOSIS — R1032 Left lower quadrant pain: Secondary | ICD-10-CM

## 2023-05-11 MED ORDER — SERTRALINE HCL 100 MG PO TABS: 100.0000 mg | ORAL_TABLET | Freq: Every day | ORAL | 3 refills | Status: AC

## 2023-05-11 NOTE — Progress Notes (Signed)
 Patient ID: Melvin Hatfield, male    DOB: 07/08/1970, 53 y.o.   MRN: [id number]  PCP: Adeline Hone, PA-C  Chief Complaint  Patient presents with   Groin Pain    Left side was working out doing squats last summer had bad pain, went to urgent care and everything checked out.  States still hurting and something is wrong?   Dizziness    Episodes of lightheaded states really can't explain it gets flashes and feels like brain is not getting enough oxygen?    Subjective:   Melvin Hatfield is a 53 y.o. male, presents to clinic with CC of the following:  HPI  Left sided groin injury/issue pop and felt something shorten, occurred with deep squat with lifting  Still hurts, left pubic bone down to left groin upper left inner thigh, hurts with coughing He did go to a specialist for evaluation (Ortho?) he reports an x-ray was done and since that time he has had no improvement  Dizzy episodes do not extensively discussed today  Did follow-up on obstructive sleep apnea and CPAP follow-up but she has not done   Patient Active Problem List   Diagnosis Date Noted   Major depressive disorder, recurrent episode, in full remission (HCC) 12/05/2021   Myalgia 12/05/2021   Borderline blood pressure 12/05/2021   Chronic gout of multiple sites 12/05/2021   Depression 11/27/2017   Enlarged tonsils 10/25/2017   History of depression 10/25/2017   Nasal septal deviation 10/25/2017   Obesity (BMI 30.0-34.9) 10/16/2017   Muscle fasciculation 08/17/2016   Medication monitoring encounter 08/17/2016   Leg cramps 03/01/2016   Skin lesion of right upper extremity 01/12/2015   Contusion of toenail 01/12/2015   Insomnia 01/12/2015   Esophagitis, reflux 01/12/2015   Rib pain on left side 01/12/2015   Left renal mass 10/06/2014   Nephrolithiasis 10/06/2014   Left nephrolithiasis 08/17/2014   Drug-induced skin rash 08/17/2014   GERD without esophagitis 07/09/2014   OSA on CPAP 07/09/2014   Cervical spine disease  07/09/2014   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   Hyperlipidemia LDL goal <70 07/09/2014   History of myocardial infarct at age less than 60 years 07/09/2014   Coronary artery disease involving native coronary artery without angina pectoris 07/09/2014   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 07/09/2014   Major depressive disorder, recurrent episode, in partial remission (HCC) 07/09/2014   PAF (paroxysmal atrial fibrillation) (HCC) 06/16/2014      Current Outpatient Medications:    allopurinol  (ZYLOPRIM ) 100 MG tablet, TAKE 1 TABLET BY MOUTH EVERY DAY, Disp: 90 tablet, Rfl: 0   APPLE CIDER VINEGAR PO, Take by mouth daily., Disp: , Rfl:    aspirin 81 MG chewable tablet, Chew 81 mg by mouth., Disp: , Rfl:    hydrochlorothiazide (HYDRODIURIL) 25 MG tablet, Take 1 tablet by mouth daily., Disp: , Rfl:    indomethacin  (INDOCIN ) 25 MG capsule, Take 50 mg (2 tabs) PO TID prn at first onset of gout x 2 days then decrease to 25 mg (1 tab) po TID x 2 d, Disp: 18 capsule, Rfl: 3   Misc Natural Products (TART CHERRY ADVANCED) CAPS, Take by mouth daily., Disp: , Rfl:    Multiple Vitamin (MULTIVITAMIN ADULT PO), Take by mouth daily., Disp: , Rfl:    rosuvastatin  (CRESTOR ) 5 MG tablet, Take 5 mg by mouth daily., Disp: , Rfl:    sertraline  (ZOLOFT ) 100 MG tablet, Take 1 tablet by mouth once daily,  Disp: 90 tablet, Rfl: 0   ticagrelor  (BRILINTA ) 90 MG TABS tablet, Take 1 tablet (90 mg total) by mouth 2 (two) times daily., Disp: 60 tablet, Rfl: 0   No Known Allergies   Social History   Tobacco Use   Smoking status: Never   Smokeless tobacco: Never  Vaping Use   Vaping status: Never Used  Substance Use Topics   Alcohol use: Yes    Comment: 1-2 drinks per month   Drug use: No      Chart Review Today: I personally reviewed active problem list, medication list, allergies, family history, social history, health maintenance, notes from last encounter, lab  results, imaging with the patient/caregiver today.   Review of Systems  Constitutional: Negative.   HENT: Negative.    Eyes: Negative.   Respiratory: Negative.    Cardiovascular: Negative.   Gastrointestinal: Negative.   Endocrine: Negative.   Genitourinary: Negative.  Negative for penile swelling, scrotal swelling and testicular pain.  Musculoskeletal: Negative.   Skin: Negative.   Allergic/Immunologic: Negative.   Neurological: Negative.   Hematological: Negative.   Psychiatric/Behavioral: Negative.    All other systems reviewed and are negative.      Objective:   Vitals:   05/11/23 1454  BP: 116/78  Pulse: (!) 52  Resp: 18  Temp: 97.7 F (36.5 C)  Weight: 213 lb 4.8 oz (96.8 kg)  Height: 5\' 10"  (1.778 m)    Body mass index is 30.61 kg/m.  Physical Exam Vitals and nursing note reviewed. Exam conducted with a chaperone present.  Constitutional:      Appearance: He is well-developed.  HENT:     Head: Normocephalic and atraumatic.     Nose: Nose normal.  Eyes:     General:        Right eye: No discharge.        Left eye: No discharge.     Conjunctiva/sclera: Conjunctivae normal.  Neck:     Trachea: No tracheal deviation.  Cardiovascular:     Rate and Rhythm: Normal rate and regular rhythm.  Pulmonary:     Effort: Pulmonary effort is normal. No respiratory distress.     Breath sounds: No stridor.  Genitourinary:    Penis: Normal and circumcised.      Testes:        Left: Tenderness or swelling not present.     Epididymis:     Left: Normal.  Musculoskeletal:        General: Normal range of motion.  Lymphadenopathy:     Lower Body: No left inguinal (but some ttp) adenopathy.  Skin:    General: Skin is warm and dry.     Findings: No rash.  Neurological:     Mental Status: He is alert.     Motor: No abnormal muscle tone.     Coordination: Coordination normal.  Psychiatric:        Behavior: Behavior normal.      Results for orders placed or  performed in visit on 09/05/22  COMPLETE METABOLIC PANEL WITH GFR   Collection Time: 09/05/22 11:22 AM  Result Value Ref Range   Glucose, Bld 99 65 - 99 mg/dL   BUN 29 (H) 7 - 25 mg/dL   Creat 1.61 0.96 - 0.45 mg/dL   eGFR 72 > OR = 60 WU/JWJ/1.91Y7   BUN/Creatinine Ratio 24 (H) 6 - 22 (calc)   Sodium 141 135 - 146 mmol/L   Potassium 3.7 3.5 - 5.3 mmol/L   Chloride 102  98 - 110 mmol/L   CO2 30 20 - 32 mmol/L   Calcium  9.7 8.6 - 10.3 mg/dL   Total Protein 6.7 6.1 - 8.1 g/dL   Albumin 4.5 3.6 - 5.1 g/dL   Globulin 2.2 1.9 - 3.7 g/dL (calc)   AG Ratio 2.0 1.0 - 2.5 (calc)   Total Bilirubin 0.8 0.2 - 1.2 mg/dL   Alkaline phosphatase (APISO) 60 35 - 144 U/L   AST 23 10 - 35 U/L   ALT 27 9 - 46 U/L  Uric acid   Collection Time: 09/05/22 11:22 AM  Result Value Ref Range   Uric Acid, Serum 7.2 4.0 - 8.0 mg/dL  Testosterone    Collection Time: 10/09/22  8:09 AM  Result Value Ref Range   Testosterone  492 264 - 916 ng/dL  Uric acid   Collection Time: 10/09/22  8:09 AM  Result Value Ref Range   Uric Acid 5.6 3.8 - 8.4 mg/dL       Assessment & Plan:     1. OSA on CPAP (Primary) Not sure about compliance, we previously did new referral but he never got connected to local sleep med - gave him info again today on AVS, may need newer referral ordered since it was 6 months ago - not sure if this could be part of his dizzy sx?  2. Left groin pain No obvious hernia on exam but tender when examining him - hx could be consistent with inguinal hernia - imaging to further eval Other MSK dx also possible He went to ortho already and xrays were neg, no other tx or follow up Possible adductor rupture? Tear?  Explained her should f/up with ortho if still no improvement and further eval/imaging for that may include US  or MRI?  Really defer to specialist - but offered other referrals if he was not satisfied - possible referrals PT, sports medicine MD or new ortho consult - CT ABDOMEN PELVIS W  CONTRAST  3. Major depressive disorder, recurrent episode, in full remission (HCC) He asked for med refill at end of visit but visit was not done today for routine f/up - more for acute complaints - so refills were ordered per his request but screenings and hx typically done were not done today - see phq9 old    10/19/2022   12:51 PM 09/05/2022   10:43 AM 12/05/2021    3:25 PM  Depression screen PHQ 2/9  Decreased Interest 0 0 0  Down, Depressed, Hopeless 0 0 0  PHQ - 2 Score 0 0 0  Altered sleeping 0 0 0  Tired, decreased energy 0 0 0  Change in appetite 0 0 0  Feeling bad or failure about yourself  0 0 0  Trouble concentrating 0 0 0  Moving slowly or fidgety/restless 0 0 0  Suicidal thoughts 0 0 0  PHQ-9 Score 0 0 0  Difficult doing work/chores Not difficult at all Not difficult at all   - sertraline  (ZOLOFT ) 100 MG tablet; Take 1 tablet (100 mg total) by mouth daily.  Dispense: 90 tablet; Refill: 3     Adeline Hone, PA-C 05/11/23 3:01 PM

## 2023-05-11 NOTE — Patient Instructions (Addendum)
 Call central scheduling [phone]  Call this in 1-2 weeks to follow up on the CT if you haven't heard anything about getting it scheduled.  [facility])  [place]: [phone]  Release ID # [id number]  [facility] in [place] for OSA and CPAP management: [facility]: [address], [place], [place]  [address]

## 2023-05-16 ENCOUNTER — Ambulatory Visit
Admission: RE | Admit: 2023-05-16 | Discharge: 2023-05-16 | Disposition: A | Discharge status: Home / Self Care | Source: Ambulatory Visit | Attending: Family Medicine | Admitting: Family Medicine

## 2023-05-16 ENCOUNTER — Encounter: Payer: Self-pay | Admitting: Family Medicine

## 2023-05-16 DIAGNOSIS — R1032 Left lower quadrant pain: Secondary | ICD-10-CM | POA: Diagnosis present

## 2023-05-16 MED ORDER — IOHEXOL 300 MG/ML  SOLN
100.0000 mL | Freq: Once | INTRAMUSCULAR | Status: AC | PRN
  Administered 2023-05-16: 100 mL via INTRAVENOUS

## 2023-05-28 ENCOUNTER — Encounter: Payer: Self-pay | Admitting: Family Medicine

## 2023-05-31 ENCOUNTER — Encounter: Payer: Self-pay | Admitting: Family Medicine

## 2023-07-29 ENCOUNTER — Other Ambulatory Visit: Payer: Self-pay | Admitting: Nurse Practitioner

## 2023-07-29 DIAGNOSIS — M1A09X0 Idiopathic chronic gout, multiple sites, without tophus (tophi): Secondary | ICD-10-CM

## 2023-07-31 NOTE — Telephone Encounter (Signed)
 Requested medication (s) are due for refill today: yes  Requested medication (s) are on the active medication list: yes  Last refill:  05/01/23 #90  Future visit scheduled: no  Notes to clinic:  overdue lab work   Requested Prescriptions  Pending Prescriptions Disp Refills   allopurinol  (ZYLOPRIM ) 100 MG tablet [Pharmacy Med Name: ALLOPURINOL  100 MG TABLET] 90 tablet 0    Sig: TAKE 1 TABLET BY MOUTH EVERY DAY     Endocrinology:  Gout Agents - allopurinol  Failed - 07/31/2023  3:13 PM      Failed - CBC within normal limits and completed in the last 12 months    WBC  Date Value Ref Range Status  12/05/2021 6.0 3.8 - 10.8 Thousand/uL Final   RBC  Date Value Ref Range Status  12/05/2021 4.84 4.20 - 5.80 Million/uL Final   Hemoglobin  Date Value Ref Range Status  12/05/2021 14.8 13.2 - 17.1 g/dL Final   HCT  Date Value Ref Range Status  12/05/2021 42.9 38.5 - 50.0 % Final   MCHC  Date Value Ref Range Status  12/05/2021 34.5 32.0 - 36.0 g/dL Final   [facility]  Date Value Ref Range Status  12/05/2021 30.6 27.0 - 33.0 pg Final   MCV  Date Value Ref Range Status  12/05/2021 88.6 80.0 - 100.0 fL Final   No results found for: PLTCOUNTKUC, LABPLAT, POCPLA RDW  Date Value Ref Range Status  12/05/2021 13.3 11.0 - 15.0 % Final         Passed - Uric Acid in normal range and within 360 days    Uric Acid  Date Value Ref Range Status  10/09/2022 5.6 3.8 - 8.4 mg/dL Final    Comment:               Therapeutic target for gout patients: <6.0         Passed - Cr in normal range and within 360 days    Creat  Date Value Ref Range Status  09/05/2022 1.22 0.70 - 1.30 mg/dL Final         Passed - Valid encounter within last 12 months    Recent Outpatient Visits           2 months ago OSA on CPAP   [facility] Leavy Mole, PA-C
